# Patient Record
Sex: Male | Born: 1950 | Race: White | Hispanic: No | Marital: Married | Smoking: Never smoker
Health system: Southern US, Community
[De-identification: ages and names within clinical notes are randomized; demographics above are authoritative.]

## PROBLEM LIST (undated history)

## (undated) DIAGNOSIS — Z5189 Encounter for other specified aftercare: Secondary | ICD-10-CM

## (undated) DIAGNOSIS — R748 Abnormal levels of other serum enzymes: Secondary | ICD-10-CM

## (undated) DIAGNOSIS — I509 Heart failure, unspecified: Secondary | ICD-10-CM

## (undated) DIAGNOSIS — C61 Malignant neoplasm of prostate: Secondary | ICD-10-CM

## (undated) DIAGNOSIS — I429 Cardiomyopathy, unspecified: Secondary | ICD-10-CM

## (undated) DIAGNOSIS — R0683 Snoring: Secondary | ICD-10-CM

## (undated) DIAGNOSIS — I2510 Atherosclerotic heart disease of native coronary artery without angina pectoris: Secondary | ICD-10-CM

## (undated) DIAGNOSIS — I10 Essential (primary) hypertension: Secondary | ICD-10-CM

## (undated) DIAGNOSIS — I252 Old myocardial infarction: Secondary | ICD-10-CM

## (undated) DIAGNOSIS — Z872 Personal history of diseases of the skin and subcutaneous tissue: Secondary | ICD-10-CM

## (undated) DIAGNOSIS — K9289 Other specified diseases of the digestive system: Secondary | ICD-10-CM

## (undated) DIAGNOSIS — L910 Hypertrophic scar: Secondary | ICD-10-CM

## (undated) DIAGNOSIS — E785 Hyperlipidemia, unspecified: Secondary | ICD-10-CM

## (undated) DIAGNOSIS — I213 ST elevation (STEMI) myocardial infarction of unspecified site: Secondary | ICD-10-CM

## (undated) DIAGNOSIS — N529 Male erectile dysfunction, unspecified: Secondary | ICD-10-CM

## (undated) DIAGNOSIS — D225 Melanocytic nevi of trunk: Secondary | ICD-10-CM

## (undated) DIAGNOSIS — D649 Anemia, unspecified: Secondary | ICD-10-CM

## (undated) DIAGNOSIS — L570 Actinic keratosis: Secondary | ICD-10-CM

## (undated) DIAGNOSIS — D229 Melanocytic nevi, unspecified: Secondary | ICD-10-CM

## (undated) DIAGNOSIS — I499 Cardiac arrhythmia, unspecified: Secondary | ICD-10-CM

## (undated) DIAGNOSIS — Z950 Presence of cardiac pacemaker: Secondary | ICD-10-CM

## (undated) HISTORY — DX: Anemia, unspecified: D64.9

## (undated) HISTORY — DX: Actinic keratosis: L57.0

## (undated) HISTORY — DX: Melanocytic nevi, unspecified: D22.9

## (undated) HISTORY — DX: Melanocytic nevi of trunk: D22.5

## (undated) HISTORY — DX: Cardiac arrhythmia, unspecified: I49.9

## (undated) HISTORY — DX: Male erectile dysfunction, unspecified: N52.9

## (undated) HISTORY — PX: ICD IMPLANT: EP1208

## (undated) HISTORY — DX: Hyperlipidemia, unspecified: E78.5

## (undated) HISTORY — DX: Encounter for other specified aftercare: Z51.89

## (undated) HISTORY — DX: Cardiomyopathy, unspecified: I42.9

## (undated) HISTORY — DX: Atherosclerotic heart disease of native coronary artery without angina pectoris: I25.10

## (undated) HISTORY — DX: Hypertrophic scar: L91.0

## (undated) HISTORY — DX: Essential (primary) hypertension: I10

## (undated) HISTORY — DX: Heart failure, unspecified: I50.9

## (undated) HISTORY — DX: Abnormal levels of other serum enzymes: R74.8

---
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
## 2009-11-12 DIAGNOSIS — Z8546 Personal history of malignant neoplasm of prostate: Secondary | ICD-10-CM | POA: Insufficient documentation

## 2011-02-10 DIAGNOSIS — E559 Vitamin D deficiency, unspecified: Secondary | ICD-10-CM | POA: Insufficient documentation

## 2011-03-07 DIAGNOSIS — C61 Malignant neoplasm of prostate: Secondary | ICD-10-CM

## 2011-03-07 HISTORY — PX: PROSTATECTOMY: SHX69

## 2011-03-07 HISTORY — DX: Malignant neoplasm of prostate: C61

## 2013-04-16 DIAGNOSIS — M109 Gout, unspecified: Secondary | ICD-10-CM | POA: Insufficient documentation

## 2014-03-06 DIAGNOSIS — I213 ST elevation (STEMI) myocardial infarction of unspecified site: Secondary | ICD-10-CM

## 2014-03-06 HISTORY — DX: ST elevation (STEMI) myocardial infarction of unspecified site: I21.3

## 2014-09-10 DIAGNOSIS — Z8674 Personal history of sudden cardiac arrest: Secondary | ICD-10-CM | POA: Insufficient documentation

## 2014-09-14 DIAGNOSIS — I10 Essential (primary) hypertension: Secondary | ICD-10-CM | POA: Insufficient documentation

## 2014-09-14 DIAGNOSIS — Z955 Presence of coronary angioplasty implant and graft: Secondary | ICD-10-CM | POA: Insufficient documentation

## 2015-02-14 DIAGNOSIS — D649 Anemia, unspecified: Secondary | ICD-10-CM | POA: Insufficient documentation

## 2015-08-25 DIAGNOSIS — E785 Hyperlipidemia, unspecified: Secondary | ICD-10-CM | POA: Insufficient documentation

## 2015-08-25 DIAGNOSIS — I429 Cardiomyopathy, unspecified: Secondary | ICD-10-CM | POA: Insufficient documentation

## 2015-08-25 DIAGNOSIS — I5021 Acute systolic (congestive) heart failure: Secondary | ICD-10-CM | POA: Insufficient documentation

## 2015-08-25 DIAGNOSIS — I2510 Atherosclerotic heart disease of native coronary artery without angina pectoris: Secondary | ICD-10-CM | POA: Insufficient documentation

## 2015-10-13 DIAGNOSIS — Z95810 Presence of automatic (implantable) cardiac defibrillator: Secondary | ICD-10-CM | POA: Insufficient documentation

## 2016-11-20 DIAGNOSIS — R739 Hyperglycemia, unspecified: Secondary | ICD-10-CM | POA: Insufficient documentation

## 2017-03-09 DIAGNOSIS — M109 Gout, unspecified: Secondary | ICD-10-CM

## 2017-03-09 HISTORY — DX: Gout, unspecified: M10.9

## 2018-08-24 DIAGNOSIS — D1800 Hemangioma unspecified site: Secondary | ICD-10-CM

## 2018-08-24 HISTORY — DX: Hemangioma unspecified site: D18.00

## 2019-03-05 ENCOUNTER — Telehealth: Payer: Self-pay

## 2019-03-05 NOTE — Telephone Encounter (Signed)
NOTES ON FILE FROM DR Gerrit Halls MD [phone]

## 2019-05-08 DIAGNOSIS — L910 Hypertrophic scar: Secondary | ICD-10-CM | POA: Insufficient documentation

## 2019-05-08 NOTE — Progress Notes (Signed)
Cardiology Office Note:    Date:  05/09/2019   ID:  [name], DOB [birth year], MRN [id number]  PCP:  Patient, No Pcp Per  Cardiologist:  No primary care provider on file.   Referring MD: No ref. provider found   Chief Complaint  Patient presents with  . Coronary Artery Disease  . Congestive Heart Failure    History of Present Illness:    Kurt Reilly is a 69 y.o. male with a hx of CAD stent OM/Cfx 05/2015, Anterior MI 07/2015 with 100% LAD and cardiogenic shock, chronic systolic heart failure, ischemic cardiomyopathy, cardiac arrest VF 07/2014, 07/2015, ICD (subcutaneous 2017), hyperlipidemia, hypertension, and erectile dysfunction.   Acute anterior ST elevation MI treated with stent Jul 30, 2014 in the setting of ventricular fibrillation and cardiogenic shock.  Cardiac cath June 2016 all nonculprit complex circumflex disease and ostial LAD disease as a staged procedure.  Additional stents were placed in the LAD and throughout the circumflex territory using resolute drug-eluting stents.  Procedure was complicated by cardiogenic shock and required mechanical support.  Subsequent echocardiogram revealed LVEF less than 35% in December 2017 leading to subcu ICD.  Most recent echocardiogram from March 2019 revealed improvement in EF to 40 to 45%  Overall he is happy with his quality of life.  He denies angina.  He is following a low-salt low fluid diet.  He wonders about whether he should still remain on Brilinta.  He has no bleeding complications.  He denies orthopnea, PND, and lower extremity edema.  He does have occasional trace pedal swelling.  He sleeps on a wedge but feels he can sleep flat without any difficulty.  He has never had therapy from his subcutaneous AICD.  He complains about a purplish discoloration of his left great toe.  At times it feels cool.  No sore or other complaint.  He exercises 1 hour 5 days a week.  No difficulty with leg pain or weakness.  Past Medical History:    Diagnosis Date  . Actinic keratosis   . Alkaline phosphatase raised   . Anemia   . CAD (coronary artery disease)   . Cardiac arrhythmia   . Cardiomyopathy ([place])   . CHF (congestive heart failure) ([place])   . Coronary arteriosclerosis   . ED (erectile dysfunction)   . Gastrointestinal irritation   . Gout 03/09/2017  . Hemangioma 08/24/2018  . History of actinic keratosis   . History of myocardial infarction   . Hyperlipidemia   . Hypertension   . Hypertrophic scar   . Malignant neoplasm of prostate ([place])   . Melanocytic nevi of trunk   . Melanocytic nevus of skin   . Pacemaker   . Senile hyperkeratosis   . Snoring   . STEMI (ST elevation myocardial infarction) [facility])     Past Surgical History:  Procedure Laterality Date  . ICD IMPLANT      Current Medications: Current Meds  Medication Sig  . Alirocumab (PRALUENT) 150 MG/ML SOAJ Inject into the skin.  Marland Kitchen allopurinol (ZYLOPRIM) 300 MG tablet Take 300 mg by mouth daily.  . Aspirin Buf,CaCarb-MgCarb-MgO, 81 MG TABS Take 81 mg by mouth daily.  . B Complex-C (B-COMPLEX WITH VITAMIN C) tablet Take 1 tablet by mouth daily.  . carvedilol (COREG) 6.25 MG tablet Take 6.25 mg by mouth 2 (two) times daily with a meal.  . Cholecalciferol (VITAMIN D-3) 25 MCG (1000 UT) CAPS Take by mouth.  . Colchicine (MITIGARE) 0.6 MG CAPS Take 0.6 mg by  mouth as needed.  . ferrous sulfate 325 (65 FE) MG tablet Take 325 mg by mouth daily with breakfast.  . losartan (COZAAR) 50 MG tablet Take 50 mg by mouth daily.  Marland Kitchen MILK THISTLE PO Take 525 mg by mouth.  . nitroGLYCERIN (NITROSTAT) 0.4 MG SL tablet Place 0.4 mg under the tongue every 5 (five) minutes as needed for chest pain.  Marland Kitchen spironolactone (ALDACTONE) 25 MG tablet Take 25 mg by mouth daily.  . ticagrelor (BRILINTA) 60 MG TABS tablet Take by mouth every 12 (twelve) hours.     Allergies:   Ezetimibe, Statins, and Penicillins   Social History   Socioeconomic History  . Marital status: Married     Spouse name: Not on file  . Number of children: Not on file  . Years of education: Not on file  . Highest education level: Not on file  Occupational History  . Not on file  Tobacco Use  . Smoking status: Never Smoker  . Smokeless tobacco: Never Used  Substance and Sexual Activity  . Alcohol use: Yes  . Drug use: Not on file  . Sexual activity: Not on file  Other Topics Concern  . Not on file  Social History Narrative  . Not on file   Social Determinants of Health   Financial Resource Strain:   . Difficulty of Paying Living Expenses: Not on file  Food Insecurity:   . Worried About Charity fundraiser in the Last Year: Not on file  . Ran Out of Food in the Last Year: Not on file  Transportation Needs:   . Lack of Transportation (Medical): Not on file  . Lack of Transportation (Non-Medical): Not on file  Physical Activity:   . Days of Exercise per Week: Not on file  . Minutes of Exercise per Session: Not on file  Stress:   . Feeling of Stress : Not on file  Social Connections:   . Frequency of Communication with Friends and Family: Not on file  . Frequency of Social Gatherings with Friends and Family: Not on file  . Attends Religious Services: Not on file  . Active Member of Clubs or Organizations: Not on file  . Attends Archivist Meetings: Not on file  . Marital Status: Not on file     Family History: The patient's family history includes Cancer in his father; High Cholesterol in his father; Hypertension in his father; Migraines in his son; Yves Dill Parkinson White syndrome in his son.  ROS:   Please see the history of present illness.    He moved to [place] because he has children that live in [place].  He has enjoyed being here.  He is originally from [place].  After having his defibrillator placed he retired from work as an Clinical biochemist.  All other systems reviewed and are negative.  EKGs/Labs/Other Studies Reviewed:    The  following studies were reviewed today:  ECHOCARDIOGRAM 2017:  LVEF 40 to 45% December 2017 with anteroseptal wall motion abnormality.    Mild mitral regurgitation.  Aortic valve sclerosis  udy were communicated with the ordering physician at 10:12AM, 09/02/2014  EKG:  EKG on 05/09/2019, there is sinus bradycardia, poor R wave progression V1 through V4, decreased limb lead voltage.  No acute ST-T wave change and no prior tracing to compare.  Recent Labs: No results found for requested labs within last 8760 hours.  Recent Lipid Panel No results found for: CHOL, TRIG, HDL, CHOLHDL, VLDL, LDLCALC, LDLDIRECT  Physical Exam:    VS:  BP 128/68   Pulse (!) 56   Ht 5\' 6"  (1.676 m)   Wt 160 lb 12.8 oz (72.9 kg)   SpO2 97%   BMI 25.95 kg/m     Wt Readings from Last 3 Encounters:  05/09/19 160 lb 12.8 oz (72.9 kg)     GEN: Wearing a mask.  Not overweight.. No acute distress HEENT: Normal NECK: No JVD. LYMPHATICS: No lymphadenopathy CARDIAC: 1/6 to 2/6 right upper sternal border systolic murmur RRR without diastolic murmur, gallop, or edema. VASCULAR: Posterior tibial pulses are intact.   RESPIRATORY:  Clear to auscultation without rales, wheezing or rhonchi  ABDOMEN: Soft, non-tender, non-distended, No pulsatile mass, MUSCULOSKELETAL: No deformity  SKIN: Warm and dry NEUROLOGIC:  Alert and oriented x 3 PSYCHIATRIC:  Normal affect   ASSESSMENT:    1. Chronic systolic heart failure ([place])   2. Coronary artery disease involving native coronary artery of native heart without angina pectoris   3. ICD (implantable cardioverter-defibrillator), single, in situ   4. Essential hypertension   5. Hyperlipidemia LDL goal <70   6. Cardiomyopathy, ischemic   7. Educated about COVID-19 virus infection   8. Toe cyanosis    PLAN:    In order of problems listed above:  1. Most recently reported EF is 40 to 45%.  Last study to assess LV function was 2017.  He is on  carvedilol/losartan/spironolactone.  We need to repeat an echo to see what current LV function exists. 2. Secondary prevention discussed. 3. We will set up with an EP within our practice. 4. 130/80 mmHg or less is the target.  This is being achieved on current therapy as noted above.  This should be continued. Low-salt diet and exercise advocated. 5. DL less than 70.  He is on Praluent which will be continued and he will be referred to the lipid clinic 6. Assess LVEF 7. COVID-19 vaccine is endorsed.  Social distancing, mask wearing, and handwashing should continue. 8. Bilateral lower extremity Doppler study to exclude PAD given discoloration in the left great toe.  No overt evidence of claudication on exam. 9. Continue Praluent.  Set up in the lipid clinic with our Pharm.D.'s.  Overall education and awareness concerning primary/secondary risk prevention was discussed in detail: LDL less than 70, hemoglobin A1c less than 7, blood pressure target less than 130/80 mmHg, >150 minutes of moderate aerobic activity per week, avoidance of smoking, weight control (via diet and exercise), and continued surveillance/management of/for obstructive sleep apnea.     Medication Adjustments/Labs and Tests Ordered: Current medicines are reviewed at length with the patient today.  Concerns regarding medicines are outlined above.  Orders Placed This Encounter  Procedures  . Ambulatory referral to Cardiac Electrophysiology  . AMB Referral to [facility]  . MYOCARDIAL PERFUSION IMAGING  . EKG 12-Lead  . ECHOCARDIOGRAM COMPLETE  . VAS [place] LOWER EXTREMITY ARTERIAL DUPLEX   No orders of the defined types were placed in this encounter.   Patient Instructions  Medication Instructions:  Your physician recommends that you continue on your current medications as directed. Please refer to the Current Medication list given to you today.  *If you need a refill on your cardiac medications before  your next appointment, please call your pharmacy*   Lab Work: None If you have labs (blood work) drawn today and your tests are completely normal, you will receive your results only by: Marland Kitchen MyChart Message (if you have MyChart)  OR . A paper copy in the mail If you have any lab test that is abnormal or we need to change your treatment, we will call you to review the results.   Testing/Procedures: Your physician has requested that you have an echocardiogram. Echocardiography is a painless test that uses sound waves to create images of your heart. It provides your doctor with information about the size and shape of your heart and how well your heart's chambers and valves are working. This procedure takes approximately one hour. There are no restrictions for this procedure.  Your physician has requested that you have a lexiscan myoview. For further information please visit [email or web address]. Please follow instruction sheet, as given.  Your physician has requested that you have a lower extremity arterial duplex. This test is an ultrasound of the arteries in the legs or arms. It looks at arterial blood flow in the legs and arms. Allow one hour for Lower Arterial scans. There are no restrictions or special instructions    Follow-Up: At [facility], you and your health needs are our priority.  As part of our continuing mission to provide you with exceptional heart care, we have created designated Provider Care Teams.  These Care Teams include your primary Cardiologist (physician) and Advanced Practice Providers (APPs -  Physician Assistants and Nurse Practitioners) who all work together to provide you with the care you need, when you need it.  We recommend signing up for the patient portal called "MyChart".  Sign up information is provided on this After Visit Summary.  MyChart is used to connect with patients for Virtual Visits (Telemedicine).  Patients are able to view lab/test results, encounter  notes, upcoming appointments, etc.  Non-urgent messages can be sent to your provider as well.   To learn more about what you can do with MyChart, go to [email or web address].    Your next appointment:   6 month(s)  The format for your next appointment:   In Person  Provider:   You may see Dr. Daneen Schick or one of the following Advanced Practice Providers on your designated Care Team:    Truitt Merle, NP  Cecilie Kicks, NP  Kathyrn Drown, NP    Other Instructions      Signed, Sinclair Grooms, MD  05/09/2019 11:15 AM    [place]

## 2019-05-09 ENCOUNTER — Other Ambulatory Visit: Payer: Self-pay | Admitting: Interventional Cardiology

## 2019-05-09 ENCOUNTER — Encounter: Payer: Self-pay | Admitting: *Deleted

## 2019-05-09 ENCOUNTER — Encounter: Payer: Self-pay | Admitting: Interventional Cardiology

## 2019-05-09 ENCOUNTER — Ambulatory Visit: Payer: 59 | Admitting: Interventional Cardiology

## 2019-05-09 ENCOUNTER — Other Ambulatory Visit: Payer: Self-pay

## 2019-05-09 VITALS — BP 128/68 | HR 56 | Ht 66.0 in | Wt 160.8 lb

## 2019-05-09 DIAGNOSIS — I5022 Chronic systolic (congestive) heart failure: Secondary | ICD-10-CM

## 2019-05-09 DIAGNOSIS — I2510 Atherosclerotic heart disease of native coronary artery without angina pectoris: Secondary | ICD-10-CM

## 2019-05-09 DIAGNOSIS — Z7189 Other specified counseling: Secondary | ICD-10-CM

## 2019-05-09 DIAGNOSIS — Z95810 Presence of automatic (implantable) cardiac defibrillator: Secondary | ICD-10-CM

## 2019-05-09 DIAGNOSIS — R230 Cyanosis: Secondary | ICD-10-CM

## 2019-05-09 DIAGNOSIS — E785 Hyperlipidemia, unspecified: Secondary | ICD-10-CM

## 2019-05-09 DIAGNOSIS — I10 Essential (primary) hypertension: Secondary | ICD-10-CM | POA: Diagnosis not present

## 2019-05-09 DIAGNOSIS — I255 Ischemic cardiomyopathy: Secondary | ICD-10-CM

## 2019-05-09 NOTE — Patient Instructions (Signed)
Medication Instructions:  Your physician recommends that you continue on your current medications as directed. Please refer to the Current Medication list given to you today.  *If you need a refill on your cardiac medications before your next appointment, please call your pharmacy*   Lab Work: None If you have labs (blood work) drawn today and your tests are completely normal, you will receive your results only by: Marland Kitchen MyChart Message (if you have MyChart) OR . A paper copy in the mail If you have any lab test that is abnormal or we need to change your treatment, we will call you to review the results.   Testing/Procedures: Your physician has requested that you have an echocardiogram. Echocardiography is a painless test that uses sound waves to create images of your heart. It provides your doctor with information about the size and shape of your heart and how well your heart's chambers and valves are working. This procedure takes approximately one hour. There are no restrictions for this procedure.  Your physician has requested that you have a lexiscan myoview. For further information please visit [email or web address]. Please follow instruction sheet, as given.  Your physician has requested that you have a lower extremity arterial duplex. This test is an ultrasound of the arteries in the legs or arms. It looks at arterial blood flow in the legs and arms. Allow one hour for Lower Arterial scans. There are no restrictions or special instructions    Follow-Up: At [facility], you and your health needs are our priority.  As part of our continuing mission to provide you with exceptional heart care, we have created designated Provider Care Teams.  These Care Teams include your primary Cardiologist (physician) and Advanced Practice Providers (APPs -  Physician Assistants and Nurse Practitioners) who all work together to provide you with the care you need, when you need it.  We recommend signing up  for the patient portal called "MyChart".  Sign up information is provided on this After Visit Summary.  MyChart is used to connect with patients for Virtual Visits (Telemedicine).  Patients are able to view lab/test results, encounter notes, upcoming appointments, etc.  Non-urgent messages can be sent to your provider as well.   To learn more about what you can do with MyChart, go to [email or web address].    Your next appointment:   6 month(s)  The format for your next appointment:   In Person  Provider:   You may see Dr. Daneen Schick or one of the following Advanced Practice Providers on your designated Care Team:    Truitt Merle, NP  Cecilie Kicks, NP  Kathyrn Drown, NP    Other Instructions

## 2019-05-13 ENCOUNTER — Ambulatory Visit: Payer: 59 | Admitting: Internal Medicine

## 2019-05-13 ENCOUNTER — Encounter: Payer: Self-pay | Admitting: Internal Medicine

## 2019-05-13 ENCOUNTER — Other Ambulatory Visit: Payer: Self-pay

## 2019-05-13 VITALS — BP 128/68 | HR 65 | Ht 66.0 in | Wt 152.0 lb

## 2019-05-13 DIAGNOSIS — I469 Cardiac arrest, cause unspecified: Secondary | ICD-10-CM | POA: Insufficient documentation

## 2019-05-13 DIAGNOSIS — I5022 Chronic systolic (congestive) heart failure: Secondary | ICD-10-CM | POA: Diagnosis not present

## 2019-05-13 DIAGNOSIS — Z95810 Presence of automatic (implantable) cardiac defibrillator: Secondary | ICD-10-CM | POA: Diagnosis not present

## 2019-05-13 DIAGNOSIS — I428 Other cardiomyopathies: Secondary | ICD-10-CM | POA: Insufficient documentation

## 2019-05-13 DIAGNOSIS — I4901 Ventricular fibrillation: Secondary | ICD-10-CM | POA: Insufficient documentation

## 2019-05-13 DIAGNOSIS — I255 Ischemic cardiomyopathy: Secondary | ICD-10-CM | POA: Diagnosis not present

## 2019-05-13 LAB — CUP PACEART INCLINIC DEVICE CHECK
Date Time Interrogation Session: 20210309094243
Implantable Lead Implant Date: 20170727
Implantable Lead Location: 753862
Implantable Lead Model: 3401
Implantable Pulse Generator Implant Date: 20170727
Pulse Gen Serial Number: 203970

## 2019-05-13 NOTE — Progress Notes (Signed)
HPI Kurt Reilly is referred today for ongoing evaluation and management of his S-ICD. He is a pleasant 69 yo man with a VF arrest in the setting of an AMI in 2016. He has had an amazing recovery. His EF was initially in the 30% range but has increased into the 40% range. He feels well. He walks 1 hour daily. He has not had an ICD shock. He describes want sounds like a single episode of CHF.  Allergies  Allergen Reactions  . Ezetimibe Other (See Comments)  . Statins Other (See Comments)    Myalgia  . Penicillins Rash    Occurred as a child - tolerated Zosyn June 2016      Current Outpatient Medications  Medication Sig Dispense Refill  . Alirocumab (PRALUENT) 150 MG/ML SOAJ Inject into the skin.    Marland Kitchen allopurinol (ZYLOPRIM) 300 MG tablet Take 300 mg by mouth daily.    . Aspirin Buf,CaCarb-MgCarb-MgO, 81 MG TABS Take 81 mg by mouth daily.    . B Complex-C (B-COMPLEX WITH VITAMIN C) tablet Take 1 tablet by mouth daily.    . carvedilol (COREG) 6.25 MG tablet Take 6.25 mg by mouth 2 (two) times daily with a meal.    . Cholecalciferol (VITAMIN D-3) 25 MCG (1000 UT) CAPS Take by mouth.    . Colchicine (MITIGARE) 0.6 MG CAPS Take 0.6 mg by mouth as needed.    . ferrous sulfate 325 (65 FE) MG tablet Take 325 mg by mouth daily with breakfast.    . losartan (COZAAR) 50 MG tablet Take 50 mg by mouth daily.    Marland Kitchen MILK THISTLE PO Take 525 mg by mouth.    . nitroGLYCERIN (NITROSTAT) 0.4 MG SL tablet Place 0.4 mg under the tongue every 5 (five) minutes as needed for chest pain.    Marland Kitchen spironolactone (ALDACTONE) 25 MG tablet Take 25 mg by mouth daily.    . ticagrelor (BRILINTA) 60 MG TABS tablet Take by mouth every 12 (twelve) hours.     No current facility-administered medications for this visit.     Past Medical History:  Diagnosis Date  . Actinic keratosis   . Alkaline phosphatase raised   . Anemia   . CAD (coronary artery disease)   . Cardiac arrhythmia   . Cardiomyopathy ([place])   . CHF  (congestive heart failure) ([place])   . Coronary arteriosclerosis   . ED (erectile dysfunction)   . Gastrointestinal irritation   . Gout 03/09/2017  . Hemangioma 08/24/2018  . History of actinic keratosis   . History of myocardial infarction   . Hyperlipidemia   . Hypertension   . Hypertrophic scar   . Malignant neoplasm of prostate ([place])   . Melanocytic nevi of trunk   . Melanocytic nevus of skin   . Pacemaker   . Senile hyperkeratosis   . Snoring   . STEMI (ST elevation myocardial infarction) ([place])     ROS:   All systems reviewed and negative except as noted in the HPI.   Past Surgical History:  Procedure Laterality Date  . ICD IMPLANT       Family History  Problem Relation Age of Onset  . Cancer Father        gastric  . Hypertension Father   . High Cholesterol Father   . Yves Dill Parkinson White syndrome Son   . Migraines Son      Social History   Socioeconomic History  . Marital status: Married  Spouse name: Not on file  . Number of children: Not on file  . Years of education: Not on file  . Highest education level: Not on file  Occupational History  . Not on file  Tobacco Use  . Smoking status: Never Smoker  . Smokeless tobacco: Never Used  Substance and Sexual Activity  . Alcohol use: Yes  . Drug use: Not on file  . Sexual activity: Not on file  Other Topics Concern  . Not on file  Social History Narrative  . Not on file   Social Determinants of Health   Financial Resource Strain:   . Difficulty of Paying Living Expenses: Not on file  Food Insecurity:   . Worried About Charity fundraiser in the Last Year: Not on file  . Ran Out of Food in the Last Year: Not on file  Transportation Needs:   . Lack of Transportation (Medical): Not on file  . Lack of Transportation (Non-Medical): Not on file  Physical Activity:   . Days of Exercise per Week: Not on file  . Minutes of Exercise per Session: Not on file  Stress:   . Feeling of Stress : Not on  file  Social Connections:   . Frequency of Communication with Friends and Family: Not on file  . Frequency of Social Gatherings with Friends and Family: Not on file  . Attends Religious Services: Not on file  . Active Member of Clubs or Organizations: Not on file  . Attends Archivist Meetings: Not on file  . Marital Status: Not on file  Intimate Partner Violence:   . Fear of Current or Ex-Partner: Not on file  . Emotionally Abused: Not on file  . Physically Abused: Not on file  . Sexually Abused: Not on file     BP 128/68   Pulse 65   Ht 5\' 6"  (1.676 m)   Wt 152 lb (68.9 kg)   SpO2 99%   BMI 24.53 kg/m   Physical Exam:  Well appearing NAD HEENT: Unremarkable Neck:  No JVD, no thyromegally Lymphatics:  No adenopathy Back:  No CVA tenderness Lungs:  Clear with no wheezes HEART:  Regular rate rhythm, no murmurs, no rubs, no clicks Abd:  soft, positive bowel sounds, no organomegally, no rebound, no guarding Ext:  2 plus pulses, no edema, no cyanosis, no clubbing Skin:  No rashes no nodules Neuro:  CN II through XII intact, motor grossly intact  DEVICE  Normal device function.  See PaceArt for details.   Assess/Plan: 1. Chronic systolic heart failure - his symptoms are class 2. He will continue his current meds.  2. CAD - he denies anginal symptoms and he is walking without chest pain. 3. S-ICD - his [employer] S-ICD is working normally. He has over a year of battery longevity and he has had no recurrent ventricular arrhythmias.  Mikle [email or web address].D.

## 2019-05-13 NOTE — Patient Instructions (Signed)
Medication Instructions:  Your physician recommends that you continue on your current medications as directed. Please refer to the Current Medication list given to you today.  Labwork: None ordered.  Testing/Procedures: None ordered.  Follow-Up: Your physician wants you to follow-up in: one year with Dr. Lovena Le.   You will receive a reminder letter in the mail two months in advance. If you don't receive a letter, please call our office to schedule the follow-up appointment.  Remote monitoring is used to monitor your ICD from home. This monitoring reduces the number of office visits required to check your device to one time per year. It allows [place] to keep an eye on the functioning of your device to ensure it is working properly. You are scheduled for a device check from home on 08/08/2019. You may send your transmission at any time that day. If you have a wireless device, the transmission will be sent automatically. After your physician reviews your transmission, you will receive a postcard with your next transmission date.  Any Other Special Instructions Will Be Listed Below (If Applicable).  If you need a refill on your cardiac medications before your next appointment, please call your pharmacy.

## 2019-05-14 ENCOUNTER — Ambulatory Visit (HOSPITAL_COMMUNITY): Payer: 59

## 2019-05-27 ENCOUNTER — Encounter (HOSPITAL_COMMUNITY): Payer: 59

## 2019-05-27 ENCOUNTER — Other Ambulatory Visit (HOSPITAL_COMMUNITY): Payer: 59

## 2019-05-27 ENCOUNTER — Ambulatory Visit: Payer: 59

## 2019-06-11 ENCOUNTER — Telehealth (HOSPITAL_COMMUNITY): Payer: Self-pay | Admitting: *Deleted

## 2019-06-11 ENCOUNTER — Encounter (HOSPITAL_COMMUNITY): Payer: Self-pay | Admitting: *Deleted

## 2019-06-11 NOTE — Telephone Encounter (Signed)
Patient given detailed instructions per Myocardial Perfusion Study Information Sheet for the test on 06/18/2019 at 0730. Patient notified to arrive 15 minutes early and that it is imperative to arrive on time for appointment to keep from having the test rescheduled.  If you need to cancel or reschedule your appointment, please call the office within 24 hours of your appointment. . Patient verbalized understanding.Kurt Reilly, [name] Mychart sent with instructions.

## 2019-06-17 ENCOUNTER — Other Ambulatory Visit: Payer: Self-pay | Admitting: Interventional Cardiology

## 2019-06-18 ENCOUNTER — Ambulatory Visit (INDEPENDENT_AMBULATORY_CARE_PROVIDER_SITE_OTHER): Payer: 59 | Admitting: Pharmacist

## 2019-06-18 ENCOUNTER — Encounter (HOSPITAL_COMMUNITY): Payer: 59

## 2019-06-18 ENCOUNTER — Encounter: Payer: Self-pay | Admitting: Pharmacist

## 2019-06-18 ENCOUNTER — Telehealth: Payer: Self-pay

## 2019-06-18 ENCOUNTER — Other Ambulatory Visit (HOSPITAL_COMMUNITY): Payer: 59

## 2019-06-18 ENCOUNTER — Other Ambulatory Visit: Payer: Self-pay

## 2019-06-18 DIAGNOSIS — E785 Hyperlipidemia, unspecified: Secondary | ICD-10-CM | POA: Diagnosis not present

## 2019-06-18 NOTE — Progress Notes (Signed)
Patient ID: Kurt Reilly                 DOB: Oct 24, 1950                    MRN: [id number]     HPI: Kurt Reilly is a 69 y.o. male patient referred to lipid clinic by Dr Tamala Julian. PMH is significant for hyperlipidemia,  CAD s/p stent placement, HFrEF of 35% on Dec/2017 imporved to 40% by March 2019 and hypertension. Patient report starting Praluent 150mg  over 2 years ago with last renewal Spring 2020 and LDL < 70mg /dL. No laboratory results available at this time. Noted failed trial to multiple statins including atorvastatin, rosuvastatin and simvastatin.  Current Medications:  Praluent 150mg  every 14 days  Intolerances: (Severe muscle pain ,sore legs and difficulty ambulating) Rosuvastatin 10mg  daily Atorvastatin simvastatin  LDL goal: < 70mg /dL  Diet: watching fat intake, salt intake and increased taker intake, amlost all out dairy  Exercise:  trail mill or stationary bike for 1 hr 6x/day  Family History: The patient's family history includes Cancer in his father; High Cholesterol in his father; Hypertension in his father; Migraines in his son; Yves Dill Parkinson White syndrome in his son.  Social History: denies tobacco use, occasional 1 or 2 glasses per week  Labs: non available today  Past Medical History:  Diagnosis Date  . Actinic keratosis   . Alkaline phosphatase raised   . Anemia   . CAD (coronary artery disease)   . Cardiac arrhythmia   . Cardiomyopathy ([place])   . CHF (congestive heart failure) ([place])   . Coronary arteriosclerosis   . ED (erectile dysfunction)   . Gastrointestinal irritation   . Gout 03/09/2017  . Hemangioma 08/24/2018  . History of actinic keratosis   . History of myocardial infarction   . Hyperlipidemia   . Hypertension   . Hypertrophic scar   . Malignant neoplasm of prostate ([place])   . Melanocytic nevi of trunk   . Melanocytic nevus of skin   . Pacemaker   . Senile hyperkeratosis   . Snoring   . STEMI (ST elevation myocardial infarction) [facility])      Current Outpatient Medications on File Prior to Visit  Medication Sig Dispense Refill  . Alirocumab (PRALUENT) 150 MG/ML SOAJ Inject into the skin.    Marland Kitchen allopurinol (ZYLOPRIM) 300 MG tablet Take 300 mg by mouth daily.    . Aspirin Buf,CaCarb-MgCarb-MgO, 81 MG TABS Take 81 mg by mouth daily.    . B Complex-C (B-COMPLEX WITH VITAMIN C) tablet Take 1 tablet by mouth daily.    . carvedilol (COREG) 6.25 MG tablet Take 6.25 mg by mouth 2 (two) times daily with a meal.    . Cholecalciferol (VITAMIN D-3) 25 MCG (1000 UT) CAPS Take by mouth.    . Colchicine (MITIGARE) 0.6 MG CAPS Take 0.6 mg by mouth as needed.    . ferrous sulfate 325 (65 FE) MG tablet Take 325 mg by mouth daily with breakfast.    . losartan (COZAAR) 50 MG tablet Take 50 mg by mouth daily.    Marland Kitchen MILK THISTLE PO Take 525 mg by mouth.    . nitroGLYCERIN (NITROSTAT) 0.4 MG SL tablet Place 0.4 mg under the tongue every 5 (five) minutes as needed for chest pain.    Marland Kitchen spironolactone (ALDACTONE) 25 MG tablet Take 25 mg by mouth daily.    . ticagrelor (BRILINTA) 60 MG TABS tablet Take by mouth every 12 (twelve) hours.  No current facility-administered medications on file prior to visit.    Allergies  Allergen Reactions  . Ezetimibe Other (See Comments)  . Statins Other (See Comments)    Myalgia  . Penicillins Rash    Occurred as a child - tolerated Zosyn June 2016     Hyperlipidemia LDL goal <70 Patient currently on Praluent therapy. Recently moved to [facility] from [place] and will need renewal for insurance in 1-2 months. Denies problems with current therapy and records LDL 70mg /dL last year, but no recent lab results provided.   Will continue Praleunt 150mg  every 14 days, and positive lifestyle modifications. Plan to repeat fasting lipid panel tomorrow while at our office @ [facility]. We will proceed with PA renewal once lipid panel resulted and follow up with patient as needed.   Eustace Hur Rodriguez-Guzman PharmD, BCPS, [place] [id number] 06/18/2019 2:22 PM

## 2019-06-18 NOTE — Patient Instructions (Addendum)
Lipid Clinic (pharmacist):  Kurt Reilly/Kristin/[place] [phone]  Your Results:             Goal  Total Cholesterol < 200  Triglycerides < 150  HDL (happy/good cholesterol) > 40  LDL (lousy/bad cholesterol < 70     Medication changes: Continue taking Praluent 150mg  every 14 days   Lab orders: Fasting blood work TOMORROW   Patient Assistance:  The [employer] foundation offers assistance to help pay for medication copays.  They will cover copays for all cholesterol lowering meds, including statins, fibrates, omega-3 oils, ezetimibe, Repatha, Praluent, Nexletol, Nexlizet.  The cards are usually good for $2,500 or 12 months, whichever comes first. 1. Go to healthwellfoundation.org 2. Click on "Apply Now" 3. Answer questions as to whom is applying (patient or representative) 4. Your disease fund will be "hypercholesterolemia - Medicare access" 5. They will ask questions about finances and which medications you are taking for cholesterol 6. When you submit, the approval is usually within minutes.  You will need to print the card information from the site 7. You will need to show this information to your pharmacy, they will bill your Medicare Part D plan first -then bill [employer] Well --for the copay.   You can also call them at [phone], although the hold times can be quite long.   Thank you for choosing [facility]

## 2019-06-18 NOTE — Assessment & Plan Note (Signed)
Patient currently on Praluent therapy. Recently moved to [facility] from [place] and will need renewal for insurance in 1-2 months. Denies problems with current therapy and records LDL 70mg /dL last year, but no recent lab results provided.   Will continue Praleunt 150mg  every 14 days, and positive lifestyle modifications. Plan to repeat fasting lipid panel tomorrow while at our office @ [facility]. We will proceed with PA renewal once lipid panel resulted and follow up with patient as needed.

## 2019-06-18 NOTE — Telephone Encounter (Signed)
Pt called and stated they had labs drawn at [place] lipid doctor [place]. They gave me their contact information and I called and lmomed the dr at [phone] to please fax [place] their lipid labs

## 2019-06-19 ENCOUNTER — Ambulatory Visit (HOSPITAL_COMMUNITY)
Admission: RE | Admit: 2019-06-19 | Discharge: 2019-06-19 | Disposition: A | Discharge status: Home / Self Care | Payer: 59 | Source: Ambulatory Visit | Attending: Cardiology | Admitting: Cardiology

## 2019-06-19 DIAGNOSIS — R230 Cyanosis: Secondary | ICD-10-CM

## 2019-06-23 ENCOUNTER — Telehealth: Payer: Self-pay

## 2019-06-23 DIAGNOSIS — E785 Hyperlipidemia, unspecified: Secondary | ICD-10-CM

## 2019-06-23 NOTE — Telephone Encounter (Signed)
Called to inform the pt that they were denied because the labs needed to be more recent will appeal once they get new lab work completed for cholesterol management.

## 2019-07-09 ENCOUNTER — Telehealth (HOSPITAL_COMMUNITY): Payer: Self-pay | Admitting: *Deleted

## 2019-07-09 NOTE — Telephone Encounter (Signed)
Left message on voicemail per DPR in reference to upcoming appointment scheduled on 07/14/19 with detailed instructions given per Myocardial Perfusion Study Information Sheet for the test. LM to arrive 15 minutes early, and that it is imperative to arrive on time for appointment to keep from having the test rescheduled. If you need to cancel or reschedule your appointment, please call the office within 24 hours of your appointment. Failure to do so may result in a cancellation of your appointment, and a $50 no show fee. Phone number given for call back for any questions. Kirstie Peri

## 2019-07-14 ENCOUNTER — Other Ambulatory Visit: Payer: Self-pay

## 2019-07-14 ENCOUNTER — Ambulatory Visit (HOSPITAL_COMMUNITY): Payer: 59 | Attending: Cardiovascular Disease

## 2019-07-14 ENCOUNTER — Ambulatory Visit (HOSPITAL_BASED_OUTPATIENT_CLINIC_OR_DEPARTMENT_OTHER): Payer: 59

## 2019-07-14 DIAGNOSIS — I2510 Atherosclerotic heart disease of native coronary artery without angina pectoris: Secondary | ICD-10-CM | POA: Diagnosis not present

## 2019-07-14 DIAGNOSIS — I5022 Chronic systolic (congestive) heart failure: Secondary | ICD-10-CM

## 2019-07-14 LAB — MYOCARDIAL PERFUSION IMAGING
LV dias vol: 165 mL (ref 62–150)
LV sys vol: 123 mL
Peak HR: 96 {beats}/min
Rest HR: 59 {beats}/min
SDS: 5
SRS: 23
SSS: 32
TID: 0.99

## 2019-07-14 LAB — ECHOCARDIOGRAM COMPLETE
Height: 66 in
Weight: 2512 oz

## 2019-07-14 MED ORDER — PERFLUTREN LIPID MICROSPHERE
1.0000 mL | INTRAVENOUS | Status: AC | PRN
  Administered 2019-07-14: 2 mL via INTRAVENOUS

## 2019-07-14 MED ORDER — TECHNETIUM TC 99M TETROFOSMIN IV KIT
9.9000 | PACK | Freq: Once | INTRAVENOUS | Status: AC | PRN
  Administered 2019-07-14: 9.9 via INTRAVENOUS
  Filled 2019-07-14: qty 10

## 2019-07-14 MED ORDER — TECHNETIUM TC 99M TETROFOSMIN IV KIT
30.6000 | PACK | Freq: Once | INTRAVENOUS | Status: AC | PRN
  Administered 2019-07-14: 30.6 via INTRAVENOUS
  Filled 2019-07-14: qty 31

## 2019-07-14 MED ORDER — REGADENOSON 0.4 MG/5ML IV SOLN
0.4000 mg | Freq: Once | INTRAVENOUS | Status: AC
  Administered 2019-07-14: 0.4 mg via INTRAVENOUS

## 2019-07-15 ENCOUNTER — Telehealth: Payer: Self-pay | Admitting: *Deleted

## 2019-07-15 ENCOUNTER — Telehealth: Payer: Self-pay

## 2019-07-15 DIAGNOSIS — I5022 Chronic systolic (congestive) heart failure: Secondary | ICD-10-CM

## 2019-07-15 MED ORDER — ENTRESTO 24-26 MG PO TABS: 1.0000 | ORAL_TABLET | Freq: Two times a day (BID) | ORAL | 11 refills | Status: DC

## 2019-07-15 NOTE — Telephone Encounter (Signed)
**Note De-Identified Kamaree Wheatley Obfuscation** I started a Entresto PA through covermymeds. Key: [id number]

## 2019-07-15 NOTE — Telephone Encounter (Signed)
Spoke with pt and wife and reviewed stress test and echo results and recommendations.  Pt agreeable to [facility].  He will come for labs on 07/24/19 and have an office visit on 5/26 with Truitt Merle, NP.  Pt aware to monitor BP and when appropriate to call.

## 2019-07-15 NOTE — Telephone Encounter (Signed)
-----  Message from Belva Crome, MD sent at 07/15/2019  8:49 AM EDT ----- Let the patient know his heart function is markedly decreased compared to the last recorded values from [place].  Ejection fraction is now 25%.  Medication adjustments are needed to preserve LV function.  Stop losartan and start Entresto 24/26 mg p.o. twice daily.  Be met should be performed 7 to 10 days later with an office visit.  This should be with me or an APP A copy will be sent to Patient, No Pcp Per

## 2019-07-21 ENCOUNTER — Other Ambulatory Visit: Payer: Self-pay

## 2019-07-21 DIAGNOSIS — I5022 Chronic systolic (congestive) heart failure: Secondary | ICD-10-CM

## 2019-07-21 DIAGNOSIS — E785 Hyperlipidemia, unspecified: Secondary | ICD-10-CM

## 2019-07-23 NOTE — Progress Notes (Signed)
CARDIOLOGY OFFICE NOTE  Date:  07/30/2019    Kurt Reilly Date of Birth: [birth year] Medical Record #[id number]  PCP:  Patient, No Pcp Per  Cardiologist:  Tamala Julian & Lovena Le   Chief Complaint  Patient presents with  . Follow-up    History of Present Illness: Kurt Reilly is a 69 y.o. male who presents today for a 3 1/2 month check. Seen for Dr. Tamala Julian and Dr. Lovena Le.   He has a known history of CAD with anterior MI/cardiac arrest in May 2016 with 100% LAD with PCI - had associated cardiogenic shock/failure to wean - then had staged PCI to LCX and ostial LAD in June 2016 (total of 2 stents to the LAD) [place], chronic systolic HF, ICM, underlying ICD (subcutaneous 2017), HLD, HTN and ED. EF was less than 35% - this has improved to 40 to 45%.   Last seen back in March by Dr. Tamala Julian - felt to be doing ok - did have a purplish discoloration of the left great toe. ABIs were ok.   The patient does not have symptoms concerning for COVID-19 infection (fever, chills, cough, or new shortness of breath).   Comes in today. Here alone. He says he is feeling great. No chest pain. Not short of breath. Little fatigue. Actually feels like he can think "clearer" with the change over to [facility]. No swelling. Watching his salt. No shocks. Needs PCP and needs urology. He is having issues with ED - has tried Viagra in the past - no real response - wonders if he should see urology - we can refer. Overall, he is happy with how he is doing.   Past Medical History:  Diagnosis Date  . Actinic keratosis   . Alkaline phosphatase raised   . Anemia   . CAD (coronary artery disease)   . Cardiac arrhythmia   . Cardiomyopathy ([place])   . CHF (congestive heart failure) ([place])   . Coronary arteriosclerosis   . ED (erectile dysfunction)   . Gastrointestinal irritation   . Gout 03/09/2017  . Hemangioma 08/24/2018  . History of actinic keratosis   . History of myocardial infarction   . Hyperlipidemia   .  Hypertension   . Hypertrophic scar   . Malignant neoplasm of prostate ([place])   . Melanocytic nevi of trunk   . Melanocytic nevus of skin   . Pacemaker   . Senile hyperkeratosis   . Snoring   . STEMI (ST elevation myocardial infarction) [facility])     Past Surgical History:  Procedure Laterality Date  . ICD IMPLANT       Medications: Current Meds  Medication Sig  . Alirocumab (PRALUENT) 150 MG/ML SOAJ Inject into the skin every 14 (fourteen) days.   Marland Kitchen allopurinol (ZYLOPRIM) 300 MG tablet Take 300 mg by mouth daily.  . Aspirin Buf,CaCarb-MgCarb-MgO, 81 MG TABS Take 81 mg by mouth daily.  . B Complex-C (B-COMPLEX WITH VITAMIN C) tablet Take 1 tablet by mouth daily.  Marland Kitchen BRILINTA 60 MG TABS tablet TAKE 1 TABLET BY MOUTH EVERY 12 HOURS  . carvedilol (COREG) 6.25 MG tablet Take 6.25 mg by mouth 2 (two) times daily with a meal.  . Cholecalciferol (VITAMIN D-3) 25 MCG (1000 UT) CAPS Take by mouth daily.   . Colchicine (MITIGARE) 0.6 MG CAPS Take 0.6 mg by mouth as needed.  . ferrous sulfate 325 (65 FE) MG tablet Take 325 mg by mouth every other day.   Marland Kitchen MILK THISTLE PO Take  525 mg by mouth.  . nitroGLYCERIN (NITROSTAT) 0.4 MG SL tablet Place 0.4 mg under the tongue every 5 (five) minutes as needed for chest pain.  . sacubitril-valsartan (ENTRESTO) 24-26 MG Take 1 tablet by mouth 2 (two) times daily.  Marland Kitchen spironolactone (ALDACTONE) 25 MG tablet Take 25 mg by mouth daily.     Allergies: Allergies  Allergen Reactions  . Ezetimibe Other (See Comments)  . Statins Other (See Comments)    Myalgia  . Penicillins Rash    Occurred as a child - tolerated Zosyn June 2016     Social History: The patient  reports that he has never smoked. He has never used smokeless tobacco. He reports current alcohol use.   Family History: The patient's family history includes Cancer in his father; High Cholesterol in his father; Hypertension in his father; Migraines in his son; Yves Dill Parkinson White syndrome in  his son.   Review of Systems: Please see the history of present illness.   All other systems are reviewed and negative.   Physical Exam: VS:  BP 124/70   Pulse 65   Ht _0  (1.676 m)   Wt 161 lb (73 kg)   SpO2 99%   BMI 25.99 kg/m  .  BMI Body mass index is 25.99 kg/m.  Wt Readings from Last 3 Encounters:  07/30/19 161 lb (73 kg)  07/14/19 157 lb (71.2 kg)  06/18/19 157 lb 3.2 oz (71.3 kg)    General: Pleasant. Alert and in no acute distress.   Neck: Supple, no JVD, carotid bruits, or masses noted.  Cardiac: Regular rate and rhythm. No murmurs, rubs, or gallops. No edema.  Respiratory:  Lungs are clear to auscultation bilaterally with normal work of breathing.  GI: Soft and nontender.  MS: No deformity or atrophy. Gait and ROM intact.  Skin: Warm and dry. Color is normal.  Neuro:  Strength and sensation are intact and no gross focal deficits noted.  Psych: Alert, appropriate and with normal affect.   LABORATORY DATA:  EKG:  EKG is not ordered today.    Lab Results  Component Value Date   CHOL 133 07/24/2019   TRIG 195 (H) 07/24/2019   HDL 39 (L) 07/24/2019   LDLCALC 62 07/24/2019   ALT 22 07/24/2019   AST 21 07/24/2019     BNP (last 3 results) No results for input(s): BNP in the last 8760 hours.  ProBNP (last 3 results) No results for input(s): PROBNP in the last 8760 hours.   Other Studies Reviewed Today:  ECHO IMPRESSIONS 07/2019  1. Left ventricular ejection fraction, by estimation, is 25 to 30%. The  left ventricle has severely decreased function. The left ventricle  demonstrates global hypokinesis. There is mild left ventricular  hypertrophy of the basal-septal segment. Left  ventricular diastolic parameters are consistent with Grade II diastolic  dysfunction (pseudonormalization). Elevated left ventricular end-diastolic  pressure. There is severe hypokinesis of the left ventricular, basal-mid  anteroseptal wall and inferoseptal  wall. There is  severe hypokinesis of the left ventricular, entire  inferior wall. There is akinesis of the left ventricular, apical septal  wall, anterior wall and apical segment.  2. Right ventricular systolic function is normal. The right ventricular  size is normal. There is normal pulmonary artery systolic pressure. The  estimated right ventricular systolic pressure is 26.3 mmHg.  3. Left atrial size was moderately dilated.  4. The mitral valve is normal in structure. Mild mitral valve  regurgitation. No evidence of mitral  stenosis.  5. The aortic valve is tricuspid. Aortic valve regurgitation is not  visualized. Mild aortic valve sclerosis is present, with no evidence of  aortic valve stenosis.  6. The inferior vena cava is normal in size with greater than 50%  respiratory variability, suggesting right atrial pressure of 3 mmHg.   Let the patient know his heart function is markedly decreased compared to the last recorded values from [place]. Ejection fraction is now 25%. Medication adjustments are needed to preserve LV function. Stop losartan and start Entresto 24/26 mg p.o. twice daily. Be met should be performed 7 to 10 days later with an office visit. This should be with me or an APP A copy will be sent to Patient, No Pcp Per   Myoview Study Highlights 07/2019   Nuclear stress EF: 25%. The left ventricular ejection fraction is severely decreased (<30%).  There was no ST segment deviation noted during stress.  Defect 1: There is a large defect of severe severity present in the basal inferoseptal, basal inferior, mid inferoseptal, mid inferior, apical anterior, apical septal, apical inferior, apical lateral and apex location.  Findings consistent with prior anterioapical and inferior wall myocardial infarction.  There is no evidence of ischemia   Belva Crome, MD  07/15/2019 8:39 AM EDT    Let the patient know there are large multiple areas of scar in anterior and inferolateral  walls. Consistent with prior infarctions. Ejection fraction 25%. The low ejection fraction is new (please see the dialogue under the echocardiogram report). A copy will be sent to Patient, No Pcp Per    ASSESSMENT & PLAN:    1. ICM - doing well clinically. Has been transitioned over to [place]. Will try to increase that dose today. On good guideline therapy otherwise. BMET today and repeat in about 10 days. If cost becomes an issue - he is to let us know.   2. Chronic systolic HF - EF is 32% per recent echo - he has been changed from ARB to [facility] - trying to increase that dose today - BMET today. He is NYHA I at this time in regards to symptoms.   3. CAD with prior cardiac arrest/VF/cardiogenic shock - s/p revascularization - today is his five year anniversary - he is doing well clinically. He remains on DAPT - CBC today.   4. Underlying ICD - followed by Dr. Lovena Le -  No shocks.   5. HTN - BP looks good - he will continue to monitor.   6. HLD - on PCKS9 therapy.   7. Prior toe cyanosis - had ABI's - these were ok back in April of 2021.   8. ED - referral to GU  9. Need for PCP - suggestions given.   10. COVID-19 Education: The signs and symptoms of COVID-19 were discussed with the patient and how to seek care for testing (follow up with PCP or arrange E-visit).  The importance of social distancing, staying at home, hand hygiene and wearing a mask when out in public were discussed today.  Current medicines are reviewed with the patient today.  The patient does not have concerns regarding medicines other than what has been noted above.  The following changes have been made:  See above.  Labs/ tests ordered today include:    Orders Placed This Encounter  Procedures  . Basic metabolic panel  . CBC  . Basic metabolic panel  . Ambulatory referral to Urology     Disposition:   FU with  [place] in about 2 months. Lab today. Entresto increased.  Overall he is doing well.    Patient is agreeable to this plan and will call if any problems develop in the interim.   SignedTruitt Merle, NP  07/30/2019 11:27 AM  [facility] [address] [place], [place]  [address] Phone: [phone] Fax: [phone]

## 2019-07-24 ENCOUNTER — Other Ambulatory Visit: Payer: 59 | Admitting: *Deleted

## 2019-07-24 ENCOUNTER — Other Ambulatory Visit: Payer: Self-pay

## 2019-07-24 DIAGNOSIS — E785 Hyperlipidemia, unspecified: Secondary | ICD-10-CM

## 2019-07-24 LAB — HEPATIC FUNCTION PANEL
ALT: 22 IU/L (ref 0–44)
AST: 21 IU/L (ref 0–40)
Albumin: 4.7 g/dL (ref 3.8–4.8)
Alkaline Phosphatase: 118 IU/L (ref 48–121)
Bilirubin Total: 0.5 mg/dL (ref 0.0–1.2)
Bilirubin, Direct: 0.17 mg/dL (ref 0.00–0.40)
Total Protein: 7.3 g/dL (ref 6.0–8.5)

## 2019-07-24 LAB — LIPID PANEL
Chol/HDL Ratio: 3.4 ratio (ref 0.0–5.0)
Cholesterol, Total: 133 mg/dL (ref 100–199)
HDL: 39 mg/dL — ABNORMAL LOW (ref >39)
LDL Chol Calc (NIH): 62 mg/dL (ref 0–99)
Triglycerides: 195 mg/dL — ABNORMAL HIGH (ref 0–149)
VLDL Cholesterol Cal: 32 mg/dL (ref 5–40)

## 2019-07-30 ENCOUNTER — Encounter: Payer: Self-pay | Admitting: Nurse Practitioner

## 2019-07-30 ENCOUNTER — Other Ambulatory Visit: Payer: Self-pay

## 2019-07-30 ENCOUNTER — Ambulatory Visit: Payer: 59 | Admitting: Nurse Practitioner

## 2019-07-30 VITALS — BP 124/70 | HR 65 | Ht 66.0 in | Wt 161.0 lb

## 2019-07-30 DIAGNOSIS — I2510 Atherosclerotic heart disease of native coronary artery without angina pectoris: Secondary | ICD-10-CM

## 2019-07-30 DIAGNOSIS — I5022 Chronic systolic (congestive) heart failure: Secondary | ICD-10-CM | POA: Diagnosis not present

## 2019-07-30 DIAGNOSIS — Z95810 Presence of automatic (implantable) cardiac defibrillator: Secondary | ICD-10-CM

## 2019-07-30 DIAGNOSIS — I469 Cardiac arrest, cause unspecified: Secondary | ICD-10-CM

## 2019-07-30 DIAGNOSIS — I255 Ischemic cardiomyopathy: Secondary | ICD-10-CM | POA: Diagnosis not present

## 2019-07-30 DIAGNOSIS — N528 Other male erectile dysfunction: Secondary | ICD-10-CM | POA: Diagnosis not present

## 2019-07-30 DIAGNOSIS — E785 Hyperlipidemia, unspecified: Secondary | ICD-10-CM

## 2019-07-30 DIAGNOSIS — Z7189 Other specified counseling: Secondary | ICD-10-CM

## 2019-07-30 DIAGNOSIS — I4901 Ventricular fibrillation: Secondary | ICD-10-CM

## 2019-07-30 MED ORDER — ENTRESTO 49-51 MG PO TABS: 1.0000 | ORAL_TABLET | Freq: Two times a day (BID) | ORAL | 6 refills | Status: DC

## 2019-07-30 NOTE — Patient Instructions (Addendum)
After Visit Summary:  We will be checking the following labs today - BMET & CBC  BMET in about 10 days   Medication Instructions:    Continue with your current medicines. BUT  We are going to try and increase the Entresto to 49-51mg  to take twice a day - this is sent to your pharmacy   If you need a refill on your cardiac medications before your next appointment, please call your pharmacy.     Testing/Procedures To Be Arranged:  N/A  Follow-Up:   See Dr. Tamala Julian in 2 months.     At [facility], you and your health needs are our priority.  As part of our continuing mission to provide you with exceptional heart care, we have created designated Provider Care Teams.  These Care Teams include your primary Cardiologist (physician) and Advanced Practice Providers (APPs -  Physician Assistants and Nurse Practitioners) who all work together to provide you with the care you need, when you need it.  Special Instructions:  . Stay safe, stay home, wash your hands for at least 20 seconds and wear a mask when out in public.  . It was good to talk with you today.   To try for primary care - call [facility] on [facility].  Try [facility] [place]) with Dr. Fayrene Fearing group - Mattie Marlin NP We will also give you a list of providers for primary care Referral to Urology has been placed.    Call the [place] office at [phone] if you have any questions, problems or concerns.

## 2019-07-31 ENCOUNTER — Telehealth: Payer: Self-pay

## 2019-07-31 LAB — BASIC METABOLIC PANEL
BUN/Creatinine Ratio: 27 — ABNORMAL HIGH (ref 10–24)
BUN: 29 mg/dL — ABNORMAL HIGH (ref 8–27)
CO2: 20 mmol/L (ref 20–29)
Calcium: 9.8 mg/dL (ref 8.6–10.2)
Chloride: 106 mmol/L (ref 96–106)
Creatinine, Ser: 1.09 mg/dL (ref 0.76–1.27)
GFR calc Af Amer: 81 mL/min/{1.73_m2} (ref >59)
GFR calc non Af Amer: 70 mL/min/{1.73_m2} (ref >59)
Glucose: 99 mg/dL (ref 65–99)
Potassium: 4.8 mmol/L (ref 3.5–5.2)
Sodium: 140 mmol/L (ref 134–144)

## 2019-07-31 LAB — CBC
Hematocrit: 41.7 % (ref 37.5–51.0)
Hemoglobin: 13.9 g/dL (ref 13.0–17.7)
MCH: 33.2 pg — ABNORMAL HIGH (ref 26.6–33.0)
MCHC: 33.3 g/dL (ref 31.5–35.7)
MCV: 100 fL — ABNORMAL HIGH (ref 79–97)
Platelets: 261 10*3/uL (ref 150–450)
RBC: 4.19 x10E6/uL (ref 4.14–5.80)
RDW: 12.6 % (ref 11.6–15.4)
WBC: 7.3 10*3/uL (ref 3.4–10.8)

## 2019-07-31 NOTE — Telephone Encounter (Signed)
The patient has been notified of the lab result and verbalized understanding.  All questions (if any) were answered. Frederik Schmidt, RN 07/31/2019 2:23 PM

## 2019-07-31 NOTE — Telephone Encounter (Signed)
lpmtcb 5/27

## 2019-07-31 NOTE — Telephone Encounter (Signed)
-----   Message from Burtis Junes, NP sent at 07/31/2019  8:32 AM EDT ----- Please report - Andee Poles is out of the office.  His labs are ok.  BUN just up slightly Keep follow up lab as scheduled  For now, no change in current regimen.

## 2019-07-31 NOTE — Telephone Encounter (Signed)
-----   Message from Burtis Junes, NP sent at 07/31/2019  8:32 AM EDT ----- Please report - Kurt Reilly is out of the office.  His labs are ok.  BUN just up slightly Keep follow up lab as scheduled  For now, no change in current regimen.

## 2019-08-06 ENCOUNTER — Telehealth: Payer: Self-pay

## 2019-08-06 NOTE — Telephone Encounter (Signed)
Offered 2 samples of praluent 150mg  pt voiced understanding since they were denied from pa

## 2019-08-08 ENCOUNTER — Telehealth: Payer: Self-pay

## 2019-08-08 ENCOUNTER — Telehealth: Payer: Self-pay | Admitting: Interventional Cardiology

## 2019-08-08 NOTE — Telephone Encounter (Signed)
Pt said he started a new medication on Tuesday, which was 4 days later than he originally planned on. He wanted to know if the appointment for his labs on Monday was enough time to see how the medicine was working or if he needs to reschedule. Please let the patient know what the office decides. If he does not answer please leave a message on his answering machine

## 2019-08-08 NOTE — Telephone Encounter (Signed)
Noted.   Kurt Reilly 

## 2019-08-08 NOTE — Telephone Encounter (Signed)
Labs ordered by Cecille Rubin. I have moved appointment to Thursday 6/10. Left detailed message on patient voicemail per pt request. I will forward to lori as an [place]

## 2019-08-08 NOTE — Telephone Encounter (Signed)
Spoke with patient to remind of missed remote transmission 

## 2019-08-11 ENCOUNTER — Other Ambulatory Visit: Payer: Self-pay | Admitting: Pharmacist

## 2019-08-11 ENCOUNTER — Other Ambulatory Visit: Payer: 59

## 2019-08-11 MED ORDER — PRALUENT 150 MG/ML ~~LOC~~ SOAJ: 150.0000 mg | SUBCUTANEOUS | 11 refills | Status: DC

## 2019-08-12 ENCOUNTER — Telehealth: Payer: Self-pay | Admitting: Internal Medicine

## 2019-08-12 NOTE — Telephone Encounter (Signed)
Prescription approved at the pharmacy and communicated to patient .

## 2019-08-12 NOTE — Telephone Encounter (Signed)
Ayana from [facility] is calling in regards to the patient's Praluent appeal. She states it has been approved and is valid through 08/11/20. The case number for calling back if necessary is [id number]. Please advise.

## 2019-08-14 ENCOUNTER — Other Ambulatory Visit: Payer: Self-pay

## 2019-08-14 ENCOUNTER — Other Ambulatory Visit: Payer: 59 | Admitting: *Deleted

## 2019-08-14 DIAGNOSIS — I5022 Chronic systolic (congestive) heart failure: Secondary | ICD-10-CM

## 2019-08-14 LAB — BASIC METABOLIC PANEL
BUN/Creatinine Ratio: 23 (ref 10–24)
BUN: 27 mg/dL (ref 8–27)
CO2: 22 mmol/L (ref 20–29)
Calcium: 9.6 mg/dL (ref 8.6–10.2)
Chloride: 102 mmol/L (ref 96–106)
Creatinine, Ser: 1.16 mg/dL (ref 0.76–1.27)
GFR calc Af Amer: 75 mL/min/{1.73_m2} (ref >59)
GFR calc non Af Amer: 65 mL/min/{1.73_m2} (ref >59)
Glucose: 108 mg/dL — ABNORMAL HIGH (ref 65–99)
Potassium: 4.6 mmol/L (ref 3.5–5.2)
Sodium: 137 mmol/L (ref 134–144)

## 2019-08-18 ENCOUNTER — Other Ambulatory Visit: Payer: Self-pay | Admitting: *Deleted

## 2019-08-18 MED ORDER — ENTRESTO 49-51 MG PO TABS: 1.0000 | ORAL_TABLET | Freq: Two times a day (BID) | ORAL | 3 refills | Status: DC

## 2019-08-19 ENCOUNTER — Telehealth: Payer: Self-pay

## 2019-08-19 NOTE — Telephone Encounter (Signed)
**Note De-Identified Kurt Reilly Obfuscation** Kurt Reilly Key: [id number] - PA Case ID: [id number] - Rx #: [id number] Need help? Call us at [phone]  Outcome  Approvedon May 11  Request Reference Number: [id number].  ENTRESTO TAB 24-26MG  is approved through 07/14/2020.

## 2019-08-19 NOTE — Telephone Encounter (Addendum)
**Note De-identified Aiden Rao Obfuscation** -----  **Note De-Identified Callaghan Laverdure Obfuscation** Message from Tamsen Snider sent at 08/18/2019 11:54 AM EDT ----- If you can please help pt with assistance for Entresto.

## 2019-08-19 NOTE — Telephone Encounter (Signed)
**Note De-Identified Alexandar Weisenberger Obfuscation** No answer so I left a message on the pts VM asking him to [place] from [facility] back at [phone].

## 2019-08-20 NOTE — Telephone Encounter (Signed)
Follow up  ° ° °Pt is returning call  ° ° °Please call back  °

## 2019-08-20 NOTE — Telephone Encounter (Signed)
**Note De-Identified Neriah Brott Obfuscation** No answer so I left a detailed message on the pts VM (ok per DPR) advising him to call [employer] at [phone] to ask questions concerning his eligibility to be accepted for assistance with his Delene Loll and to request that they mail him an application. I advised that once he receives the application to complete his part, obtain required documentation if any and to bring all to the office to drop off and that we will take care of the provider part of the application and will fax all to [employer] pt [employer] foundation.

## 2019-08-21 NOTE — Telephone Encounter (Addendum)
**Note De-Identified Timtohy Broski Obfuscation** No answer so I left a message on the pts VM asking him to call Jeani Hawking back at [facility] at [phone] to discuss the cost of his Delene Loll.

## 2019-08-21 NOTE — Telephone Encounter (Signed)
**Note De-Identified Kurt Reilly Obfuscation** The pt called me back to let me know that someone at our office gave him a $10 co-pay [place] card and that is working well for him. He thanked me for calling him back.

## 2019-08-21 NOTE — Telephone Encounter (Signed)
Follow up  Patient returning call. Transferred to [facility]

## 2019-08-22 ENCOUNTER — Other Ambulatory Visit: Payer: Self-pay

## 2019-08-25 ENCOUNTER — Ambulatory Visit: Payer: 59 | Admitting: Physician Assistant

## 2019-08-25 ENCOUNTER — Other Ambulatory Visit: Payer: Self-pay

## 2019-08-25 ENCOUNTER — Encounter: Payer: Self-pay | Admitting: Physician Assistant

## 2019-08-25 ENCOUNTER — Telehealth: Payer: Self-pay | Admitting: Physician Assistant

## 2019-08-25 VITALS — BP 116/70 | HR 68 | Temp 98.1°F | Ht 66.0 in | Wt 159.8 lb

## 2019-08-25 DIAGNOSIS — M545 Low back pain, unspecified: Secondary | ICD-10-CM

## 2019-08-25 DIAGNOSIS — G8929 Other chronic pain: Secondary | ICD-10-CM | POA: Diagnosis not present

## 2019-08-25 DIAGNOSIS — M79644 Pain in right finger(s): Secondary | ICD-10-CM

## 2019-08-25 MED ORDER — ALLOPURINOL 300 MG PO TABS: 300.0000 mg | ORAL_TABLET | Freq: Every day | ORAL | 0 refills | Status: DC

## 2019-08-25 NOTE — Telephone Encounter (Signed)
. .    LAST APPOINTMENT DATE: 08/25/2019   NEXT APPOINTMENT DATE:@Visit  date not found  MEDICATION: allopurinon 30 mg   PHARMACY: [facility]  **Let patient know to contact pharmacy at the end of the day to make sure medication is ready. **  ** Please notify patient to allow 48-72 hours to process**  **Encourage patient to contact the pharmacy for refills or they can request refills through [facility]**  CLINICAL FILLS OUT ALL BELOW:   LAST REFILL:  QTY:  REFILL DATE:    OTHER COMMENTS:    Okay for refill?  Please advise

## 2019-08-25 NOTE — Patient Instructions (Signed)
It was great to see you!  We have ordered an x-ray for you. You can walk in at [facility] without a scheduled appointment. The address is [address].  Xray is located in the basement.  Hours of operation are M-F 8:30am to 5:00pm. **closed for lunch between 12:30 and 1:00pm.  I will be in touch with your xray results and the plan.  Let's follow-up in 6 months for a physical, sooner if you have concerns.  Take care,  Inda Coke PA-C

## 2019-08-25 NOTE — Progress Notes (Signed)
Kurt Reilly is a 69 y.o. male is here to establish care.  I acted as a Education administrator for [employer], PA-C [employer], [place]  History of Present Illness:   Chief Complaint  Patient presents with  . New Patient (Initial Visit)    HPI  New Patient Establishing care. Patient moved from [place] in October 2020.  He is s/p MI about 4 years ago. Dx with chronic systolic HF (most recent echo in 2021 showing EF of 25%), sub-q ICD, ICM, HLD, HTN.  He is already established with a local cardiologist, Dr. Daneen Schick.  Low back pain and R thumb pain Pt c/o lower back and right hand muscle ache. He noticed muscle aches after he moved last October. Taking tylenol for pain, which does help some. Pain is more noticeable at night. Has done therapy in the past for other ortho issues and would like to pursue that if possible. He has chronic R thumb pain x a few months. He does wood-working and get pain at times. Denies bowel/bladder incontinence, numbness/tingling down legs. Does have hx of prostate cancer, s/p prostatectomy about 8 years ago.   Health Maintenance Due  Topic Date Due  . Hepatitis C Screening  Never done  . COLONOSCOPY  03/04/2014  . PNA vac Low Risk Adult (1 of 2 - PCV13) Never done    Past Medical History:  Diagnosis Date  . Actinic keratosis   . Alkaline phosphatase raised   . Anemia   . Cardiac arrhythmia   . Cardiomyopathy ([place])   . CHF (congestive heart failure) ([place])   . Coronary arteriosclerosis   . ED (erectile dysfunction)   . Gout 03/09/2017  . Hemangioma 08/24/2018  . Hyperlipidemia   . Hypertension   . Hypertrophic scar   . Malignant neoplasm of prostate ([place]) 2013   Was told that less than 1% of cancer  . Melanocytic nevi of trunk   . Melanocytic nevus of skin   . Senile hyperkeratosis   . STEMI (ST elevation myocardial infarction) ([place]) 2016     Social History   Tobacco Use  . Smoking status: Never Smoker  . Smokeless tobacco: Never Used    Substance Use Topics  . Alcohol use: Yes    Comment: 2 glasses of wine per week  . Drug use: Not on file    Past Surgical History:  Procedure Laterality Date  . ICD IMPLANT    . PROSTATECTOMY  2013    Family History  Problem Relation Age of Onset  . Cancer Father        gastric  . Hypertension Father   . High Cholesterol Father   . Yves Dill Parkinson White syndrome Son   . Migraines Son     PMHx, SurgHx, SocialHx, FamHx, Medications, and Allergies were reviewed in the Visit Navigator and updated as appropriate.   Patient Active Problem List   Diagnosis Date Noted  . Hyperlipidemia LDL goal <70 06/18/2019  . 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 Cardiac arrest with ventricular fibrillation ([place]) 05/13/2019  . 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 Hypertrophic scar     Social History   Tobacco Use  . Smoking status: Never Smoker  . Smokeless tobacco: Never Used  Substance Use Topics  . Alcohol use: Yes    Comment: 2 glasses of wine per week  . Drug use: Not on file    Current Medications and Allergies:    Current Outpatient Medications:  .  Alirocumab ([place])  150 MG/ML SOAJ, Inject 150 mg into the skin every 14 (fourteen) days., Disp: 2 pen, Rfl: 11 .  allopurinol (ZYLOPRIM) 300 MG tablet, Take 300 mg by mouth daily., Disp: , Rfl:  .  Aspirin Buf,CaCarb-MgCarb-MgO, 81 MG TABS, Take 81 mg by mouth daily., Disp: , Rfl:  .  B Complex-C (B-COMPLEX WITH VITAMIN C) tablet, Take 1 tablet by mouth daily., Disp: , Rfl:  .  BRILINTA 60 MG TABS tablet, TAKE 1 TABLET BY MOUTH EVERY 12 HOURS, Disp: 180 tablet, Rfl: 3 .  carvedilol (COREG) 6.25 MG tablet, Take 6.25 mg by mouth 2 (two) times daily with a meal., Disp: , Rfl:  .  Cholecalciferol (VITAMIN D-3) 25 MCG (1000 UT) CAPS, Take by mouth daily. , Disp: , Rfl:  .  Colchicine (MITIGARE) 0.6 MG CAPS, Take 0.6 mg by mouth as needed., Disp: , Rfl:  .  ferrous sulfate 325 (65 FE) MG  tablet, Take 325 mg by mouth every other day. , Disp: , Rfl:  .  losartan (COZAAR) 50 MG tablet, Take 50 mg by mouth daily., Disp: , Rfl:  .  MILK THISTLE PO, Take 525 mg by mouth every other day. , Disp: , Rfl:  .  nitroGLYCERIN (NITROSTAT) 0.4 MG SL tablet, Place 0.4 mg under the tongue every 5 (five) minutes as needed for chest pain., Disp: , Rfl:  .  sacubitril-valsartan (ENTRESTO) 49-51 MG, Take 1 tablet by mouth 2 (two) times daily., Disp: 180 tablet, Rfl: 3 .  spironolactone (ALDACTONE) 25 MG tablet, Take 25 mg by mouth daily., Disp: , Rfl:    Allergies  Allergen Reactions  . Ezetimibe Other (See Comments)  . Statins Other (See Comments)    Myalgia  . Penicillins Rash    Occurred as a child - tolerated Zosyn June 2016     Review of Systems   ROS  Negative unless otherwise specified per HPI.  Vitals:   Vitals:   08/25/19 1325  BP: 116/70  Pulse: 68  Temp: 98.1 F (36.7 C)  TempSrc: Temporal  SpO2: 97%  Weight: 159 lb 12.8 oz (72.5 kg)  Height: 5\' 6"  (1.676 m)     Body mass index is 25.79 kg/m.   Physical Exam:    Physical Exam Vitals and nursing note reviewed.  Constitutional:      General: He is not in acute distress.    Appearance: He is well-developed. He is not ill-appearing or toxic-appearing.  Cardiovascular:     Rate and Rhythm: Normal rate and regular rhythm.     Pulses: Normal pulses.     Heart sounds: Normal heart sounds, S1 normal and S2 normal.     Comments: No LE edema Pulmonary:     Effort: Pulmonary effort is normal.     Breath sounds: Normal breath sounds.  Musculoskeletal:     Comments: Back: No decreased ROM 2/2 pain with flexion/extension, lateral side bends, or rotation. Reproducible tenderness with deep palpation to bilateral paraspinal lumbar muscles. No bony tenderness. No evidence of erythema, rash or ecchymosis.   R hand: Positive Finkelstein's test Normal ROM   Skin:    General: Skin is warm and dry.  Neurological:      Mental Status: He is alert.     GCS: GCS eye subscore is 4. GCS verbal subscore is 5. GCS motor subscore is 6.  Psychiatric:        Speech: Speech normal.        Behavior: Behavior normal. Behavior is cooperative.  Assessment and Plan:    Kurt Reilly was seen today for new patient (initial visit).  Diagnoses and all orders for this visit:  Chronic bilateral low back pain without sciatica Likely muscle spasm, however given chronicity of symptoms, hx prostate cancer, will update xray. If normal, will refer to PT. -     DG Lumbar Spine Complete; Future  Chronic pain of right thumb Discussed possible deQuervains tenosynovitis. He would like to opt for conservative treatment. He will let us know if he would like referral to sports medicine.  . Reviewed expectations re: course of current medical issues. . Discussed self-management of symptoms. . Outlined signs and symptoms indicating need for more acute intervention. . Patient verbalized understanding and all questions were answered. . See orders for this visit as documented in the electronic medical record. . Patient received an After Visit Summary.  CMA or LPN served as scribe during this visit. History, Physical, and Plan performed by medical provider. The above documentation has been reviewed and is accurate and complete.  Inda Coke, PA-C [place] [place] 08/25/2019  Follow-up: No follow-ups on file.

## 2019-08-25 NOTE — Telephone Encounter (Signed)
Allopurinol sent to patient's pharmacy.

## 2019-08-28 ENCOUNTER — Telehealth: Payer: Self-pay | Admitting: Interventional Cardiology

## 2019-08-28 NOTE — Telephone Encounter (Signed)
New message   Patient wants to bring his b/p machine in to compare to the one in the office. Please advise.

## 2019-08-28 NOTE — Telephone Encounter (Signed)
Spoke with pt and advised Dr Tamala Julian and Miki Kins are not in the office today but will forward message for review.  Pt verbalizes understanding and agrees with current plan.

## 2019-08-29 NOTE — Telephone Encounter (Signed)
Spoke with pt and advised ok to bring cuff to August appt.  Advised if he wanted it checked sooner, he could stop by a pharmacy or the Fire Dept and they can check it as well. Pt appreciative for assistance.

## 2019-09-10 MED ORDER — REPATHA SURECLICK 140 MG/ML ~~LOC~~ SOAJ: 140.0000 mg | SUBCUTANEOUS | 3 refills | Status: DC

## 2019-09-10 NOTE — Telephone Encounter (Signed)
Called and instructed pt to start the repatha 140mg . Pt voiced understanding.

## 2019-09-10 NOTE — Addendum Note (Signed)
Addended by: Allean Found on: 09/10/2019 05:34 PM   Modules accepted: Orders

## 2019-09-11 ENCOUNTER — Telehealth: Payer: Self-pay

## 2019-09-11 DIAGNOSIS — E785 Hyperlipidemia, unspecified: Secondary | ICD-10-CM

## 2019-09-11 NOTE — Telephone Encounter (Signed)
Called and spoke w/pt regarding the approval of the repatha, dc'd praluent, rx sent for repatha to the pharmacy of choice, pt has activated copay card and verbalized understanding, pt instructed to call back and schedule labs after their 4th injection.

## 2019-10-09 ENCOUNTER — Encounter (HOSPITAL_COMMUNITY): Payer: Self-pay

## 2019-10-09 ENCOUNTER — Other Ambulatory Visit: Payer: Self-pay

## 2019-10-09 ENCOUNTER — Ambulatory Visit (INDEPENDENT_AMBULATORY_CARE_PROVIDER_SITE_OTHER): Payer: 59

## 2019-10-09 ENCOUNTER — Ambulatory Visit (HOSPITAL_COMMUNITY)
Admission: EM | Admit: 2019-10-09 | Discharge: 2019-10-09 | Disposition: A | Discharge status: Home / Self Care | Payer: 59 | Attending: Family Medicine | Admitting: Family Medicine

## 2019-10-09 DIAGNOSIS — M545 Low back pain: Secondary | ICD-10-CM

## 2019-10-09 DIAGNOSIS — G8929 Other chronic pain: Secondary | ICD-10-CM | POA: Diagnosis not present

## 2019-10-09 NOTE — ED Triage Notes (Signed)
Pt here for lumbar xray series 2/2 lumbar pain chronic in nature. Referred by his PCP for imaging.

## 2019-10-09 NOTE — ED Provider Notes (Signed)
[facility]   [id number] 10/09/19 Arrival Time: 2094  ASSESSMENT & PLAN:  1. Chronic bilateral low back pain without sciatica     Imaging of lower back completed. He will plan f/u with PCP to discuss results.   Recommend:  Follow-up Information    Schedule an appointment as soon as possible for a visit  with Inda Coke, [place].   Specialty: Physician Assistant Why: To discuss your lower back x-ray. Contact information: [place] [address] [phone]               Reviewed expectations re: course of current medical issues. Questions answered. Outlined signs and symptoms indicating need for more acute intervention. Patient verbalized understanding. After Visit Summary given.   SUBJECTIVE: History from: patient.  Kurt Reilly is a 69 y.o. male who is sent by PCP for lower back imaging. Chronic lower back pain.   OBJECTIVE:  Vitals:   10/09/19 1501  BP: (!) 141/77  Pulse: 71  Resp: 18  Temp: 98.5 F (36.9 C)  TempSrc: Oral  SpO2: 97%    General appearance: alert; no distress Psychological: alert and cooperative; normal mood and affect   Imaging: DG Lumbar Spine Complete  Result Date: 10/09/2019 CLINICAL DATA:  Low back pain.  History of prostate carcinoma EXAM: LUMBAR SPINE - COMPLETE 4+ VIEW COMPARISON:  None. FINDINGS: Frontal, lateral, spot lumbosacral lateral, and bilateral oblique views were obtained. There are 5 non-rib-bearing lumbar type vertebral bodies. There is no fracture or spondylolisthesis. Disc spaces appear unremarkable. There is facet osteoarthritic change at L4-5 and L5-S1 bilaterally. No blastic or lytic bone lesions. There is aortic atherosclerosis. IMPRESSION: Facet osteoarthritic change at L4-5 and L5-S1 bilaterally. No appreciable disc space narrowing. No fracture or spondylolisthesis. Aortic Atherosclerosis (ICD10-I70.0). Electronically Signed   By: Lowella Grip III M.D.   On: 10/09/2019 15:12      Allergies  Allergen Reactions  . Ezetimibe Other (See Comments)  . Statins Other (See Comments)    Myalgia  . Penicillins Rash    Occurred as a child - tolerated Zosyn June 2016     Past Medical History:  Diagnosis Date  . Actinic keratosis   . Alkaline phosphatase raised   . Anemia   . Cardiac arrhythmia   . Cardiomyopathy ([place])   . CHF (congestive heart failure) ([place])   . Coronary arteriosclerosis   . ED (erectile dysfunction)   . Gout 03/09/2017  . Hemangioma 08/24/2018  . Hyperlipidemia   . Hypertension   . Hypertrophic scar   . Malignant neoplasm of prostate ([place]) 2013   Was told that less than 1% of cancer  . Melanocytic nevi of trunk   . Melanocytic nevus of skin   . Senile hyperkeratosis   . STEMI (ST elevation myocardial infarction) ([place]) 2016   Social History   Socioeconomic History  . Marital status: Married    Spouse name: Not on file  . Number of children: Not on file  . Years of education: Not on file  . Highest education level: Not on file  Occupational History  . Not on file  Tobacco Use  . Smoking status: Never Smoker  . Smokeless tobacco: Never Used  Substance and Sexual Activity  . Alcohol use: Yes    Comment: 2 glasses of wine per week  . Drug use: Not on file  . Sexual activity: Not on file  Other Topics Concern  . Not on file  Social History Narrative   Moved  to [place] in Oct 2020 to be closer to grandchildren   Retired IT sales professional, Clinical biochemist, Training and development officer   Married   3 sons   Social Determinants of Radio broadcast assistant Strain:   . Difficulty of Paying Living Expenses:   Food Insecurity:   . Worried About Charity fundraiser in the Last Year:   . Arboriculturist in the Last Year:   Transportation Needs:   . Film/video editor (Medical):   Marland Kitchen Lack of Transportation (Non-Medical):   Physical Activity:   . Days of Exercise per Week:   . Minutes of Exercise per Session:   Stress:   . Feeling of Stress  :   Social Connections:   . Frequency of Communication with Friends and Family:   . Frequency of Social Gatherings with Friends and Family:   . Attends Religious Services:   . Active Member of Clubs or Organizations:   . Attends Archivist Meetings:   Marland Kitchen Marital Status:   Intimate Partner Violence:   . Fear of Current or Ex-Partner:   . Emotionally Abused:   Marland Kitchen Physically Abused:   . Sexually Abused:    Family History  Problem Relation Age of Onset  . Cancer Father        gastric  . Hypertension Father   . High Cholesterol Father   . Yves Dill Parkinson White syndrome Son   . Migraines Son    Past Surgical History:  Procedure Laterality Date  . ICD IMPLANT    . PROSTATECTOMY  2013     Vanessa Kick, MD 10/09/19 1536

## 2019-10-12 NOTE — Progress Notes (Signed)
Cardiology Office Note:    Date:  10/13/2019   ID:  Kurt Reilly, DOB Feb 26, 1951, MRN [id number]  PCP:  Inda Coke, PA  Cardiologist:  No primary care provider on file.   Referring MD: No ref. provider found   Chief Complaint  Patient presents with  . Coronary Artery Disease  . Congestive Heart Failure    History of Present Illness:    Kurt Reilly is a 69 y.o. male with a hx of CAD with anterior MI/cardiac arrest in May 2016 with 100% LAD with PCI - had associated cardiogenic shock/failure to wean - then had staged PCI to LCX and ostial LAD in June 2016 (total of 2 stents to the LAD) [facility], chronic systolic HF, ICM, underlying ICD (subcutaneous 2017), HLD, HTN and ED. EF was less than 35% -->improved to 40 to 45% --> most recent EF 25% (07/2019)  Follow up for optimization and management of CHF related to ischemic CM.  He states that after optimizing the Entresto dose, he started feeling somewhat sluggish.  His memory was not quite as good as before.  Over the last week or so things have improved.  He denies orthopnea, PND, and syncope.   Past Medical History:  Diagnosis Date  . Actinic keratosis   . Alkaline phosphatase raised   . Anemia   . Cardiac arrhythmia   . Cardiomyopathy ([place])   . CHF (congestive heart failure) ([place])   . Coronary arteriosclerosis   . ED (erectile dysfunction)   . Gout 03/09/2017  . Hemangioma 08/24/2018  . Hyperlipidemia   . Hypertension   . Hypertrophic scar   . Malignant neoplasm of prostate ([place]) 2013   Was told that less than 1% of cancer  . Melanocytic nevi of trunk   . Melanocytic nevus of skin   . Senile hyperkeratosis   . STEMI (ST elevation myocardial infarction) ([place]) 2016    Past Surgical History:  Procedure Laterality Date  . ICD IMPLANT    . PROSTATECTOMY  2013    Current Medications: Current Meds  Medication Sig  . allopurinol (ZYLOPRIM) 300 MG tablet Take 1 tablet (300 mg total) by mouth daily.  . Aspirin  Buf,CaCarb-MgCarb-MgO, 81 MG TABS Take 81 mg by mouth daily.  . B Complex-C (B-COMPLEX WITH VITAMIN C) tablet Take 1 tablet by mouth daily.  Marland Kitchen BRILINTA 60 MG TABS tablet TAKE 1 TABLET BY MOUTH EVERY 12 HOURS  . carvedilol (COREG) 6.25 MG tablet Take 6.25 mg by mouth 2 (two) times daily with a meal.  . Cholecalciferol (VITAMIN D-3) 25 MCG (1000 UT) CAPS Take by mouth daily.   . Colchicine (MITIGARE) 0.6 MG CAPS Take 0.6 mg by mouth as needed.  . Evolocumab (REPATHA SURECLICK) 300 MG/ML SOAJ Inject 140 mg into the skin every 14 (fourteen) days.  . ferrous sulfate 325 (65 FE) MG tablet Take 325 mg by mouth every other day.   Marland Kitchen MILK THISTLE PO Take 525 mg by mouth every other day.   . nitroGLYCERIN (NITROSTAT) 0.4 MG SL tablet Place 0.4 mg under the tongue every 5 (five) minutes as needed for chest pain.  . sacubitril-valsartan (ENTRESTO) 49-51 MG Take 1 tablet by mouth 2 (two) times daily.  Marland Kitchen spironolactone (ALDACTONE) 25 MG tablet Take 25 mg by mouth daily.     Allergies:   Ezetimibe, Statins, and Penicillins   Social History   Socioeconomic History  . Marital status: Married    Spouse name: Not on file  . Number  of children: Not on file  . Years of education: Not on file  . Highest education level: Not on file  Occupational History  . Not on file  Tobacco Use  . Smoking status: Never Smoker  . Smokeless tobacco: Never Used  Substance and Sexual Activity  . Alcohol use: Yes    Comment: 2 glasses of wine per week  . Drug use: Not on file  . Sexual activity: Not on file  Other Topics Concern  . Not on file  Social History Narrative   Moved to [place] in Oct 2020 to be closer to grandchildren   Retired IT sales professional, Clinical biochemist, Training and development officer   Married   3 sons   Social Determinants of Radio broadcast assistant Strain:   . Difficulty of Paying Living Expenses:   Food Insecurity:   . Worried About Charity fundraiser in the Last Year:   . Arboriculturist in the Last  Year:   Transportation Needs:   . Film/video editor (Medical):   Marland Kitchen Lack of Transportation (Non-Medical):   Physical Activity:   . Days of Exercise per Week:   . Minutes of Exercise per Session:   Stress:   . Feeling of Stress :   Social Connections:   . Frequency of Communication with Friends and Family:   . Frequency of Social Gatherings with Friends and Family:   . Attends Religious Services:   . Active Member of Clubs or Organizations:   . Attends Archivist Meetings:   Marland Kitchen Marital Status:      Family History: The patient's family history includes Cancer in his father; High Cholesterol in his father; Hypertension in his father; Migraines in his son; Yves Dill Parkinson White syndrome in his son.  ROS:   Please see the history of present illness.    He has a subcutaneous AICD.  He has never received therapy from the device.  All other systems reviewed and are negative.  EKGs/Labs/Other Studies Reviewed:    The following studies were reviewed today:  2D Doppler echocardiogram May 2021: IMPRESSIONS    1. Left ventricular ejection fraction, by estimation, is 25 to 30%. The  left ventricle has severely decreased function. The left ventricle  demonstrates global hypokinesis. There is mild left ventricular  hypertrophy of the basal-septal segment. Left  ventricular diastolic parameters are consistent with Grade II diastolic  dysfunction (pseudonormalization). Elevated left ventricular end-diastolic  pressure. There is severe hypokinesis of the left ventricular, basal-mid  anteroseptal wall and inferoseptal  wall. There is severe hypokinesis of the left ventricular, entire  inferior wall. There is akinesis of the left ventricular, apical septal  wall, anterior wall and apical segment.  2. Right ventricular systolic function is normal. The right ventricular  size is normal. There is normal pulmonary artery systolic pressure. The  estimated right ventricular  systolic pressure is 10.6 mmHg.  3. Left atrial size was moderately dilated.  4. The mitral valve is normal in structure. Mild mitral valve  regurgitation. No evidence of mitral stenosis.  5. The aortic valve is tricuspid. Aortic valve regurgitation is not  visualized. Mild aortic valve sclerosis is present, with no evidence of  aortic valve stenosis.  6. The inferior vena cava is normal in size with greater than 50%  respiratory variability, suggesting right atrial pressure of 3 mmHg.   EKG:  EKG not repeated  Recent Labs: 07/24/2019: ALT 22 07/30/2019: Hemoglobin 13.9; Platelets 261 08/14/2019: BUN 27; Creatinine, Ser 1.16;  Potassium 4.6; Sodium 137  Recent Lipid Panel    Component Value Date/Time   CHOL 133 07/24/2019 0715   TRIG 195 (H) 07/24/2019 0715   HDL 39 (L) 07/24/2019 0715   CHOLHDL 3.4 07/24/2019 0715   LDLCALC 62 07/24/2019 0715    Physical Exam:    VS:  BP (!) 102/56   Pulse 62   Ht 5\' 6"  (1.676 m)   Wt 164 lb (74.4 kg)   SpO2 99%   BMI 26.47 kg/m     Wt Readings from Last 3 Encounters:  10/13/19 164 lb (74.4 kg)  08/25/19 159 lb 12.8 oz (72.5 kg)  07/30/19 161 lb (73 kg)     GEN: Compensated and wearing a mask. No acute distress HEENT: Normal NECK: No JVD. LYMPHATICS: No lymphadenopathy CARDIAC:  RRR without murmur, gallop, or edema. VASCULAR:  Normal Pulses. No bruits. RESPIRATORY:  Clear to auscultation without rales, wheezing or rhonchi  ABDOMEN: Soft, non-tender, non-distended, No pulsatile mass, MUSCULOSKELETAL: No deformity  SKIN: Warm and dry NEUROLOGIC:  Alert and oriented x 3 PSYCHIATRIC:  Normal affect   ASSESSMENT:    1. Coronary artery disease involving native coronary artery of native heart without angina pectoris   2. Chronic systolic heart failure ([place])   3. Hyperlipidemia LDL goal <70   4. ICD (implantable cardioverter-defibrillator) in place   5. Cardiac arrest with ventricular fibrillation ([place])   6. Essential  hypertension   7. Educated about COVID-19 virus infection    PLAN:    In order of problems listed above:  1. No angina.  Secondary prevention reviewed. 2. Currently on carvedilol, Aldactone, and Entresto.  Blood pressures are relatively low but he seems to be tolerating therapy well.  Last potassium was 4.8 in June.  Next potassium will be done in September.  He is just now starting to feel like himself after the addition of Entresto and upward titration.  Would not add an SGLT2 at this time but I did warn him Farxiga 5 mg or 10 mg/day would likely be started at the next office visit. 3. Repatha should be continued with target LDL less than 70. 4. Left axillary and lateral chest subcutaneous defibrillator. 5. No issues with recurrent VF 6. Blood pressures well controlled on the medications listed above. 7. He has been vaccinated.   Guideline directed therapy for left ventricular systolic dysfunction: Angiotensin receptor-neprilysin inhibitor (ARNI)-Entresto; beta-blocker therapy - carvedilol, metoprolol succinate, or bisoprolol; mineralocorticoid receptor antagonist (MRA) therapy -spironolactone or eplerenone.  SGLT-2 agents -  Dapagliflozin [place]) or Empagliflozin ([place]).These therapies have been shown to improve clinical outcomes including reduction of rehospitalization, survival, and acute heart failure.   Medication Adjustments/Labs and Tests Ordered: Current medicines are reviewed at length with the patient today.  Concerns regarding medicines are outlined above.  No orders of the defined types were placed in this encounter.  No orders of the defined types were placed in this encounter.   There are no Patient Instructions on file for this visit.   Signed, Sinclair Grooms, MD  10/13/2019 1:41 PM    [place]

## 2019-10-13 ENCOUNTER — Other Ambulatory Visit: Payer: Self-pay

## 2019-10-13 ENCOUNTER — Encounter: Payer: Self-pay | Admitting: Interventional Cardiology

## 2019-10-13 ENCOUNTER — Ambulatory Visit: Payer: 59 | Admitting: Interventional Cardiology

## 2019-10-13 VITALS — BP 102/56 | HR 62 | Ht 66.0 in | Wt 164.0 lb

## 2019-10-13 DIAGNOSIS — I5022 Chronic systolic (congestive) heart failure: Secondary | ICD-10-CM

## 2019-10-13 DIAGNOSIS — I2510 Atherosclerotic heart disease of native coronary artery without angina pectoris: Secondary | ICD-10-CM

## 2019-10-13 DIAGNOSIS — Z95810 Presence of automatic (implantable) cardiac defibrillator: Secondary | ICD-10-CM | POA: Diagnosis not present

## 2019-10-13 DIAGNOSIS — E785 Hyperlipidemia, unspecified: Secondary | ICD-10-CM

## 2019-10-13 DIAGNOSIS — I469 Cardiac arrest, cause unspecified: Secondary | ICD-10-CM

## 2019-10-13 DIAGNOSIS — I4901 Ventricular fibrillation: Secondary | ICD-10-CM

## 2019-10-13 DIAGNOSIS — I10 Essential (primary) hypertension: Secondary | ICD-10-CM

## 2019-10-13 DIAGNOSIS — Z7189 Other specified counseling: Secondary | ICD-10-CM

## 2019-10-13 NOTE — Patient Instructions (Signed)
Medication Instructions:  Your physician recommends that you continue on your current medications as directed. Please refer to the Current Medication list given to you today.  *If you need a refill on your cardiac medications before your next appointment, please call your pharmacy*   Lab Work: BMET in mid-late September  If you have labs (blood work) drawn today and your tests are completely normal, you will receive your results only by: Marland Kitchen MyChart Message (if you have MyChart) OR . A paper copy in the mail If you have any lab test that is abnormal or we need to change your treatment, we will call you to review the results.   Testing/Procedures: None   Follow-Up: At [facility], you and your health needs are our priority.  As part of our continuing mission to provide you with exceptional heart care, we have created designated Provider Care Teams.  These Care Teams include your primary Cardiologist (physician) and Advanced Practice Providers (APPs -  Physician Assistants and Nurse Practitioners) who all work together to provide you with the care you need, when you need it.  We recommend signing up for the patient portal called "MyChart".  Sign up information is provided on this After Visit Summary.  MyChart is used to connect with patients for Virtual Visits (Telemedicine).  Patients are able to view lab/test results, encounter notes, upcoming appointments, etc.  Non-urgent messages can be sent to your provider as well.   To learn more about what you can do with MyChart, go to [email or web address].    Your next appointment:   3 month(s)  The format for your next appointment:   In Person  Provider:   You may see Dr. Daneen Schick or one of the following Advanced Practice Providers on your designated Care Team:    Truitt Merle, NP  Cecilie Kicks, NP  Kathyrn Drown, NP    Other Instructions

## 2019-10-16 ENCOUNTER — Telehealth: Payer: Self-pay

## 2019-10-16 ENCOUNTER — Other Ambulatory Visit: Payer: Self-pay | Admitting: Physician Assistant

## 2019-10-16 DIAGNOSIS — G8929 Other chronic pain: Secondary | ICD-10-CM

## 2019-10-16 NOTE — Telephone Encounter (Signed)
Pt would like to also get a referral for physical therapy.

## 2019-10-16 NOTE — Telephone Encounter (Signed)
Back xray shows arthritis.  I have put in a referral for PT.

## 2019-10-16 NOTE — Telephone Encounter (Signed)
Pt is wanting to know his xray results on his back.

## 2019-10-17 ENCOUNTER — Telehealth: Payer: Self-pay

## 2019-10-17 ENCOUNTER — Ambulatory Visit: Payer: 59

## 2019-10-17 NOTE — Telephone Encounter (Signed)
Please call and schedule appointment for patient to discuss his concerns.

## 2019-10-17 NOTE — Telephone Encounter (Signed)
Scheduled pt with Kurt Reilly at 2 on 8/18

## 2019-10-22 ENCOUNTER — Ambulatory Visit: Payer: 59 | Admitting: Physical Therapy

## 2019-10-22 ENCOUNTER — Ambulatory Visit: Payer: 59 | Admitting: Physician Assistant

## 2019-10-22 DIAGNOSIS — Z0289 Encounter for other administrative examinations: Secondary | ICD-10-CM

## 2019-10-22 NOTE — Progress Notes (Deleted)
Kurt Reilly is a 69 y.o. male here for a new problem.  I acted as a Education administrator for [employer], PA-C [employer], LPN   History of Present Illness:   No chief complaint on file.   HPI  Past Medical History:  Diagnosis Date  . Actinic keratosis   . Alkaline phosphatase raised   . Anemia   . Cardiac arrhythmia   . Cardiomyopathy ([place])   . CHF (congestive heart failure) ([place])   . Coronary arteriosclerosis   . ED (erectile dysfunction)   . Gout 03/09/2017  . Hemangioma 08/24/2018  . Hyperlipidemia   . Hypertension   . Hypertrophic scar   . Malignant neoplasm of prostate ([place]) 2013   Was told that less than 1% of cancer  . Melanocytic nevi of trunk   . Melanocytic nevus of skin   . Senile hyperkeratosis   . STEMI (ST elevation myocardial infarction) ([place]) 2016     Social History   Tobacco Use  . Smoking status: Never Smoker  . Smokeless tobacco: Never Used  Substance Use Topics  . Alcohol use: Yes    Comment: 2 glasses of wine per week  . Drug use: Not on file    Past Surgical History:  Procedure Laterality Date  . ICD IMPLANT    . PROSTATECTOMY  2013    Family History  Problem Relation Age of Onset  . Cancer Father        gastric  . Hypertension Father   . High Cholesterol Father   . Yves Dill Parkinson White syndrome Son   . Migraines Son     Allergies  Allergen Reactions  . Ezetimibe Other (See Comments)  . Statins Other (See Comments)    Myalgia  . Penicillins Rash    Occurred as a child - tolerated Zosyn June 2016     Current Medications:   Current Outpatient Medications:  .  allopurinol (ZYLOPRIM) 300 MG tablet, Take 1 tablet (300 mg total) by mouth daily., Disp: 90 tablet, Rfl: 0 .  Aspirin Buf,CaCarb-MgCarb-MgO, 81 MG TABS, Take 81 mg by mouth daily., Disp: , Rfl:  .  B Complex-C (B-COMPLEX WITH VITAMIN C) tablet, Take 1 tablet by mouth daily., Disp: , Rfl:  .  BRILINTA 60 MG TABS tablet, TAKE 1 TABLET BY MOUTH EVERY 12 HOURS, Disp: 180  tablet, Rfl: 3 .  carvedilol (COREG) 6.25 MG tablet, Take 6.25 mg by mouth 2 (two) times daily with a meal., Disp: , Rfl:  .  Cholecalciferol (VITAMIN D-3) 25 MCG (1000 UT) CAPS, Take by mouth daily. , Disp: , Rfl:  .  Colchicine (MITIGARE) 0.6 MG CAPS, Take 0.6 mg by mouth as needed., Disp: , Rfl:  .  Evolocumab (REPATHA SURECLICK) 284 MG/ML SOAJ, Inject 140 mg into the skin every 14 (fourteen) days., Disp: 6 pen, Rfl: 3 .  ferrous sulfate 325 (65 FE) MG tablet, Take 325 mg by mouth every other day. , Disp: , Rfl:  .  MILK THISTLE PO, Take 525 mg by mouth every other day. , Disp: , Rfl:  .  nitroGLYCERIN (NITROSTAT) 0.4 MG SL tablet, Place 0.4 mg under the tongue every 5 (five) minutes as needed for chest pain., Disp: , Rfl:  .  sacubitril-valsartan (ENTRESTO) 49-51 MG, Take 1 tablet by mouth 2 (two) times daily., Disp: 180 tablet, Rfl: 3 .  spironolactone (ALDACTONE) 25 MG tablet, Take 25 mg by mouth daily., Disp: , Rfl:    Review of Systems:   ROS  Vitals:   There were no vitals filed for this visit.   There is no height or weight on file to calculate BMI.  Physical Exam:   Physical Exam  Results for orders placed or performed in visit on 75/17/00  Basic metabolic panel  Result Value Ref Range   Glucose 108 (H) 65 - 99 mg/dL   BUN 27 8 - 27 mg/dL   Creatinine, Ser 1.16 0.76 - 1.27 mg/dL   GFR calc non Af Amer 65 >59 mL/min/1.73   GFR calc Af Amer 75 >59 mL/min/1.73   BUN/Creatinine Ratio 23 10 - 24   Sodium 137 134 - 144 mmol/L   Potassium 4.6 3.5 - 5.2 mmol/L   Chloride 102 96 - 106 mmol/L   CO2 22 20 - 29 mmol/L   Calcium 9.6 8.6 - 10.2 mg/dL    Assessment and Plan:   There are no diagnoses linked to this encounter.  . Reviewed expectations re: course of current medical issues. . Discussed self-management of symptoms. . Outlined signs and symptoms indicating need for more acute intervention. . Patient verbalized understanding and all questions were answered. . See  orders for this visit as documented in the electronic medical record. . Patient received an After-Visit Summary.  ***  Inda Coke, PA-C

## 2019-10-23 ENCOUNTER — Encounter: Payer: Self-pay | Admitting: Physician Assistant

## 2019-10-29 ENCOUNTER — Ambulatory Visit (INDEPENDENT_AMBULATORY_CARE_PROVIDER_SITE_OTHER): Payer: 59 | Admitting: Physical Therapy

## 2019-10-29 ENCOUNTER — Encounter: Payer: Self-pay | Admitting: Physical Therapy

## 2019-10-29 ENCOUNTER — Other Ambulatory Visit: Payer: Self-pay

## 2019-10-29 DIAGNOSIS — M545 Low back pain, unspecified: Secondary | ICD-10-CM

## 2019-10-29 DIAGNOSIS — G8929 Other chronic pain: Secondary | ICD-10-CM

## 2019-10-29 NOTE — Patient Instructions (Signed)
Access Code: [email or web address] URL: [email or web address] Date: 10/29/2019 Prepared by: Lyndee Hensen  Exercises Supine Posterior Pelvic Tilt - 2 x daily - 2 sets - 10 reps Supine Single Knee to Chest Stretch - 2 x daily - 3 reps - 30 hold Supine Figure 4 Piriformis Stretch - 2 x daily - 3 reps - 30 hold Supine March with Posterior Pelvic Tilt - 2 x daily - 2 sets - 10 reps

## 2019-10-29 NOTE — Therapy (Signed)
[facility] [address] [place], [place], [address] Phone: [phone]   Fax:  [phone]  Physical Therapy Evaluation  Patient Details  Name: Kurt Reilly MRN: [id number] Date of Birth: Oct 22, 1950 Referring Provider (PT): Inda Coke   Encounter Date: 10/29/2019   PT End of Session - 10/29/19 1332    Visit Number 1    Number of Visits 12    Date for PT Re-Evaluation 12/10/19    Authorization Type UHC    PT Start Time 1300    PT Stop Time 1340    PT Time Calculation (min) 40 min    Activity Tolerance Patient tolerated treatment well    Behavior During Therapy [facility] for tasks assessed/performed           Past Medical History:  Diagnosis Date  . Actinic keratosis   . Alkaline phosphatase raised   . Anemia   . Cardiac arrhythmia   . Cardiomyopathy ([place])   . CHF (congestive heart failure) ([place])   . Coronary arteriosclerosis   . ED (erectile dysfunction)   . Gout 03/09/2017  . Hemangioma 08/24/2018  . Hyperlipidemia   . Hypertension   . Hypertrophic scar   . Malignant neoplasm of prostate ([place]) 2013   Was told that less than 1% of cancer  . Melanocytic nevi of trunk   . Melanocytic nevus of skin   . Senile hyperkeratosis   . STEMI (ST elevation myocardial infarction) ([place]) 2016    Past Surgical History:  Procedure Laterality Date  . ICD IMPLANT    . PROSTATECTOMY  2013    There were no vitals filed for this visit.    Subjective Assessment - 10/29/19 1303    Subjective Pt states increased pain about 8 mo ago, has not been able to find much relief. Likes to be active, likes walking and woodworking.    Pertinent History MI,  Difibulator pace maker    Patient Stated Goals decreased pain    Currently in Pain? Yes    Pain Score 4     Pain Location Back    Pain Orientation Right;Left    Pain Descriptors / Indicators Aching    Pain Type Acute pain    Pain Onset More than a month ago    Pain Frequency Intermittent     Aggravating Factors  sitting, first thing in AM, later at night.    Pain Relieving Factors standing, walking, movement              [facility] PT Assessment - 10/29/19 0001      Assessment   Medical Diagnosis Low back pain    Referring Provider (PT) Inda Coke    Prior Therapy no      Balance Screen   Has the patient fallen in the past 6 months No      Prior Function   Level of Independence Independent      Cognition   Overall Cognitive Status Within Functional Limits for tasks assessed      ROM / Strength   AROM / PROM / Strength AROM;Strength      AROM   AROM Assessment Site Lumbar    Lumbar Flexion wfl    Lumbar Extension mod limitation/pain    Lumbar - Right Side Bend mild limitation/pain    Lumbar - Left Side Bend wfl      Strength   Overall Strength Comments core: 4-/5, Hips: 4/5,       Palpation   Palpation comment  Pain and tenderness in central lumbar region with PAs, minimal soreness in surrounding musculture or glute. No pain to palpate R hip/gr troch today       Special Tests   Other special tests Neg SLR,                       Objective measurements completed on examination: See above findings.       [facility] Adult PT Treatment/Exercise - 10/29/19 0001      Exercises   Exercises Lumbar      Lumbar Exercises: Stretches   Single Knee to Chest Stretch 2 reps;30 seconds    Pelvic Tilt 20 reps    Piriformis Stretch 3 reps;30 seconds    Piriformis Stretch Limitations supine fig 4       Lumbar Exercises: Supine   Ab Set 10 reps    Bent Knee Raise 20 reps    Bent Knee Raise Limitations with TA                  PT Education - 10/29/19 1541    Education Details PT POC, exam findings, HEP    Person(s) Educated Patient    Methods Explanation;Demonstration;Tactile cues;Verbal cues    Comprehension Verbalized understanding;Returned demonstration;Verbal cues required;Tactile cues required;Need further instruction             PT Short Term Goals - 10/29/19 1542      PT SHORT TERM GOAL #1   Title Pt to be independent with initial HEP             PT Long Term Goals - 10/29/19 1544      PT LONG TERM GOAL #1   Title Pt to be independent with final HEP    Time 6    Period Weeks    Status New    Target Date 12/10/19      PT LONG TERM GOAL #2   Title Pt to report decreased pain in lumbar region to 0-2/10    Time 6    Period Weeks    Status New    Target Date 12/10/19      PT LONG TERM GOAL #3   Title Pt to report ability for sitting at least 30 min without pain.    Time 6    Period Weeks    Status New    Target Date 12/10/19      PT LONG TERM GOAL #4   Title Pt to demo proper lift/squat technique to improve ability and safety with IADLs.    Time 6    Period Weeks    Status New    Target Date 12/10/19                  Plan - 10/29/19 1547    Clinical Impression Statement Pt presents with primary complaint of increased pain in low back, for about 8 months. He has pain and limited motion for extension. Pt with much pain and difficulty with sitting. He has tenderness in central lumbar region, and weakness in hips, core, and lack of effective HEP for dx. Pt with decreased ability for full functional activities and will benefit from skilled PT to improve.    Personal Factors and Comorbidities Time since onset of injury/illness/exacerbation    Examination-Activity Limitations Locomotion Level;Sit;Carry;Sleep;Lift;Stand;Bend    Examination-Participation Restrictions Meal Prep;Cleaning;Community Activity;Driving;Yard Work    Stability/Clinical Decision Making Stable/Uncomplicated    Designer, jewellery Low    Rehab Potential  Good    PT Frequency 2x / week    PT Duration 6 weeks    PT Treatment/Interventions ADLs/Self Care Home Management;Cryotherapy;Moist Heat;Traction;Gait training;Neuromuscular re-education;Balance training;Therapeutic exercise;Therapeutic activities;Functional  mobility training;Stair training;Patient/family education;Orthotic Fit/Training;Manual techniques;Taping;Dry needling;Passive range of motion;Spinal Manipulations;Joint Manipulations    Consulted and Agree with Plan of Care Patient           Patient will benefit from skilled therapeutic intervention in order to improve the following deficits and impairments:  Decreased range of motion, Increased muscle spasms, Decreased activity tolerance, Pain, Impaired flexibility, Improper body mechanics, Decreased mobility, Decreased strength  Visit Diagnosis: Chronic bilateral low back pain without sciatica     Problem List Patient Active Problem List   Diagnosis Date Noted  . Hyperlipidemia LDL goal <70 06/18/2019  . Nonischemic cardiomyopathy ([place]) 05/13/2019  . Cardiac arrest with ventricular fibrillation ([place]) 05/13/2019  . 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 Hypertrophic scar     Lyndee Hensen, PT, DPT 4:01 PM  10/29/19    [facility] [place], [place], [address] Phone: [phone]   Fax:  [phone]  Name: Kurt Reilly MRN: [id number] Date of Birth: 1950-09-26

## 2019-11-05 ENCOUNTER — Encounter: Payer: Self-pay | Admitting: Physical Therapy

## 2019-11-05 ENCOUNTER — Other Ambulatory Visit: Payer: Self-pay

## 2019-11-05 ENCOUNTER — Ambulatory Visit (INDEPENDENT_AMBULATORY_CARE_PROVIDER_SITE_OTHER): Payer: 59 | Admitting: Physical Therapy

## 2019-11-05 DIAGNOSIS — G8929 Other chronic pain: Secondary | ICD-10-CM | POA: Diagnosis not present

## 2019-11-05 DIAGNOSIS — M545 Low back pain: Secondary | ICD-10-CM

## 2019-11-05 NOTE — Therapy (Signed)
[facility] [address] [place], [place], [address] Phone: [phone]   Fax:  [phone]  Physical Therapy Treatment  Patient Details  Name: Kurt Reilly MRN: [id number] Date of Birth: 08-03-1950 Referring Provider (PT): Inda Coke   Encounter Date: 11/05/2019   PT End of Session - 11/05/19 1356    Visit Number 2    Number of Visits 12    Date for PT Re-Evaluation 12/10/19    Authorization Type UHC    PT Start Time 8938    PT Stop Time 1430    PT Time Calculation (min) 42 min    Activity Tolerance Patient tolerated treatment well    Behavior During Therapy [facility] for tasks assessed/performed           Past Medical History:  Diagnosis Date  . Actinic keratosis   . Alkaline phosphatase raised   . Anemia   . Cardiac arrhythmia   . Cardiomyopathy ([place])   . CHF (congestive heart failure) ([place])   . Coronary arteriosclerosis   . ED (erectile dysfunction)   . Gout 03/09/2017  . Hemangioma 08/24/2018  . Hyperlipidemia   . Hypertension   . Hypertrophic scar   . Malignant neoplasm of prostate ([place]) 2013   Was told that less than 1% of cancer  . Melanocytic nevi of trunk   . Melanocytic nevus of skin   . Senile hyperkeratosis   . STEMI (ST elevation myocardial infarction) ([place]) 2016    Past Surgical History:  Procedure Laterality Date  . ICD IMPLANT    . PROSTATECTOMY  2013    There were no vitals filed for this visit.   Subjective Assessment - 11/05/19 1355    Subjective Pt states continued pain. Most pain in evening and night, and in am, up to 9/10    Patient Stated Goals decreased pain    Currently in Pain? Yes    Pain Score 4     Pain Location Back    Pain Orientation Right;Left    Pain Descriptors / Indicators Aching    Pain Onset More than a month ago    Pain Frequency Intermittent                             [facility] Adult PT Treatment/Exercise - 11/05/19 1357      Exercises   Exercises  Lumbar      Lumbar Exercises: Stretches   Active Hamstring Stretch 2 reps;30 seconds    Active Hamstring Stretch Limitations seated    Single Knee to Chest Stretch 2 reps;30 seconds    Pelvic Tilt 20 reps    Piriformis Stretch 3 reps;30 seconds    Piriformis Stretch Limitations seated    Other Lumbar Stretch Exercise Childs pose 30 sec x 3 l, r, center       Lumbar Exercises: Aerobic   Recumbent Bike L2 x 6 min;       Lumbar Exercises: Standing   Other Standing Lumbar Exercises March x 20, hip abd x 20 bil;       Lumbar Exercises: Supine   Ab Set --    Pelvic Tilt 20 reps    Bent Knee Raise 20 reps    Bent Knee Raise Limitations with TA    Straight Leg Raise 10 reps    Straight Leg Raises Limitations bil with TA      Lumbar Exercises: Quadruped   [place] 20 reps  Manual Therapy   Manual Therapy Joint mobilization;Soft tissue mobilization;Manual Traction    Soft tissue mobilization DTM and IASTM  to R lumbar parspinals and glute                     PT Short Term Goals - 10/29/19 1542      PT SHORT TERM GOAL #1   Title Pt to be independent with initial HEP             PT Long Term Goals - 10/29/19 1544      PT LONG TERM GOAL #1   Title Pt to be independent with final HEP    Time 6    Period Weeks    Status New    Target Date 12/10/19      PT LONG TERM GOAL #2   Title Pt to report decreased pain in lumbar region to 0-2/10    Time 6    Period Weeks    Status New    Target Date 12/10/19      PT LONG TERM GOAL #3   Title Pt to report ability for sitting at least 30 min without pain.    Time 6    Period Weeks    Status New    Target Date 12/10/19      PT LONG TERM GOAL #4   Title Pt to demo proper lift/squat technique to improve ability and safety with IADLs.    Time 6    Period Weeks    Status New    Target Date 12/10/19                 Plan - 11/05/19 1531    Clinical Impression Statement Pt continues to have quite  a bit of pain. Focus on flexion bias stretches today, ther ex progressed and HEP updated. Plan to progress as tolerated.    Personal Factors and Comorbidities Time since onset of injury/illness/exacerbation    Examination-Activity Limitations Locomotion Level;Sit;Carry;Sleep;Lift;Stand;Bend    Examination-Participation Restrictions Meal Prep;Cleaning;Community Activity;Driving;Yard Work    Stability/Clinical Decision Making Stable/Uncomplicated    Rehab Potential Good    PT Frequency 2x / week    PT Duration 6 weeks    PT Treatment/Interventions ADLs/Self Care Home Management;Cryotherapy;Moist Heat;Traction;Gait training;Neuromuscular re-education;Balance training;Therapeutic exercise;Therapeutic activities;Functional mobility training;Stair training;Patient/family education;Orthotic Fit/Training;Manual techniques;Taping;Dry needling;Passive range of motion;Spinal Manipulations;Joint Manipulations    Consulted and Agree with Plan of Care Patient           Patient will benefit from skilled therapeutic intervention in order to improve the following deficits and impairments:  Decreased range of motion, Increased muscle spasms, Decreased activity tolerance, Pain, Impaired flexibility, Improper body mechanics, Decreased mobility, Decreased strength  Visit Diagnosis: Chronic bilateral low back pain without sciatica     Problem List Patient Active Problem List   Diagnosis Date Noted  . Hyperlipidemia LDL goal <70 06/18/2019  . Nonischemic cardiomyopathy ([place]) 05/13/2019  . Cardiac arrest with ventricular fibrillation ([place]) 05/13/2019  . 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 Hypertrophic scar     Lyndee Hensen, PT, DPT 3:42 PM  11/05/19    [facility] [place], [place], [address] Phone: [phone]   Fax:  [phone]  Name: Eura Radabaugh MRN: [id number] Date of Birth: June 12, 1950

## 2019-11-06 ENCOUNTER — Encounter: Payer: Self-pay | Admitting: Physician Assistant

## 2019-11-07 ENCOUNTER — Ambulatory Visit (INDEPENDENT_AMBULATORY_CARE_PROVIDER_SITE_OTHER): Payer: 59 | Admitting: *Deleted

## 2019-11-07 DIAGNOSIS — I428 Other cardiomyopathies: Secondary | ICD-10-CM

## 2019-11-09 LAB — CUP PACEART REMOTE DEVICE CHECK
Battery Remaining Percentage: 53 %
Date Time Interrogation Session: 20210903204300
Implantable Lead Implant Date: 20170727
Implantable Lead Location: 753862
Implantable Lead Model: 3401
Implantable Pulse Generator Implant Date: 20170727
Pulse Gen Serial Number: 203970

## 2019-11-12 ENCOUNTER — Encounter: Payer: Self-pay | Admitting: Physical Therapy

## 2019-11-12 ENCOUNTER — Ambulatory Visit (INDEPENDENT_AMBULATORY_CARE_PROVIDER_SITE_OTHER): Payer: 59 | Admitting: Physical Therapy

## 2019-11-12 ENCOUNTER — Other Ambulatory Visit: Payer: Self-pay

## 2019-11-12 DIAGNOSIS — M545 Low back pain: Secondary | ICD-10-CM

## 2019-11-12 DIAGNOSIS — G8929 Other chronic pain: Secondary | ICD-10-CM | POA: Diagnosis not present

## 2019-11-12 NOTE — Therapy (Signed)
[facility] [address] [place], [place], [address] Phone: [phone]   Fax:  [phone]  Physical Therapy Treatment  Patient Details  Name: Estaban Mainville MRN: [id number] Date of Birth: 06-07-1950 Referring Provider (PT): Inda Coke   Encounter Date: 11/12/2019   PT End of Session - 11/12/19 1116    Visit Number 3    Number of Visits 12    Date for PT Re-Evaluation 12/10/19    Authorization Type UHC    PT Start Time 1105    PT Stop Time 1146    PT Time Calculation (min) 41 min    Activity Tolerance Patient tolerated treatment well    Behavior During Therapy [facility] for tasks assessed/performed           Past Medical History:  Diagnosis Date  . Actinic keratosis   . Alkaline phosphatase raised   . Anemia   . Cardiac arrhythmia   . Cardiomyopathy ([place])   . CHF (congestive heart failure) ([place])   . Coronary arteriosclerosis   . ED (erectile dysfunction)   . Gout 03/09/2017  . Hemangioma 08/24/2018  . Hyperlipidemia   . Hypertension   . Hypertrophic scar   . Malignant neoplasm of prostate ([place]) 2013   Was told that less than 1% of cancer  . Melanocytic nevi of trunk   . Melanocytic nevus of skin   . Senile hyperkeratosis   . STEMI (ST elevation myocardial infarction) ([place]) 2016    Past Surgical History:  Procedure Laterality Date  . ICD IMPLANT    . PROSTATECTOMY  2013    There were no vitals filed for this visit.   Subjective Assessment - 11/12/19 1115    Subjective Pt continues to have quite a bit of pain. Did feel better after last session. Likes flexion stretches, relieves pain,  but reports cant sit on couch or in chair, due to severe pain.    Currently in Pain? Yes    Pain Score 8     Pain Location Back    Pain Orientation Right;Left    Pain Descriptors / Indicators Aching    Pain Type Acute pain    Pain Onset More than a month ago    Pain Frequency Intermittent                              [facility] Adult PT Treatment/Exercise - 11/12/19 1118      Exercises   Exercises Lumbar      Lumbar Exercises: Stretches   Active Hamstring Stretch --    Active Hamstring Stretch Limitations --    Single Knee to Chest Stretch 2 reps;30 seconds    Lower Trunk Rotation 10 seconds;5 reps    Pelvic Tilt 20 reps    Piriformis Stretch 3 reps;30 seconds    Piriformis Stretch Limitations seated    Other Lumbar Stretch Exercise Childs pose 30 sec x 3 l, r, center       Lumbar Exercises: Aerobic   Recumbent Bike --      Lumbar Exercises: Standing   Other Standing Lumbar Exercises L side glides x 20       Lumbar Exercises: Supine   Pelvic Tilt 20 reps    Clam 20 reps    Clam Limitations RTB    Bent Knee Raise 20 reps    Bent Knee Raise Limitations with TA    Straight Leg Raise --    Straight  Leg Raises Limitations --      Lumbar Exercises: Quadruped   Madcat/Old Horse 20 reps      Manual Therapy   Manual Therapy Joint mobilization;Soft tissue mobilization;Manual Traction;Passive ROM    Soft tissue mobilization DTM and IASTM  to R lumbar parspinals and glute     Passive ROM Manual shift correction     Manual Traction Long leg distraction on R for lumbar pump x 2 min                    PT Short Term Goals - 10/29/19 1542      PT SHORT TERM GOAL #1   Title Pt to be independent with initial HEP             PT Long Term Goals - 10/29/19 1544      PT LONG TERM GOAL #1   Title Pt to be independent with final HEP    Time 6    Period Weeks    Status New    Target Date 12/10/19      PT LONG TERM GOAL #2   Title Pt to report decreased pain in lumbar region to 0-2/10    Time 6    Period Weeks    Status New    Target Date 12/10/19      PT LONG TERM GOAL #3   Title Pt to report ability for sitting at least 30 min without pain.    Time 6    Period Weeks    Status New    Target Date 12/10/19      PT LONG TERM GOAL #4    Title Pt to demo proper lift/squat technique to improve ability and safety with IADLs.    Time 6    Period Weeks    Status New    Target Date 12/10/19                 Plan - 11/12/19 1159    Clinical Impression Statement Pt with slower pain improvements than expected. Discussed referral to sports med, pt will call today. Pt responding well to flexion bias, and manual, for decreased pain, but continues to report much difficulty sitting, and high pain levels. Neg nerve tension/SLR test. Plan to progress as tolerated.    Personal Factors and Comorbidities Time since onset of injury/illness/exacerbation    Examination-Activity Limitations Locomotion Level;Sit;Carry;Sleep;Lift;Stand;Bend    Examination-Participation Restrictions Meal Prep;Cleaning;Community Activity;Driving;Yard Work    Stability/Clinical Decision Making Stable/Uncomplicated    Rehab Potential Good    PT Frequency 2x / week    PT Duration 6 weeks    PT Treatment/Interventions ADLs/Self Care Home Management;Cryotherapy;Moist Heat;Traction;Gait training;Neuromuscular re-education;Balance training;Therapeutic exercise;Therapeutic activities;Functional mobility training;Stair training;Patient/family education;Orthotic Fit/Training;Manual techniques;Taping;Dry needling;Passive range of motion;Spinal Manipulations;Joint Manipulations    Consulted and Agree with Plan of Care Patient           Patient will benefit from skilled therapeutic intervention in order to improve the following deficits and impairments:  Decreased range of motion, Increased muscle spasms, Decreased activity tolerance, Pain, Impaired flexibility, Improper body mechanics, Decreased mobility, Decreased strength  Visit Diagnosis: Chronic bilateral low back pain without sciatica     Problem List Patient Active Problem List   Diagnosis Date Noted  . Hyperlipidemia LDL goal <70 06/18/2019  . 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 Cardiac arrest  with ventricular fibrillation ([place]) 05/13/2019  . 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 Hypertrophic scar     Dolly Harbach  Kayleen Memos, PT, DPT 12:01 PM  11/12/19    [facility] [place], [place], [address] Phone: [phone]   Fax:  [phone]  Name: Savoy [place] MRN: [id number] Date of Birth: February 27, 1951

## 2019-11-12 NOTE — Progress Notes (Signed)
Remote ICD transmission.   

## 2019-11-17 ENCOUNTER — Other Ambulatory Visit: Payer: Self-pay

## 2019-11-17 MED ORDER — NITROGLYCERIN 0.4 MG SL SUBL: 0.4000 mg | SUBLINGUAL_TABLET | SUBLINGUAL | 6 refills | Status: DC | PRN

## 2019-11-18 ENCOUNTER — Encounter: Payer: Self-pay | Admitting: Physical Therapy

## 2019-11-18 ENCOUNTER — Other Ambulatory Visit: Payer: Self-pay

## 2019-11-18 ENCOUNTER — Ambulatory Visit (INDEPENDENT_AMBULATORY_CARE_PROVIDER_SITE_OTHER): Payer: 59 | Admitting: Physical Therapy

## 2019-11-18 DIAGNOSIS — G8929 Other chronic pain: Secondary | ICD-10-CM

## 2019-11-18 DIAGNOSIS — M545 Low back pain: Secondary | ICD-10-CM

## 2019-11-18 NOTE — Therapy (Addendum)
[facility] [address] [place], [place], [address] Phone: [phone]   Fax:  [phone]  Physical Therapy Treatment  Patient Details  Name: Kurt Reilly MRN: [id number] Date of Birth: Jul 31, 1950 Referring Provider (PT): Inda Coke   Encounter Date: 11/18/2019   PT End of Session - 11/18/19 1222    Visit Number 4    Number of Visits 12    Date for PT Re-Evaluation 12/10/19    Authorization Type UHC    PT Start Time 1216    PT Stop Time 1255    PT Time Calculation (min) 39 min    Activity Tolerance Patient tolerated treatment well    Behavior During Therapy [facility] for tasks assessed/performed           Past Medical History:  Diagnosis Date  . Actinic keratosis   . Alkaline phosphatase raised   . Anemia   . Cardiac arrhythmia   . Cardiomyopathy ([place])   . CHF (congestive heart failure) ([place])   . Coronary arteriosclerosis   . ED (erectile dysfunction)   . Gout 03/09/2017  . Hemangioma 08/24/2018  . Hyperlipidemia   . Hypertension   . Hypertrophic scar   . Malignant neoplasm of prostate ([place]) 2013   Was told that less than 1% of cancer  . Melanocytic nevi of trunk   . Melanocytic nevus of skin   . Senile hyperkeratosis   . STEMI (ST elevation myocardial infarction) ([place]) 2016    Past Surgical History:  Procedure Laterality Date  . ICD IMPLANT    . PROSTATECTOMY  2013    There were no vitals filed for this visit.   Subjective Assessment - 11/18/19 1221    Subjective Pt states slight improvments in pain after last session.    Currently in Pain? Yes    Pain Score 3     Pain Location Back    Pain Orientation Right;Left    Pain Descriptors / Indicators Aching    Pain Type Acute pain    Pain Onset More than a month ago    Pain Frequency Intermittent    Aggravating Factors  up to 8/10 in evening and in AM.                             [facility] Adult PT Treatment/Exercise - 11/18/19 0001       Lumbar Exercises: Stretches   Active Hamstring Stretch 2 reps;30 seconds    Active Hamstring Stretch Limitations seated    Single Knee to Chest Stretch 2 reps;30 seconds    Piriformis Stretch 3 reps;30 seconds    Piriformis Stretch Limitations supine      Lumbar Exercises: Standing   Row 20 reps    Theraband Level (Row) Level 4 (Blue)    Other Standing Lumbar Exercises L side glides x 20     Other Standing Lumbar Exercises March, hip abd, ext x 20 ea;       Lumbar Exercises: Supine   Clam 20 reps    Clam Limitations GTB    Bent Knee Raise 20 reps    Bent Knee Raise Limitations GTB     Bridge 20 reps    Other Supine Lumbar Exercises self MET to inc R ant rotation      Lumbar Exercises: Quadruped   Madcat/Old Horse 20 reps    Opposite Arm/Leg Raise 20 reps    Opposite Arm/Leg Raise Limitations LEs only  Manual Therapy   Manual Therapy Joint mobilization    Joint Mobilization R ant hip mobs to inc extension     Passive ROM MET to inc R ant rotation; PROM for R hip ext    Manual Traction Long leg distraction on R for lumbar pump x 2 min                    PT Short Term Goals - 10/29/19 1542      PT SHORT TERM GOAL #1   Title Pt to be independent with initial HEP             PT Long Term Goals - 10/29/19 1544      PT LONG TERM GOAL #1   Title Pt to be independent with final HEP    Time 6    Period Weeks    Status New    Target Date 12/10/19      PT LONG TERM GOAL #2   Title Pt to report decreased pain in lumbar region to 0-2/10    Time 6    Period Weeks    Status New    Target Date 12/10/19      PT LONG TERM GOAL #3   Title Pt to report ability for sitting at least 30 min without pain.    Time 6    Period Weeks    Status New    Target Date 12/10/19      PT LONG TERM GOAL #4   Title Pt to demo proper lift/squat technique to improve ability and safety with IADLs.    Time 6    Period Weeks    Status New    Target Date 12/10/19                  Plan - 11/18/19 2055    Clinical Impression Statement Pt with mild pain relief since last visit. Added MET for pelvic rotation today, most pain at R SI region. Pain also noted with hip ext on R.  Plan to progress as tolerated.    Personal Factors and Comorbidities Time since onset of injury/illness/exacerbation    Examination-Activity Limitations Locomotion Level;Sit;Carry;Sleep;Lift;Stand;Bend    Examination-Participation Restrictions Meal Prep;Cleaning;Community Activity;Driving;Yard Work    Stability/Clinical Decision Making Stable/Uncomplicated    Rehab Potential Good    PT Frequency 2x / week    PT Duration 6 weeks    PT Treatment/Interventions ADLs/Self Care Home Management;Cryotherapy;Moist Heat;Traction;Gait training;Neuromuscular re-education;Balance training;Therapeutic exercise;Therapeutic activities;Functional mobility training;Stair training;Patient/family education;Orthotic Fit/Training;Manual techniques;Taping;Dry needling;Passive range of motion;Spinal Manipulations;Joint Manipulations    Consulted and Agree with Plan of Care Patient           Patient will benefit from skilled therapeutic intervention in order to improve the following deficits and impairments:  Decreased range of motion, Increased muscle spasms, Decreased activity tolerance, Pain, Impaired flexibility, Improper body mechanics, Decreased mobility, Decreased strength  Visit Diagnosis: Chronic bilateral low back pain without sciatica     Problem List Patient Active Problem List   Diagnosis Date Noted  . Hyperlipidemia LDL goal <70 06/18/2019  . Nonischemic cardiomyopathy ([place]) 05/13/2019  . Cardiac arrest with ventricular fibrillation ([place]) 05/13/2019  . 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 Hypertrophic scar    Lyndee Hensen, PT, DPT 9:10 PM  11/18/19    [facility] [place], [place],  [address] Phone: [phone]   Fax:  [phone]  Name: Kurt Reilly MRN: [id number] Date of  Birth: 1950/05/30   PHYSICAL THERAPY DISCHARGE SUMMARY  Visits from Start of Care: 4 Plan: Patient agrees to discharge.  Patient goals were partially met. Patient is being discharged due to not returning since the last visit.  ?????     Lyndee Hensen, PT, DPT 3:32 PM  07/22/20

## 2019-11-20 ENCOUNTER — Other Ambulatory Visit: Payer: Self-pay

## 2019-11-20 ENCOUNTER — Encounter: Payer: 59 | Admitting: Physical Therapy

## 2019-11-20 MED ORDER — SPIRONOLACTONE 25 MG PO TABS
25.0000 mg | ORAL_TABLET | Freq: Every day | ORAL | 3 refills | Status: DC
Start: 2019-11-20 — End: 2020-01-07

## 2019-11-20 NOTE — Telephone Encounter (Signed)
Pt calling requesting a refill on Spironolactone. Dr. Tamala Julian did not prescribe this medication. Would Dr. Tamala Julian like to refill this medication? Please address

## 2019-11-20 NOTE — Telephone Encounter (Signed)
Ok to fill 

## 2019-11-25 ENCOUNTER — Other Ambulatory Visit: Payer: Self-pay

## 2019-11-25 ENCOUNTER — Ambulatory Visit: Payer: 59 | Admitting: Nurse Practitioner

## 2019-11-25 ENCOUNTER — Other Ambulatory Visit: Payer: 59

## 2019-11-25 DIAGNOSIS — I2510 Atherosclerotic heart disease of native coronary artery without angina pectoris: Secondary | ICD-10-CM

## 2019-11-25 DIAGNOSIS — I5022 Chronic systolic (congestive) heart failure: Secondary | ICD-10-CM

## 2019-11-25 LAB — BASIC METABOLIC PANEL
BUN/Creatinine Ratio: 28 — ABNORMAL HIGH (ref 10–24)
BUN: 34 mg/dL — ABNORMAL HIGH (ref 8–27)
CO2: 21 mmol/L (ref 20–29)
Calcium: 9.5 mg/dL (ref 8.6–10.2)
Chloride: 106 mmol/L (ref 96–106)
Creatinine, Ser: 1.22 mg/dL (ref 0.76–1.27)
GFR calc Af Amer: 70 mL/min/{1.73_m2} (ref >59)
GFR calc non Af Amer: 61 mL/min/{1.73_m2} (ref >59)
Glucose: 116 mg/dL — ABNORMAL HIGH (ref 65–99)
Potassium: 4.2 mmol/L (ref 3.5–5.2)
Sodium: 139 mmol/L (ref 134–144)

## 2019-11-26 ENCOUNTER — Telehealth: Payer: Self-pay | Admitting: Interventional Cardiology

## 2019-11-26 NOTE — Telephone Encounter (Signed)
    Patient calling for lab results 

## 2019-11-26 NOTE — Telephone Encounter (Signed)
Spoke with pt and made him aware of lab results.  Pt appreciative for call.

## 2019-12-01 ENCOUNTER — Other Ambulatory Visit: Payer: Self-pay | Admitting: Physician Assistant

## 2019-12-01 DIAGNOSIS — G8929 Other chronic pain: Secondary | ICD-10-CM

## 2020-01-06 NOTE — Progress Notes (Signed)
Cardiology Office Note:    Date:  01/07/2020   ID:  Kurt Reilly, DOB September 21, 1950, MRN [id number]  PCP:  Inda Coke, PA  Cardiologist:  Sinclair Grooms, MD   Referring MD: Inda Coke, [place]   Chief Complaint  Patient presents with  . Coronary Artery Disease  . Congestive Heart Failure    History of Present Illness:    Kurt Reilly is a 69 y.o. male with a hx of CAD with anterior MI/cardiac arrest [id number] with 100% LADwith PCI - hadassociated cardiogenic shock/failure to wean - then hadstaged PCI to LCX and ostial LAD in June 2016(total of 2 stents to the LAD) [facility], chronic systolic HF, ICM,underlyingICD (subcutaneous 2017),HLD, HTN and ED. EF was less than 35% -->improved to 40 to 45% --> most recent EF 25% (07/2019)  Reduced energy since starting Entresto.  No change in breathing.  No lightheadedness, dizziness, or syncope.  Has not noticed lower extremity swelling, orthopnea, or PND.  Denies chest pain.  Past Medical History:  Diagnosis Date  . Actinic keratosis   . Alkaline phosphatase raised   . Anemia   . Cardiac arrhythmia   . Cardiomyopathy ([place])   . CHF (congestive heart failure) ([place])   . Coronary arteriosclerosis   . ED (erectile dysfunction)   . Gout 03/09/2017  . Hemangioma 08/24/2018  . Hyperlipidemia   . Hypertension   . Hypertrophic scar   . Malignant neoplasm of prostate ([place]) 2013   Was told that less than 1% of cancer  . Melanocytic nevi of trunk   . Melanocytic nevus of skin   . Senile hyperkeratosis   . STEMI (ST elevation myocardial infarction) ([place]) 2016    Past Surgical History:  Procedure Laterality Date  . ICD IMPLANT    . PROSTATECTOMY  2013    Current Medications: Current Meds  Medication Sig  . allopurinol (ZYLOPRIM) 300 MG tablet TAKE 1 TABLET BY MOUTH EVERY DAY  . Aspirin Buf,CaCarb-MgCarb-MgO, 81 MG TABS Take 81 mg by mouth daily.  . B Complex-C (B-COMPLEX WITH VITAMIN C) tablet Take 1 tablet by  mouth daily.  Marland Kitchen BRILINTA 60 MG TABS tablet TAKE 1 TABLET BY MOUTH EVERY 12 HOURS  . carvedilol (COREG) 6.25 MG tablet Take 6.25 mg by mouth 2 (two) times daily with a meal.  . Cholecalciferol (VITAMIN D-3) 25 MCG (1000 UT) CAPS Take by mouth daily.   . Colchicine (MITIGARE) 0.6 MG CAPS Take 0.6 mg by mouth as needed.  . Evolocumab (REPATHA SURECLICK) 650 MG/ML SOAJ Inject 140 mg into the skin every 14 (fourteen) days.  . ferrous sulfate 325 (65 FE) MG tablet Take 325 mg by mouth every other day.   Marland Kitchen MILK THISTLE PO Take 525 mg by mouth every other day.   . nitroGLYCERIN (NITROSTAT) 0.4 MG SL tablet Place 1 tablet (0.4 mg total) under the tongue every 5 (five) minutes as needed for chest pain.  . sacubitril-valsartan (ENTRESTO) 49-51 MG Take 1 tablet by mouth 2 (two) times daily.  Marland Kitchen spironolactone (ALDACTONE) 25 MG tablet Take 0.5 tablets (12.5 mg total) by mouth daily.  . [DISCONTINUED] spironolactone (ALDACTONE) 25 MG tablet Take 1 tablet (25 mg total) by mouth daily.     Allergies:   Ezetimibe, Statins, and Penicillins   Social History   Socioeconomic History  . Marital status: Married    Spouse name: Not on file  . Number of children: Not on file  . Years of education: Not on file  .  Highest education level: Not on file  Occupational History  . Not on file  Tobacco Use  . Smoking status: Never Smoker  . Smokeless tobacco: Never Used  Substance and Sexual Activity  . Alcohol use: Yes    Comment: 2 glasses of wine per week  . Drug use: Not on file  . Sexual activity: Not on file  Other Topics Concern  . Not on file  Social History Narrative   Moved to [place] in Oct 2020 to be closer to grandchildren   Retired IT sales professional, Clinical biochemist, Training and development officer   Married   3 sons   Social Determinants of Radio broadcast assistant Strain:   . Difficulty of Paying Living Expenses: Not on file  Food Insecurity:   . Worried About Charity fundraiser in the Last Year: Not on file   . Ran Out of Food in the Last Year: Not on file  Transportation Needs:   . Lack of Transportation (Medical): Not on file  . Lack of Transportation (Non-Medical): Not on file  Physical Activity:   . Days of Exercise per Week: Not on file  . Minutes of Exercise per Session: Not on file  Stress:   . Feeling of Stress : Not on file  Social Connections:   . Frequency of Communication with Friends and Family: Not on file  . Frequency of Social Gatherings with Friends and Family: Not on file  . Attends Religious Services: Not on file  . Active Member of Clubs or Organizations: Not on file  . Attends Archivist Meetings: Not on file  . Marital Status: Not on file     Family History: The patient's family history includes Cancer in his father; High Cholesterol in his father; Hypertension in his father; Migraines in his son; Yves Dill Parkinson White syndrome in his son.  ROS:   Please see the history of present illness.    Worries that his COVID-19 vaccine causes heart to get weak.  We spent time dispelling that.  All other systems reviewed and are negative.  EKGs/Labs/Other Studies Reviewed:    The following studies were reviewed today: No new data  EKG:  EKG no new data  Recent Labs: 07/24/2019: ALT 22 07/30/2019: Hemoglobin 13.9; Platelets 261 11/25/2019: BUN 34; Creatinine, Ser 1.22; Potassium 4.2; Sodium 139  Recent Lipid Panel    Component Value Date/Time   CHOL 133 07/24/2019 0715   TRIG 195 (H) 07/24/2019 0715   HDL 39 (L) 07/24/2019 0715   CHOLHDL 3.4 07/24/2019 0715   LDLCALC 62 07/24/2019 0715    Physical Exam:    VS:  BP 118/68   Pulse 70   Ht 5\' 6"  (1.676 m)   Wt 166 lb (75.3 kg)   SpO2 99%   BMI 26.79 kg/m     Wt Readings from Last 3 Encounters:  01/07/20 166 lb (75.3 kg)  10/13/19 164 lb (74.4 kg)  08/25/19 159 lb 12.8 oz (72.5 kg)     GEN: Healthy-appearing. No acute distress HEENT: Normal NECK: No JVD. LYMPHATICS: No  lymphadenopathy CARDIAC:  RRR without murmur, gallop, or edema. VASCULAR:  Normal Pulses. No bruits. RESPIRATORY:  Clear to auscultation without rales, wheezing or rhonchi  ABDOMEN: Soft, non-tender, non-distended, No pulsatile mass, MUSCULOSKELETAL: No deformity  SKIN: Warm and dry NEUROLOGIC:  Alert and oriented x 3 PSYCHIATRIC:  Normal affect   ASSESSMENT:    1. Coronary artery disease involving native coronary artery of native heart without angina pectoris  2. Chronic systolic heart failure ([place])   3. ICD (implantable cardioverter-defibrillator) in place   4. Hyperlipidemia LDL goal <70   5. Nonischemic cardiomyopathy ([place])   6. Essential hypertension   7. Cardiac arrest with ventricular fibrillation ([place])   8. Educated about COVID-19 virus infection    PLAN:    In order of problems listed above:  1. Secondary prevention discussed. 2. Decrease spironolactone to 12.5 mg daily.  Add dapagliflozin 10 mg/day.  Basic metabolic panel in 2 weeks.  86-month follow-up with echo preceding office visit. 3. No comments. 4. Continue same therapy. 5. 4 prong therapy for decreased LV function 6. Blood pressure control is excellent.  Continue Aldactone but at 12.5 mg daily, Entresto 49/51 mg twice daily, carvedilol 6.25 mg twice daily, and low-salt diet. 7. Not discussed 8. Vaccinated and Audiological scientist.    Medication Adjustments/Labs and Tests Ordered: Current medicines are reviewed at length with the patient today.  Concerns regarding medicines are outlined above.  Orders Placed This Encounter  Procedures  . Basic metabolic panel  . ECHOCARDIOGRAM COMPLETE   Meds ordered this encounter  Medications  . dapagliflozin propanediol (FARXIGA) 10 MG TABS tablet    Sig: Take 1 tablet (10 mg total) by mouth daily.    Dispense:  30 tablet    Refill:  11  . spironolactone (ALDACTONE) 25 MG tablet    Sig: Take 0.5 tablets (12.5 mg total) by mouth daily.    Dispense:  45 tablet     Refill:  3    Dose change    Patient Instructions  Medication Instructions:  1) DECREASE Spironolactone to 12.5mg  once daily 2) START Farxiga 10mg  once daily  *If you need a refill on your cardiac medications before your next appointment, please call your pharmacy*   Lab Work: BMET in 2 weeks  If you have labs (blood work) drawn today and your tests are completely normal, you will receive your results only by: Marland Kitchen MyChart Message (if you have MyChart) OR . A paper copy in the mail If you have any lab test that is abnormal or we need to change your treatment, we will call you to review the results.   Testing/Procedures: Your physician has requested that you have an echocardiogram. Echocardiography is a painless test that uses sound waves to create images of your heart. It provides your doctor with information about the size and shape of your heart and how well your heart's chambers and valves are working. This procedure takes approximately one hour. There are no restrictions for this procedure.     Follow-Up: At [facility], you and your health needs are our priority.  As part of our continuing mission to provide you with exceptional heart care, we have created designated Provider Care Teams.  These Care Teams include your primary Cardiologist (physician) and Advanced Practice Providers (APPs -  Physician Assistants and Nurse Practitioners) who all work together to provide you with the care you need, when you need it.  We recommend signing up for the patient portal called "MyChart".  Sign up information is provided on this After Visit Summary.  MyChart is used to connect with patients for Virtual Visits (Telemedicine).  Patients are able to view lab/test results, encounter notes, upcoming appointments, etc.  Non-urgent messages can be sent to your provider as well.   To learn more about what you can do with MyChart, go to [email or web address].    Your next appointment:   3  month(s)  The  format for your next appointment:   In Person  Provider:   You may see Sinclair Grooms, MD or one of the following Advanced Practice Providers on your designated Care Team:    Truitt Merle, NP  Cecilie Kicks, NP  Kathyrn Drown, NP    Other Instructions      Signed, Sinclair Grooms, MD  01/07/2020 11:40 AM    [place]

## 2020-01-07 ENCOUNTER — Encounter: Payer: Self-pay | Admitting: Interventional Cardiology

## 2020-01-07 ENCOUNTER — Other Ambulatory Visit: Payer: Self-pay

## 2020-01-07 ENCOUNTER — Other Ambulatory Visit: Payer: Self-pay | Admitting: Interventional Cardiology

## 2020-01-07 ENCOUNTER — Ambulatory Visit (INDEPENDENT_AMBULATORY_CARE_PROVIDER_SITE_OTHER): Payer: 59 | Admitting: Interventional Cardiology

## 2020-01-07 VITALS — BP 118/68 | HR 70 | Ht 66.0 in | Wt 166.0 lb

## 2020-01-07 DIAGNOSIS — I255 Ischemic cardiomyopathy: Secondary | ICD-10-CM

## 2020-01-07 DIAGNOSIS — I2510 Atherosclerotic heart disease of native coronary artery without angina pectoris: Secondary | ICD-10-CM | POA: Diagnosis not present

## 2020-01-07 DIAGNOSIS — I469 Cardiac arrest, cause unspecified: Secondary | ICD-10-CM

## 2020-01-07 DIAGNOSIS — I10 Essential (primary) hypertension: Secondary | ICD-10-CM

## 2020-01-07 DIAGNOSIS — I428 Other cardiomyopathies: Secondary | ICD-10-CM

## 2020-01-07 DIAGNOSIS — I4901 Ventricular fibrillation: Secondary | ICD-10-CM

## 2020-01-07 DIAGNOSIS — Z95810 Presence of automatic (implantable) cardiac defibrillator: Secondary | ICD-10-CM | POA: Diagnosis not present

## 2020-01-07 DIAGNOSIS — E785 Hyperlipidemia, unspecified: Secondary | ICD-10-CM

## 2020-01-07 DIAGNOSIS — Z7189 Other specified counseling: Secondary | ICD-10-CM

## 2020-01-07 DIAGNOSIS — I5022 Chronic systolic (congestive) heart failure: Secondary | ICD-10-CM

## 2020-01-07 MED ORDER — DAPAGLIFLOZIN PROPANEDIOL 10 MG PO TABS: 10.0000 mg | ORAL_TABLET | Freq: Every day | ORAL | 11 refills | Status: DC

## 2020-01-07 MED ORDER — SPIRONOLACTONE 25 MG PO TABS
12.5000 mg | ORAL_TABLET | Freq: Every day | ORAL | 3 refills | Status: DC
Start: 2020-01-07 — End: 2020-04-06

## 2020-01-07 NOTE — Patient Instructions (Signed)
Medication Instructions:  1) DECREASE Spironolactone to 12.5mg  once daily 2) START Farxiga 10mg  once daily  *If you need a refill on your cardiac medications before your next appointment, please call your pharmacy*   Lab Work: BMET in 2 weeks  If you have labs (blood work) drawn today and your tests are completely normal, you will receive your results only by: Marland Kitchen MyChart Message (if you have MyChart) OR . A paper copy in the mail If you have any lab test that is abnormal or we need to change your treatment, we will call you to review the results.   Testing/Procedures: Your physician has requested that you have an echocardiogram. Echocardiography is a painless test that uses sound waves to create images of your heart. It provides your doctor with information about the size and shape of your heart and how well your heart's chambers and valves are working. This procedure takes approximately one hour. There are no restrictions for this procedure.     Follow-Up: At [facility], you and your health needs are our priority.  As part of our continuing mission to provide you with exceptional heart care, we have created designated Provider Care Teams.  These Care Teams include your primary Cardiologist (physician) and Advanced Practice Providers (APPs -  Physician Assistants and Nurse Practitioners) who all work together to provide you with the care you need, when you need it.  We recommend signing up for the patient portal called "MyChart".  Sign up information is provided on this After Visit Summary.  MyChart is used to connect with patients for Virtual Visits (Telemedicine).  Patients are able to view lab/test results, encounter notes, upcoming appointments, etc.  Non-urgent messages can be sent to your provider as well.   To learn more about what you can do with MyChart, go to [email or web address].    Your next appointment:   3 month(s)  The format for your next appointment:   In  Person  Provider:   You may see Sinclair Grooms, MD or one of the following Advanced Practice Providers on your designated Care Team:    Truitt Merle, NP  Cecilie Kicks, NP  Kathyrn Drown, NP    Other Instructions

## 2020-01-13 ENCOUNTER — Telehealth: Payer: Self-pay | Admitting: Interventional Cardiology

## 2020-01-13 NOTE — Telephone Encounter (Signed)
Spoke with pt and encouraged him to start the [place].  Explained the importance and the reason for Dr. Tamala Julian wanting to start this medication.  Pt states with his "situation" right now, he just can't take on another medication.  Reiterated the reason we were wanting to start this medication.  Asked pt about what may be preventing him from starting this medication because I thought it might be cost related and was going to discuss pt assistance.  Pt stated that he did not want to go into detail about it right now.  States he and his wife discussed and now is just not a good time.  Advised I will make Dr. Tamala Julian aware.  Wilder Glade removed from med list.

## 2020-01-13 NOTE — Telephone Encounter (Signed)
Kurt Reilly is calling stating he is not going to take the new prescription he received until after he receives his Echo results to see how his body reacted to [facility] first. He was unsure of the name of the medication, but stated Anderson Malta would know. Please advise.

## 2020-01-16 NOTE — Telephone Encounter (Signed)
Thanks

## 2020-01-21 ENCOUNTER — Other Ambulatory Visit: Payer: 59

## 2020-02-06 ENCOUNTER — Other Ambulatory Visit: Payer: Self-pay

## 2020-02-06 ENCOUNTER — Encounter: Payer: Self-pay | Admitting: Physician Assistant

## 2020-02-06 ENCOUNTER — Ambulatory Visit (INDEPENDENT_AMBULATORY_CARE_PROVIDER_SITE_OTHER): Payer: 59 | Admitting: Physician Assistant

## 2020-02-06 VITALS — BP 120/80 | HR 76 | Temp 97.2°F | Wt 164.6 lb

## 2020-02-06 DIAGNOSIS — Z8546 Personal history of malignant neoplasm of prostate: Secondary | ICD-10-CM

## 2020-02-06 DIAGNOSIS — Z1159 Encounter for screening for other viral diseases: Secondary | ICD-10-CM

## 2020-02-06 DIAGNOSIS — Z23 Encounter for immunization: Secondary | ICD-10-CM | POA: Diagnosis not present

## 2020-02-06 DIAGNOSIS — G8929 Other chronic pain: Secondary | ICD-10-CM | POA: Diagnosis not present

## 2020-02-06 DIAGNOSIS — M545 Low back pain, unspecified: Secondary | ICD-10-CM

## 2020-02-06 NOTE — Patient Instructions (Signed)
It was great to see you!  Flu shot done today.  Please call or send [place] a mychart message with the place where you had your colonoscopy done so we can request the records.  We will update your PSA test today and do a Hep C screening.  Take care,  Inda Coke PA-C

## 2020-02-06 NOTE — Progress Notes (Signed)
Kurt Reilly is a 69 y.o. male here for a follow up of a pre-existing problem.  History of Present Illness:   Chief Complaint  Patient presents with  . PSA check    hx prostate cancer  . Medication Management    wanting to know if OK to take Glucosamine with his current medication regimen   . Health Maintenance    flu shot given in office today    HPI   Chronic low back pain Would like to take glucosamine as this was recommended by his chiropractor. He did PT with our office for a few months without significant benefit. Last xray was in Aug 2021 -- Facet osteoarthritic change at L4-5 and L5-S1 bilaterally. No appreciable disc space narrowing. No fracture or spondylolisthesis. Denies any new or worsening symptoms, including urinary/bowel incontinence, bony pain.  Hx of prostate cancer S/p prostatectomy in 2013. He would like to update PSA level today as it has been years since this was done. Does not have local urologist. Denies any symptoms including hematuria, dysuria, pelvic or rectal pressure.    Past Medical History:  Diagnosis Date  . Actinic keratosis   . Alkaline phosphatase raised   . Anemia   . Cardiac arrhythmia   . Cardiomyopathy ([place])   . CHF (congestive heart failure) ([place])   . Coronary arteriosclerosis   . ED (erectile dysfunction)   . Gout 03/09/2017  . Hemangioma 08/24/2018  . Hyperlipidemia   . Hypertension   . Hypertrophic scar   . Malignant neoplasm of prostate ([place]) 2013   Was told that less than 1% of cancer  . Melanocytic nevi of trunk   . Melanocytic nevus of skin   . Senile hyperkeratosis   . STEMI (ST elevation myocardial infarction) ([place]) 2016     Social History   Tobacco Use  . Smoking status: Never Smoker  . Smokeless tobacco: Never Used  Substance Use Topics  . Alcohol use: Yes    Comment: 2 glasses of wine per week  . Drug use: Not on file    Past Surgical History:  Procedure Laterality Date  . ICD IMPLANT    . PROSTATECTOMY   2013    Family History  Problem Relation Age of Onset  . Cancer Father        gastric  . Hypertension Father   . High Cholesterol Father   . Yves Dill Parkinson White syndrome Son   . Migraines Son     Allergies  Allergen Reactions  . Ezetimibe Other (See Comments)  . Statins Other (See Comments)    Myalgia  . Penicillins Rash    Occurred as a child - tolerated Zosyn June 2016     Current Medications:   Current Outpatient Medications:  .  allopurinol (ZYLOPRIM) 300 MG tablet, TAKE 1 TABLET BY MOUTH EVERY DAY, Disp: 90 tablet, Rfl: 0 .  Aspirin Buf,CaCarb-MgCarb-MgO, 81 MG TABS, Take 81 mg by mouth daily., Disp: , Rfl:  .  B Complex-C (B-COMPLEX WITH VITAMIN C) tablet, Take 1 tablet by mouth daily., Disp: , Rfl:  .  BRILINTA 60 MG TABS tablet, TAKE 1 TABLET BY MOUTH EVERY 12 HOURS, Disp: 180 tablet, Rfl: 3 .  carvedilol (COREG) 6.25 MG tablet, Take 6.25 mg by mouth 2 (two) times daily with a meal., Disp: , Rfl:  .  Cholecalciferol (VITAMIN D-3) 25 MCG (1000 UT) CAPS, Take by mouth daily. , Disp: , Rfl:  .  Colchicine (MITIGARE) 0.6 MG CAPS, Take 0.6 mg by  mouth as needed., Disp: , Rfl:  .  Evolocumab (REPATHA SURECLICK) 478 MG/ML SOAJ, Inject 140 mg into the skin every 14 (fourteen) days., Disp: 6 pen, Rfl: 3 .  ferrous sulfate 325 (65 FE) MG tablet, Take 325 mg by mouth every other day. , Disp: , Rfl:  .  MILK THISTLE PO, Take 525 mg by mouth every other day. , Disp: , Rfl:  .  nitroGLYCERIN (NITROSTAT) 0.4 MG SL tablet, Place 1 tablet (0.4 mg total) under the tongue every 5 (five) minutes as needed for chest pain., Disp: 25 tablet, Rfl: 6 .  sacubitril-valsartan (ENTRESTO) 49-51 MG, Take 1 tablet by mouth 2 (two) times daily., Disp: 180 tablet, Rfl: 3 .  spironolactone (ALDACTONE) 25 MG tablet, Take 0.5 tablets (12.5 mg total) by mouth daily., Disp: 45 tablet, Rfl: 3   Review of Systems:   ROS Negative unless otherwise specified per HPI.  Vitals:   Vitals:   02/06/20 1034   BP: 120/80  Pulse: 76  Temp: (!) 97.2 F (36.2 C)  TempSrc: Temporal  SpO2: 98%  Weight: 164 lb 9.6 oz (74.7 kg)     Body mass index is 26.57 kg/m.  Physical Exam:   Physical Exam Vitals and nursing note reviewed.  Constitutional:      General: He is not in acute distress.    Appearance: He is well-developed. He is not ill-appearing or toxic-appearing.  Cardiovascular:     Rate and Rhythm: Normal rate and regular rhythm.     Pulses: Normal pulses.     Heart sounds: Normal heart sounds, S1 normal and S2 normal.     Comments: No LE edema Pulmonary:     Effort: Pulmonary effort is normal.     Breath sounds: Normal breath sounds.  Skin:    General: Skin is warm and dry.  Neurological:     Mental Status: He is alert.     GCS: GCS eye subscore is 4. GCS verbal subscore is 5. GCS motor subscore is 6.  Psychiatric:        Speech: Speech normal.        Behavior: Behavior normal. Behavior is cooperative.     Assessment and Plan:   Kurt Reilly was seen today for psa check, medication management and health maintenance.  Diagnoses and all orders for this visit:  Personal history of prostate cancer Update PSA today. If any concerns, he is agreeable to urology referral. -     PSA; Future  Chronic bilateral low back pain without sciatica Denies any acute concerns, symptoms. NO red flags. Discussed use of OTC supplements is difficult to assess given lack of FDA approval/review. Likely okay to take glucosamine. If any concerning symptoms develop with this, recommend cessation of supplement.  Need for immunization against influenza -     Flu Vaccine QUAD High Dose(Fluad)  Encounter for screening for other viral diseases -     Hepatitis C Antibody; Future  Inda Coke, PA-C

## 2020-02-08 ENCOUNTER — Other Ambulatory Visit: Payer: Self-pay | Admitting: Physician Assistant

## 2020-02-08 DIAGNOSIS — Z8546 Personal history of malignant neoplasm of prostate: Secondary | ICD-10-CM

## 2020-02-08 DIAGNOSIS — R972 Elevated prostate specific antigen [PSA]: Secondary | ICD-10-CM

## 2020-02-09 LAB — HEPATITIS C ANTIBODY
Hepatitis C Ab: NONREACTIVE
SIGNAL TO CUT-OFF: 0.01 (ref <1.00)

## 2020-02-09 LAB — PSA: PSA: 0.46 ng/mL (ref <=4.0)

## 2020-02-13 ENCOUNTER — Telehealth: Payer: Self-pay | Admitting: Interventional Cardiology

## 2020-02-13 NOTE — Telephone Encounter (Signed)
Left message to call back  

## 2020-02-13 NOTE — Telephone Encounter (Signed)
STAT if HR is under 50 or over 120 (normal HR is 60-100 beats per minute)  1) What is your heart rate? 70  2) Do you have a log of your heart rate readings (document readings)? 110-115  3) Do you have any other symptoms? No   Kurt Reilly is calling stating for the past month he has been having a high HR when he exerts himself on an incline. Please advise.

## 2020-02-16 NOTE — Telephone Encounter (Signed)
Pt states the other day he was going up 15 stairs and carrying a card table and became winded.  Resolved quickly with rest.  Denies CP, lightheadedness, dizziness, or diaphoresis.  Discussed pt's EF with him.  Advised pt to continue to monitor and to definitely call if he develops CP or lightheadedness when exerting.  Advised if either develop, stop what he is doing and rest and then have someone assist him.  Pt verbalized understanding and was in agreement with this plan.

## 2020-02-26 ENCOUNTER — Other Ambulatory Visit: Payer: Self-pay

## 2020-02-26 MED ORDER — CARVEDILOL 6.25 MG PO TABS
6.2500 mg | ORAL_TABLET | Freq: Two times a day (BID) | ORAL | 3 refills | Status: DC
Start: 2020-02-26 — End: 2020-04-06

## 2020-02-26 NOTE — Telephone Encounter (Signed)
Pt's medication was sent to pt's pharmacy as requested. Confirmation received.  °

## 2020-03-23 ENCOUNTER — Telehealth: Payer: Self-pay | Admitting: Interventional Cardiology

## 2020-03-23 ENCOUNTER — Other Ambulatory Visit: Payer: Self-pay | Admitting: Physician Assistant

## 2020-03-23 DIAGNOSIS — G8929 Other chronic pain: Secondary | ICD-10-CM

## 2020-03-23 DIAGNOSIS — M545 Low back pain, unspecified: Secondary | ICD-10-CM

## 2020-03-23 NOTE — Telephone Encounter (Signed)
Per [employer], it is safe under normal use. Patient called and advised. Advised to call with further questions or concerns.

## 2020-03-23 NOTE — Telephone Encounter (Signed)
New Message:    Pt wants to know if he can have a PET scan, since he have a Defibrillator?

## 2020-04-06 ENCOUNTER — Telehealth: Payer: Self-pay | Admitting: Interventional Cardiology

## 2020-04-06 ENCOUNTER — Encounter: Payer: Self-pay | Admitting: Physician Assistant

## 2020-04-06 ENCOUNTER — Telehealth (INDEPENDENT_AMBULATORY_CARE_PROVIDER_SITE_OTHER): Payer: 59 | Admitting: Physician Assistant

## 2020-04-06 VITALS — BP 90/70 | HR 82 | Ht 66.0 in | Wt 157.0 lb

## 2020-04-06 DIAGNOSIS — U071 COVID-19: Secondary | ICD-10-CM | POA: Diagnosis not present

## 2020-04-06 MED ORDER — CARVEDILOL 6.25 MG PO TABS
3.1250 mg | ORAL_TABLET | Freq: Two times a day (BID) | ORAL | 3 refills | Status: DC
Start: 2020-04-06 — End: 2020-04-12

## 2020-04-06 NOTE — Telephone Encounter (Signed)
Spoke with Dr. Tamala Julian and reviewed information.  He said let's stop Spironolactone, decrease Coreg to 3.125mg  BID and cut Entresto in half until he starts feeling better and BPs start to return back to normal.  Spoke with wife and advised her of this information.  She will continue to monitor BP and let us know when it's starting to go up as he recovers.

## 2020-04-06 NOTE — Telephone Encounter (Signed)
Pt diagnosed with covid a few days ago.  Pt just not really starting to develop symptoms.  Didn't eat much for supper last night because he wasn't feeling well.  Woke up this morning and took his meds, made coffee and decided to go take a shower.  Pt almost passed out in the shower.  Wife was able to help him but called 911 because he was very pale.  EKG and O2 were normal.  BP was low though.  EMS checked it sitting and it was 91/58, standing 80/50.  BP a little before noon was 101/58.  BP at 2:30pm was 110/73 sitting, 109/70 standing.  HR has consistently been in the 70s.  This morning when BP was 80/50, pt did have nausea, dizziness and confusion.  These have since resolved.  PCP did put in a referral for antibodies but they have to wait to hear back on that.  Advised I will speak with Dr. Tamala Julian and contact her afterwards.  Wife appreciative for call.

## 2020-04-06 NOTE — Telephone Encounter (Signed)
Pt c/o BP issue: STAT if pt c/o blurred vision, one-sided weakness or slurred speech  1. What are your last 5 BP readings?   Standing 04/06/20: 90/55 80/50  Sitting 04/06/20:  101/60  2. Are you having any other symptoms (ex. Dizziness, headache, blurred vision, passed out)? No  3. What is your BP issue? Patient's wife states the patient is COVID positive and it is causing his BP to decrease. She is requesting to speak with Dr. Thompson Caul nurse.

## 2020-04-06 NOTE — Progress Notes (Signed)
Virtual Visit via Video   I connected with Kurt Reilly on 04/06/20 at  2:30 PM EST by a video enabled telemedicine application and verified that I am speaking with the correct person using two identifiers. Location patient: Home Location provider: [facility], Office Persons participating in the virtual visit: Kurt Reilly, [place] PA-C  I discussed the limitations of evaluation and management by telemedicine and the availability of in person appointments. The patient expressed understanding and agreed to proceed.   Subjective:   HPI:   Patient is requesting evaluation for possible COVID-19.  Symptom onset: 04/05/2020  Travel/contacts: wife has covid, positive last Friday 01/28  Vaccination status: 2 shots  Testing results: positive home test yesterday  Patient endorses the following symptoms: Sore throat, fever 99 yesterday, coughing and expectorating green sputum  Patient denies the following symptoms: Chest pain, SOB, nausea, diarrhea   Was lightheaded this morning and called EMS. BP was 80/50 when EMS came but improved to 90/70. Oxygen 96%. He is feeling significantly better. Reports that he is eating and drinking well.  Treatments tried: Mucinex DM  Patient risk factors: Current [id number] risk of complications score: 5 Smoking status: Kurt Reilly  reports that he has never smoked. He has never used smokeless tobacco. If male, currently pregnant? []   Yes []   No  ROS: See pertinent positives and negatives per HPI.  Patient Active Problem List   Diagnosis Date Noted  . 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 Hypertrophic scar   . Hyperglycemia 11/20/2016  . 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 Cardiomyopathy ([place]) 08/25/2015  . Hyperlipidemia, unspecified 08/25/2015  . Acute systolic CHF (congestive heart failure) ([place]) 08/25/2015  . CAD (coronary artery disease) 08/25/2015  . Anemia, unspecified  02/14/2015  . Essential (primary) hypertension 09/14/2014  . S/P primary angioplasty with coronary stent 09/14/2014  . Personal history of sudden cardiac arrest 09/10/2014  . Gout 04/16/2013  . Vitamin D deficiency 02/10/2011  . History of prostate cancer 11/12/2009    Social History   Tobacco Use  . Smoking status: Never Smoker  . Smokeless tobacco: Never Used  Substance Use Topics  . Alcohol use: Yes    Comment: 2 glasses of wine per week    Current Outpatient Medications:  .  allopurinol (ZYLOPRIM) 300 MG tablet, TAKE 1 TABLET BY MOUTH EVERY DAY, Disp: 90 tablet, Rfl: 0 .  Aspirin Buf,CaCarb-MgCarb-MgO, 81 MG TABS, Take 81 mg by mouth daily., Disp: , Rfl:  .  B Complex-C (B-COMPLEX WITH VITAMIN C) tablet, Take 1 tablet by mouth daily., Disp: , Rfl:  .  BRILINTA 60 MG TABS tablet, TAKE 1 TABLET BY MOUTH EVERY 12 HOURS, Disp: 180 tablet, Rfl: 3 .  carvedilol (COREG) 6.25 MG tablet, Take 1 tablet (6.25 mg total) by mouth 2 (two) times daily with a meal., Disp: 180 tablet, Rfl: 3 .  Cholecalciferol (VITAMIN D-3) 25 MCG (1000 UT) CAPS, Take by mouth daily. , Disp: , Rfl:  .  Colchicine 0.6 MG CAPS, Take 0.6 mg by mouth as needed., Disp: , Rfl:  .  Evolocumab (REPATHA SURECLICK) 588 MG/ML SOAJ, Inject 140 mg into the skin every 14 (fourteen) days., Disp: 6 pen, Rfl: 3 .  ferrous sulfate 325 (65 FE) MG tablet, Take 325 mg by mouth every other day. , Disp: , Rfl:  .  MILK THISTLE PO, Take 525 mg by mouth every other day. , Disp: , Rfl:  .  nitroGLYCERIN (NITROSTAT) 0.4 MG  SL tablet, Place 1 tablet (0.4 mg total) under the tongue every 5 (five) minutes as needed for chest pain., Disp: 25 tablet, Rfl: 6 .  sacubitril-valsartan (ENTRESTO) 49-51 MG, Take 1 tablet by mouth 2 (two) times daily., Disp: 180 tablet, Rfl: 3 .  spironolactone (ALDACTONE) 25 MG tablet, Take 0.5 tablets (12.5 mg total) by mouth daily., Disp: 45 tablet, Rfl: 3  Allergies  Allergen Reactions  . Ezetimibe Other (See  Comments)  . Statins Other (See Comments)    Myalgia  . Penicillins Rash    Occurred as a child - tolerated Zosyn June 2016     Objective:   VITALS: Per patient if applicable, see vitals. GENERAL: Alert, appears well and in no acute distress. HEENT: Atraumatic, conjunctiva clear, no obvious abnormalities on inspection of external nose and ears. NECK: Normal movements of the head and neck. CARDIOPULMONARY: No increased WOB. Speaking in clear sentences. I:E ratio WNL.  MS: Moves all visible extremities without noticeable abnormality. PSYCH: Pleasant and cooperative, well-groomed. Speech normal rate and rhythm. Affect is appropriate. Insight and judgement are appropriate. Attention is focused, linear, and appropriate.  NEURO: CN grossly intact. Oriented as arrived to appointment on time with no prompting. Moves both UE equally.  SKIN: No obvious lesions, wounds, erythema, or cyanosis noted on face or hands.  Assessment and Plan:   Kurt Reilly was seen today for covid positive.  Diagnoses and all orders for this visit:  COVID-19 -     Ambulatory referral for Covid Treatment    No red flags on discussion, patient is not in any obvious distress during our visit. Reviewed recommendation to continue to monitor pulse ox. If numbers consistently <94% then recommend evaluation for urgent care or ER visit. Due to hypotensive episode, I recommend that he call his cardiologist office ASAP to discuss earlier event to see if they have further recommendations. If he has recurrent of low BP or symptoms, recommend calling 9-1-1 for evaluation. I have also referred him for COVID antibody treatment.  Discussed over the counter supportive care options, including Tylenol 500 mg q 8 hours, with recommendations to push fluids and rest. Reviewed return precautions including new/worsening fever, SOB, new/worsening cough, sudden onset changes of symptoms. Recommended need to self-quarantine and practice social  distancing until symptoms resolve. I recommend that patient follow-up if symptoms worsen or persist despite treatment x 7-10 days, sooner if needed.  I discussed the assessment and treatment plan with the patient. The patient was provided an opportunity to ask questions and all were answered. The patient agreed with the plan and demonstrated an understanding of the instructions.   The patient was advised to call back or seek an in-person evaluation if the symptoms worsen or if the condition fails to improve as anticipated.   CMA or LPN served as scribe during this visit. History, Physical, and Plan performed by medical provider. The above documentation has been reviewed and is accurate and complete.   [place], [place] 04/06/2020

## 2020-04-07 ENCOUNTER — Ambulatory Visit (HOSPITAL_COMMUNITY)
Admission: RE | Admit: 2020-04-07 | Discharge: 2020-04-07 | Disposition: A | Discharge status: Home / Self Care | Payer: 59 | Source: Ambulatory Visit | Attending: Pulmonary Disease | Admitting: Pulmonary Disease

## 2020-04-07 ENCOUNTER — Other Ambulatory Visit: Payer: Self-pay | Admitting: Physician Assistant

## 2020-04-07 ENCOUNTER — Telehealth: Payer: Self-pay | Admitting: *Deleted

## 2020-04-07 DIAGNOSIS — U071 COVID-19: Secondary | ICD-10-CM

## 2020-04-07 DIAGNOSIS — I5022 Chronic systolic (congestive) heart failure: Secondary | ICD-10-CM

## 2020-04-07 DIAGNOSIS — I10 Essential (primary) hypertension: Secondary | ICD-10-CM

## 2020-04-07 DIAGNOSIS — I2510 Atherosclerotic heart disease of native coronary artery without angina pectoris: Secondary | ICD-10-CM

## 2020-04-07 DIAGNOSIS — E663 Overweight: Secondary | ICD-10-CM

## 2020-04-07 MED ORDER — SODIUM CHLORIDE 0.9 % IV SOLN: INTRAVENOUS | Status: DC | PRN

## 2020-04-07 MED ORDER — DIPHENHYDRAMINE HCL 50 MG/ML IJ SOLN: 50.0000 mg | Freq: Once | INTRAMUSCULAR | Status: DC | PRN

## 2020-04-07 MED ORDER — ALBUTEROL SULFATE HFA 108 (90 BASE) MCG/ACT IN AERS: 2.0000 | INHALATION_SPRAY | Freq: Once | RESPIRATORY_TRACT | Status: DC | PRN

## 2020-04-07 MED ORDER — METHYLPREDNISOLONE SODIUM SUCC 125 MG IJ SOLR: 125.0000 mg | Freq: Once | INTRAMUSCULAR | Status: DC | PRN

## 2020-04-07 MED ORDER — EPINEPHRINE 0.3 MG/0.3ML IJ SOAJ: 0.3000 mg | Freq: Once | INTRAMUSCULAR | Status: DC | PRN

## 2020-04-07 MED ORDER — FAMOTIDINE IN NACL 20-0.9 MG/50ML-% IV SOLN: 20.0000 mg | Freq: Once | INTRAVENOUS | Status: DC | PRN

## 2020-04-07 MED ORDER — SOTROVIMAB 500 MG/8ML IV SOLN
500.0000 mg | Freq: Once | INTRAVENOUS | Status: AC
  Administered 2020-04-07: 500 mg via INTRAVENOUS

## 2020-04-07 NOTE — Progress Notes (Signed)
I connected by phone with Kurt Reilly on 04/07/2020 at 12:44 PM to discuss the potential use of a new treatment for mild to moderate COVID-19 viral infection in non-hospitalized patients.  This patient is a 70 y.o. male that meets the FDA criteria for Emergency Use Authorization of COVID monoclonal antibody sotrovimab.  Has a (+) direct SARS-CoV-2 viral test result  Has mild or moderate COVID-19   Is NOT hospitalized due to COVID-19  Is within 10 days of symptom onset  Has at least one of the high risk factor(s) for progression to severe COVID-19 and/or hospitalization as defined in EUA.  Specific high risk criteria : Older age (>/= 68 yo), BMI > 25 and Cardiovascular disease or hypertension   I have spoken and communicated the following to the patient or parent/caregiver regarding COVID monoclonal antibody treatment:  1. FDA has authorized the emergency use for the treatment of mild to moderate COVID-19 in adults and pediatric patients with positive results of direct SARS-CoV-2 viral testing who are 32 years of age and older weighing at least 40 kg, and who are at high risk for progressing to severe COVID-19 and/or hospitalization.  2. The significant known and potential risks and benefits of COVID monoclonal antibody, and the extent to which such potential risks and benefits are unknown.  3. Information on available alternative treatments and the risks and benefits of those alternatives, including clinical trials.  4. Patients treated with COVID monoclonal antibody should continue to self-isolate and use infection control measures (e.g., wear mask, isolate, social distance, avoid sharing personal items, clean and disinfect "high touch" surfaces, and frequent handwashing) according to CDC guidelines.   5. The patient or parent/caregiver has the option to accept or refuse COVID monoclonal antibody treatment.  After reviewing this information with the patient, the patient has agreed to  receive one of the available covid 19 monoclonal antibodies and will be provided an appropriate fact sheet prior to infusion.   [place], [place] 04/07/2020 12:44 PM

## 2020-04-07 NOTE — Telephone Encounter (Signed)
Called to discuss with patient about COVID-19 symptoms and the use of one of the available treatments for those with mild to moderate Covid symptoms and at a high risk of hospitalization.  Pt appears to qualify for outpatient treatment due to co-morbid conditions and/or a member of an at-risk group in accordance with the FDA Emergency Use Authorization.   ST, fatigue, "tickle in my chest"   Symptom onset: No date given but stated today is day 7 he thinks. Vaccinated: yes Booster? No Immunocompromised? No Qualifiers: CAD, CHF, HTN  Patient would like to receive a call from an APP.   Tarry Kos

## 2020-04-07 NOTE — Progress Notes (Signed)
Diagnosis: COVID-19  Physician: Dr. Patrick Wright  Procedure: Covid Infusion Clinic Med: Sotrovimab infusion - Provided patient with sotrovimab fact sheet for patients, parents, and caregivers prior to infusion.   Complications: No immediate complications noted  Discharge: Discharged home    

## 2020-04-07 NOTE — Discharge Instructions (Signed)

## 2020-04-07 NOTE — Progress Notes (Signed)
Patient reviewed Fact Sheet for Patients, Parents, and Caregivers for Emergency Use Authorization (EUA) of sotrovimab for the Treatment of Coronavirus. Patient also reviewed and is agreeable to the estimated cost of treatment. Patient is agreeable to proceed.   

## 2020-04-12 ENCOUNTER — Telehealth: Payer: Self-pay | Admitting: Interventional Cardiology

## 2020-04-12 MED ORDER — CARVEDILOL 6.25 MG PO TABS: 6.2500 mg | ORAL_TABLET | Freq: Two times a day (BID) | ORAL | 3 refills | Status: DC

## 2020-04-12 NOTE — Telephone Encounter (Signed)
Pt c/o BP issue: STAT if pt c/o blurred vision, one-sided weakness or slurred speech  1. What are your last 5 BP readings?  130/75  2. Are you having any other symptoms (ex. Dizziness, headache, blurred vision, passed out)? No   3. What is your BP issue? Kurt Reilly is calling wanting to know if Dr. Tamala Julian would like to increase his medications back. He states he originally decreased them due to him having a fainting spell while he had Covid last week. Branch is now feeling better and his BP is beginning to rise again. Please advise.

## 2020-04-12 NOTE — Telephone Encounter (Signed)
Spoke with pt and he is feeling much better since receiving the covid antibodies.  States his BP has been creeping back up.  Highest has been 135/75.  Spoke with Dr. Tamala Julian and he said ok to go back to Coreg 6.25mg  BID.  Made pt aware.  Pt appreciative for call.

## 2020-04-15 ENCOUNTER — Other Ambulatory Visit: Payer: Self-pay | Admitting: Urology

## 2020-04-15 ENCOUNTER — Other Ambulatory Visit (HOSPITAL_COMMUNITY): Payer: Self-pay | Admitting: Urology

## 2020-04-15 DIAGNOSIS — C61 Malignant neoplasm of prostate: Secondary | ICD-10-CM

## 2020-04-21 ENCOUNTER — Other Ambulatory Visit: Payer: Self-pay

## 2020-04-21 ENCOUNTER — Ambulatory Visit (HOSPITAL_COMMUNITY): Payer: 59 | Attending: Cardiology

## 2020-04-21 DIAGNOSIS — I5022 Chronic systolic (congestive) heart failure: Secondary | ICD-10-CM

## 2020-04-21 LAB — ECHOCARDIOGRAM COMPLETE
Area-P 1/2: 3.99 cm2
S' Lateral: 3.8 cm

## 2020-04-21 MED ORDER — PERFLUTREN LIPID MICROSPHERE
1.0000 mL | INTRAVENOUS | Status: AC | PRN
  Administered 2020-04-21: 1 mL via INTRAVENOUS

## 2020-04-22 ENCOUNTER — Telehealth: Payer: Self-pay | Admitting: Interventional Cardiology

## 2020-04-22 NOTE — Telephone Encounter (Signed)
Spoke with pt and reviewed results

## 2020-04-22 NOTE — Telephone Encounter (Signed)
  Patient would like a call to discuss his echo resutls

## 2020-04-29 ENCOUNTER — Ambulatory Visit: Payer: 59 | Admitting: Interventional Cardiology

## 2020-05-03 ENCOUNTER — Encounter (HOSPITAL_COMMUNITY): Payer: 59

## 2020-05-03 ENCOUNTER — Other Ambulatory Visit (HOSPITAL_COMMUNITY): Payer: 59

## 2020-05-04 ENCOUNTER — Encounter (HOSPITAL_COMMUNITY)
Admission: RE | Admit: 2020-05-04 | Discharge: 2020-05-04 | Disposition: A | Discharge status: Home / Self Care | Payer: 59 | Source: Ambulatory Visit | Attending: Urology | Admitting: Urology

## 2020-05-04 ENCOUNTER — Other Ambulatory Visit (HOSPITAL_COMMUNITY): Payer: 59

## 2020-05-04 ENCOUNTER — Other Ambulatory Visit: Payer: Self-pay

## 2020-05-04 ENCOUNTER — Encounter (HOSPITAL_COMMUNITY): Payer: Self-pay

## 2020-05-04 DIAGNOSIS — C61 Malignant neoplasm of prostate: Secondary | ICD-10-CM | POA: Insufficient documentation

## 2020-05-04 MED ORDER — TECHNETIUM TC 99M MEDRONATE IV KIT
21.2000 | PACK | Freq: Once | INTRAVENOUS | Status: AC | PRN
  Administered 2020-05-04: 21.2 via INTRAVENOUS

## 2020-05-05 ENCOUNTER — Other Ambulatory Visit (HOSPITAL_COMMUNITY): Payer: 59

## 2020-05-05 ENCOUNTER — Inpatient Hospital Stay (HOSPITAL_COMMUNITY): Admission: RE | Admit: 2020-05-05 | Payer: 59 | Source: Ambulatory Visit

## 2020-05-07 ENCOUNTER — Ambulatory Visit (INDEPENDENT_AMBULATORY_CARE_PROVIDER_SITE_OTHER): Payer: 59

## 2020-05-07 DIAGNOSIS — I469 Cardiac arrest, cause unspecified: Secondary | ICD-10-CM | POA: Diagnosis not present

## 2020-05-07 DIAGNOSIS — I4901 Ventricular fibrillation: Secondary | ICD-10-CM

## 2020-05-10 ENCOUNTER — Telehealth: Payer: Self-pay

## 2020-05-10 LAB — CUP PACEART REMOTE DEVICE CHECK
Battery Remaining Percentage: 9 %
Date Time Interrogation Session: 20220304083100
Implantable Lead Implant Date: 20170727
Implantable Lead Location: 753862
Implantable Lead Model: 3401
Implantable Pulse Generator Implant Date: 20170727
Pulse Gen Serial Number: 203970

## 2020-05-10 NOTE — Telephone Encounter (Signed)
Schedule remote S-ICD report received- Battery- 9% remaining to ERI.   Spoke with pt who noted it was >50% at last check 11/07/19.  Message sent to device rep to review,   Pt device is part of accelerated depletion.  Pt will need to be scheduled for changeout.  Rep finding out from tech services how long exactly we will have to get device changed.

## 2020-05-11 NOTE — Telephone Encounter (Signed)
Message received from [employer] rep, based on current battery status device needs to be changed out within 62 days.     Spoke with pt to provide update, pt would like to meet with Dr. Lovena Le prior to procedure.    Sending message to both MD and scheduling.

## 2020-05-12 NOTE — Telephone Encounter (Signed)
F/u scheduled with GT for 05/18/20

## 2020-05-18 ENCOUNTER — Encounter: Payer: Self-pay | Admitting: Internal Medicine

## 2020-05-18 ENCOUNTER — Ambulatory Visit: Payer: 59 | Admitting: Internal Medicine

## 2020-05-18 ENCOUNTER — Other Ambulatory Visit: Payer: Self-pay

## 2020-05-18 ENCOUNTER — Telehealth: Payer: Self-pay | Admitting: Interventional Cardiology

## 2020-05-18 VITALS — BP 96/52 | HR 61 | Ht 66.0 in | Wt 159.2 lb

## 2020-05-18 DIAGNOSIS — Z95810 Presence of automatic (implantable) cardiac defibrillator: Secondary | ICD-10-CM

## 2020-05-18 DIAGNOSIS — I4901 Ventricular fibrillation: Secondary | ICD-10-CM

## 2020-05-18 DIAGNOSIS — I429 Cardiomyopathy, unspecified: Secondary | ICD-10-CM | POA: Diagnosis not present

## 2020-05-18 DIAGNOSIS — I469 Cardiac arrest, cause unspecified: Secondary | ICD-10-CM | POA: Diagnosis not present

## 2020-05-18 LAB — CBC WITH DIFFERENTIAL/PLATELET
Basophils Absolute: 0 10*3/uL (ref 0.0–0.2)
Basos: 1 %
EOS (ABSOLUTE): 0.2 10*3/uL (ref 0.0–0.4)
Eos: 3 %
Hematocrit: 40.6 % (ref 37.5–51.0)
Hemoglobin: 13.9 g/dL (ref 13.0–17.7)
Immature Grans (Abs): 0 10*3/uL (ref 0.0–0.1)
Immature Granulocytes: 0 %
Lymphocytes Absolute: 1.1 10*3/uL (ref 0.7–3.1)
Lymphs: 20 %
MCH: 33.9 pg — ABNORMAL HIGH (ref 26.6–33.0)
MCHC: 34.2 g/dL (ref 31.5–35.7)
MCV: 99 fL — ABNORMAL HIGH (ref 79–97)
Monocytes Absolute: 0.5 10*3/uL (ref 0.1–0.9)
Monocytes: 8 %
Neutrophils Absolute: 3.8 10*3/uL (ref 1.4–7.0)
Neutrophils: 68 %
Platelets: 250 10*3/uL (ref 150–450)
RBC: 4.1 x10E6/uL — ABNORMAL LOW (ref 4.14–5.80)
RDW: 12.4 % (ref 11.6–15.4)
WBC: 5.6 10*3/uL (ref 3.4–10.8)

## 2020-05-18 LAB — BASIC METABOLIC PANEL
BUN/Creatinine Ratio: 24 (ref 10–24)
BUN: 31 mg/dL — ABNORMAL HIGH (ref 8–27)
CO2: 21 mmol/L (ref 20–29)
Calcium: 9.7 mg/dL (ref 8.6–10.2)
Chloride: 100 mmol/L (ref 96–106)
Creatinine, Ser: 1.31 mg/dL — ABNORMAL HIGH (ref 0.76–1.27)
Glucose: 104 mg/dL — ABNORMAL HIGH (ref 65–99)
Potassium: 4.4 mmol/L (ref 3.5–5.2)
Sodium: 140 mmol/L (ref 134–144)
eGFR: 60 mL/min/{1.73_m2} (ref >59)

## 2020-05-18 NOTE — Telephone Encounter (Signed)
Patient calling back to speak with Anderson Malta. He states he needs to reschedule his appointment with Dr. Tamala Julian due to a procedure being scheduled the same day. I offered the first available with Dr. Tamala Julian in July and he states he needs something sooner.

## 2020-05-18 NOTE — Telephone Encounter (Signed)
Spoke with pt and made him aware ok to hold Brilinta as instructed by Sonia Baller, RN today.  Reviewed the Brilinta instructions on his paperwork today.  Advised the reason why they have to hold the [place].  Pt verbalized understanding and was appreciative for call.

## 2020-05-18 NOTE — H&P (View-Only) (Signed)
HPI [name] returns today after his S-ICD has reached ERI. He is a pleasant 70 yo man with an ICM, s/p MI with persistent LV dysfunction and EF 25%. He recently got Covid but has had a nice recovery. He has not received any ICD therapies. His device is now at ERI less than 5 years since implant.   Allergies  Allergen Reactions  . Ezetimibe Other (See Comments)  . Statins Other (See Comments)    Myalgia  . Penicillins Rash    Occurred as a child - tolerated Zosyn June 2016      Current Outpatient Medications  Medication Sig Dispense Refill  . allopurinol (ZYLOPRIM) 300 MG tablet TAKE 1 TABLET BY MOUTH EVERY DAY 90 tablet 0  . Aspirin Buf,CaCarb-MgCarb-MgO, 81 MG TABS Take 81 mg by mouth daily.    . B Complex-C (B-COMPLEX WITH VITAMIN C) tablet Take 1 tablet by mouth daily.    Marland Kitchen BRILINTA 60 MG TABS tablet TAKE 1 TABLET BY MOUTH EVERY 12 HOURS 180 tablet 3  . carvedilol (COREG) 6.25 MG tablet Take 1 tablet (6.25 mg total) by mouth 2 (two) times daily with a meal. 180 tablet 3  . Cholecalciferol (VITAMIN D-3) 25 MCG (1000 UT) CAPS Take by mouth daily.     . Colchicine 0.6 MG CAPS Take 0.6 mg by mouth as needed.    . Evolocumab (REPATHA SURECLICK) 106 MG/ML SOAJ Inject 140 mg into the skin every 14 (fourteen) days. 6 pen 3  . ferrous sulfate 325 (65 FE) MG tablet Take 325 mg by mouth every other day.     Marland Kitchen MILK THISTLE PO Take 525 mg by mouth every other day.     . nitroGLYCERIN (NITROSTAT) 0.4 MG SL tablet Place 1 tablet (0.4 mg total) under the tongue every 5 (five) minutes as needed for chest pain. 25 tablet 6  . sacubitril-valsartan (ENTRESTO) 49-51 MG Take 1 tablet by mouth 2 (two) times daily. 180 tablet 3  . spironolactone (ALDACTONE) 25 MG tablet Take 12.5 mg by mouth daily.     No current facility-administered medications for this visit.     Past Medical History:  Diagnosis Date  . Actinic keratosis   . Alkaline phosphatase raised   . Anemia   . Cardiac arrhythmia    . Cardiomyopathy ([place])   . CHF (congestive heart failure) ([place])   . Coronary arteriosclerosis   . ED (erectile dysfunction)   . Gout 03/09/2017  . Hemangioma 08/24/2018  . Hyperlipidemia   . Hypertension   . Hypertrophic scar   . Malignant neoplasm of prostate ([place]) 2013   Was told that less than 1% of cancer  . Melanocytic nevi of trunk   . Melanocytic nevus of skin   . Senile hyperkeratosis   . STEMI (ST elevation myocardial infarction) ([place]) 2016    ROS:   All systems reviewed and negative except as noted in the HPI.   Past Surgical History:  Procedure Laterality Date  . ICD IMPLANT    . PROSTATECTOMY  2013     Family History  Problem Relation Age of Onset  . Cancer Father        gastric  . Hypertension Father   . High Cholesterol Father   . Yves Dill Parkinson White syndrome Son   . Migraines Son      Social History   Socioeconomic History  . Marital status: Married    Spouse name: Not on file  . Number of  children: Not on file  . Years of education: Not on file  . Highest education level: Not on file  Occupational History  . Not on file  Tobacco Use  . Smoking status: Never Smoker  . Smokeless tobacco: Never Used  Substance and Sexual Activity  . Alcohol use: Yes    Comment: 2 glasses of wine per week  . Drug use: Not on file  . Sexual activity: Not on file  Other Topics Concern  . Not on file  Social History Narrative   Moved to [place] in Oct 2020 to be closer to grandchildren   Retired IT sales professional, Clinical biochemist, Training and development officer   Married   3 sons   Social Determinants of Radio broadcast assistant Strain: Not on [employer] Insecurity: Not on file  Transportation Needs: Not on file  Physical Activity: Not on file  Stress: Not on file  Social Connections: Not on file  Intimate Partner Violence: Not on file     BP (!) 96/52   Pulse 61   Ht 5\' 6"  (1.676 m)   Wt 159 lb 3.2 oz (72.2 kg)   SpO2 98%   BMI 25.70 kg/m   Physical  Exam:  Well appearing NAD HEENT: Unremarkable Neck:  No JVD, no thyromegally Lymphatics:  No adenopathy Back:  No CVA tenderness Lungs:  Clear with no wheezes HEART:  Regular rate rhythm, no murmurs, no rubs, no clicks Abd:  soft, positive bowel sounds, no organomegally, no rebound, no guarding Ext:  2 plus pulses, no edema, no cyanosis, no clubbing Skin:  No rashes no nodules Neuro:  CN II through XII intact, motor grossly intact  EKG - NSR with Anterior MI  DEVICE  Normal device function.  See PaceArt for details. ERI  Assess/Plan: 1. S-ICD - his device is at [facility] and he will be scheduled for ICD gen change out. 2. ICM - he denies anginal symptoms. No change in meds. 3. Chronic systolilc heart failure - he is on maximal medical therapy. He did have an isolated issue of symptomatic hypotension which has resolved. Continue current meds for now. 4. Prostate CA - he is under treatment by Dr. Jeffie Pollock.   Carleene Overlie Tysen Roesler,MD

## 2020-05-18 NOTE — Progress Notes (Signed)
Remote ICD transmission.   

## 2020-05-18 NOTE — Telephone Encounter (Signed)
Pt c/o medication issue:  1. Name of Medication: BRILINTA 60 MG TABS tablet  2. How are you currently taking this medication (dosage and times per day)? 1 tablet every 12 hrs  3. Are you having a reaction (difficulty breathing--STAT)? no  4. What is your medication issue? Patient wanted to check with Dr. Tamala Julian that it is OK he stops this medication 4 days prior to his gen change out 06/07/20.

## 2020-05-18 NOTE — Telephone Encounter (Signed)
Spoke with pt and moved appt to 4/15 with Dr. Tamala Julian.

## 2020-05-18 NOTE — Progress Notes (Signed)
HPI [name] returns today after his S-ICD has reached ERI. He is a pleasant 70 yo man with an ICM, s/p MI with persistent LV dysfunction and EF 25%. He recently got Covid but has had a nice recovery. He has not received any ICD therapies. His device is now at ERI less than 5 years since implant.   Allergies  Allergen Reactions  . Ezetimibe Other (See Comments)  . Statins Other (See Comments)    Myalgia  . Penicillins Rash    Occurred as a child - tolerated Zosyn June 2016      Current Outpatient Medications  Medication Sig Dispense Refill  . allopurinol (ZYLOPRIM) 300 MG tablet TAKE 1 TABLET BY MOUTH EVERY DAY 90 tablet 0  . Aspirin Buf,CaCarb-MgCarb-MgO, 81 MG TABS Take 81 mg by mouth daily.    . B Complex-C (B-COMPLEX WITH VITAMIN C) tablet Take 1 tablet by mouth daily.    Marland Kitchen BRILINTA 60 MG TABS tablet TAKE 1 TABLET BY MOUTH EVERY 12 HOURS 180 tablet 3  . carvedilol (COREG) 6.25 MG tablet Take 1 tablet (6.25 mg total) by mouth 2 (two) times daily with a meal. 180 tablet 3  . Cholecalciferol (VITAMIN D-3) 25 MCG (1000 UT) CAPS Take by mouth daily.     . Colchicine 0.6 MG CAPS Take 0.6 mg by mouth as needed.    . Evolocumab (REPATHA SURECLICK) 462 MG/ML SOAJ Inject 140 mg into the skin every 14 (fourteen) days. 6 pen 3  . ferrous sulfate 325 (65 FE) MG tablet Take 325 mg by mouth every other day.     Marland Kitchen MILK THISTLE PO Take 525 mg by mouth every other day.     . nitroGLYCERIN (NITROSTAT) 0.4 MG SL tablet Place 1 tablet (0.4 mg total) under the tongue every 5 (five) minutes as needed for chest pain. 25 tablet 6  . sacubitril-valsartan (ENTRESTO) 49-51 MG Take 1 tablet by mouth 2 (two) times daily. 180 tablet 3  . spironolactone (ALDACTONE) 25 MG tablet Take 12.5 mg by mouth daily.     No current facility-administered medications for this visit.     Past Medical History:  Diagnosis Date  . Actinic keratosis   . Alkaline phosphatase raised   . Anemia   . Cardiac arrhythmia    . Cardiomyopathy ([place])   . CHF (congestive heart failure) ([place])   . Coronary arteriosclerosis   . ED (erectile dysfunction)   . Gout 03/09/2017  . Hemangioma 08/24/2018  . Hyperlipidemia   . Hypertension   . Hypertrophic scar   . Malignant neoplasm of prostate ([place]) 2013   Was told that less than 1% of cancer  . Melanocytic nevi of trunk   . Melanocytic nevus of skin   . Senile hyperkeratosis   . STEMI (ST elevation myocardial infarction) ([place]) 2016    ROS:   All systems reviewed and negative except as noted in the HPI.   Past Surgical History:  Procedure Laterality Date  . ICD IMPLANT    . PROSTATECTOMY  2013     Family History  Problem Relation Age of Onset  . Cancer Father        gastric  . Hypertension Father   . High Cholesterol Father   . Kurt Reilly Parkinson White syndrome Son   . Migraines Son      Social History   Socioeconomic History  . Marital status: Married    Spouse name: Not on file  . Number of  children: Not on file  . Years of education: Not on file  . Highest education level: Not on file  Occupational History  . Not on file  Tobacco Use  . Smoking status: Never Smoker  . Smokeless tobacco: Never Used  Substance and Sexual Activity  . Alcohol use: Yes    Comment: 2 glasses of wine per week  . Drug use: Not on file  . Sexual activity: Not on file  Other Topics Concern  . Not on file  Social History Narrative   Moved to [place] in Oct 2020 to be closer to grandchildren   Retired IT sales professional, Clinical biochemist, Training and development officer   Married   3 sons   Social Determinants of Radio broadcast assistant Strain: Not on [employer] Insecurity: Not on file  Transportation Needs: Not on file  Physical Activity: Not on file  Stress: Not on file  Social Connections: Not on file  Intimate Partner Violence: Not on file     BP (!) 96/52   Pulse 61   Ht 5\' 6"  (1.676 m)   Wt 159 lb 3.2 oz (72.2 kg)   SpO2 98%   BMI 25.70 kg/m   Physical  Exam:  Well appearing NAD HEENT: Unremarkable Neck:  No JVD, no thyromegally Lymphatics:  No adenopathy Back:  No CVA tenderness Lungs:  Clear with no wheezes HEART:  Regular rate rhythm, no murmurs, no rubs, no clicks Abd:  soft, positive bowel sounds, no organomegally, no rebound, no guarding Ext:  2 plus pulses, no edema, no cyanosis, no clubbing Skin:  No rashes no nodules Neuro:  CN II through XII intact, motor grossly intact  EKG - NSR with Anterior MI  DEVICE  Normal device function.  See PaceArt for details. ERI  Assess/Plan: 1. S-ICD - his device is at [name] and he will be scheduled for ICD gen change out. 2. ICM - he denies anginal symptoms. No change in meds. 3. Chronic systolilc heart failure - he is on maximal medical therapy. He did have an isolated issue of symptomatic hypotension which has resolved. Continue current meds for now. 4. Prostate CA - he is under treatment by Dr. Jeffie Reilly.   Kurt Overlie Taylor,MD

## 2020-05-18 NOTE — Patient Instructions (Signed)
Medication Instructions:  Your physician recommends that you continue on your current medications as directed. Please refer to the Current Medication list given to you today.  Labwork: You will get lab work today:  BMP and CBC  Testing/Procedures: None ordered.  Follow-Up:  SEE INSTRUCTION LETTER  Any Other Special Instructions Will Be Listed Below (If Applicable).  If you need a refill on your cardiac medications before your next appointment, please call your pharmacy.   

## 2020-05-25 ENCOUNTER — Encounter: Payer: Self-pay | Admitting: Physician Assistant

## 2020-06-01 ENCOUNTER — Other Ambulatory Visit: Payer: Self-pay | Admitting: Interventional Cardiology

## 2020-06-01 DIAGNOSIS — I2510 Atherosclerotic heart disease of native coronary artery without angina pectoris: Secondary | ICD-10-CM

## 2020-06-06 ENCOUNTER — Other Ambulatory Visit: Payer: Self-pay | Admitting: Interventional Cardiology

## 2020-06-07 ENCOUNTER — Encounter (HOSPITAL_COMMUNITY)
Admission: RE | Disposition: A | Discharge status: Home / Self Care | Payer: Self-pay | Source: Home / Self Care | Attending: Internal Medicine

## 2020-06-07 ENCOUNTER — Other Ambulatory Visit: Payer: Self-pay

## 2020-06-07 ENCOUNTER — Ambulatory Visit: Payer: 59 | Admitting: Interventional Cardiology

## 2020-06-07 ENCOUNTER — Ambulatory Visit (HOSPITAL_COMMUNITY)
Admission: RE | Admit: 2020-06-07 | Discharge: 2020-06-07 | Disposition: A | Discharge status: Home / Self Care | Payer: 59 | Attending: Internal Medicine | Admitting: Internal Medicine

## 2020-06-07 DIAGNOSIS — C61 Malignant neoplasm of prostate: Secondary | ICD-10-CM | POA: Diagnosis not present

## 2020-06-07 DIAGNOSIS — I255 Ischemic cardiomyopathy: Secondary | ICD-10-CM | POA: Insufficient documentation

## 2020-06-07 DIAGNOSIS — I110 Hypertensive heart disease with heart failure: Secondary | ICD-10-CM | POA: Diagnosis not present

## 2020-06-07 DIAGNOSIS — Z7982 Long term (current) use of aspirin: Secondary | ICD-10-CM | POA: Insufficient documentation

## 2020-06-07 DIAGNOSIS — Z79899 Other long term (current) drug therapy: Secondary | ICD-10-CM | POA: Insufficient documentation

## 2020-06-07 DIAGNOSIS — Z888 Allergy status to other drugs, medicaments and biological substances status: Secondary | ICD-10-CM | POA: Insufficient documentation

## 2020-06-07 DIAGNOSIS — I5022 Chronic systolic (congestive) heart failure: Secondary | ICD-10-CM | POA: Insufficient documentation

## 2020-06-07 DIAGNOSIS — Z8616 Personal history of COVID-19: Secondary | ICD-10-CM | POA: Diagnosis not present

## 2020-06-07 DIAGNOSIS — Z4502 Encounter for adjustment and management of automatic implantable cardiac defibrillator: Secondary | ICD-10-CM | POA: Insufficient documentation

## 2020-06-07 DIAGNOSIS — Z880 Allergy status to penicillin: Secondary | ICD-10-CM | POA: Insufficient documentation

## 2020-06-07 HISTORY — PX: SUBQ ICD CHANGEOUT: EP1235

## 2020-06-07 SURGERY — SUBQ ICD CHANGEOUT

## 2020-06-07 MED ORDER — LIDOCAINE HCL (PF) 1 % IJ SOLN
INTRAMUSCULAR | Status: AC
  Filled 2020-06-07: qty 30

## 2020-06-07 MED ORDER — SODIUM CHLORIDE 0.9 % IV SOLN: INTRAVENOUS | Status: DC

## 2020-06-07 MED ORDER — SODIUM CHLORIDE 0.9 % IV SOLN
INTRAVENOUS | Status: AC
  Filled 2020-06-07: qty 2

## 2020-06-07 MED ORDER — POVIDONE-IODINE 10 % EX SWAB
2.0000 "application " | Freq: Once | CUTANEOUS | Status: AC
  Administered 2020-06-07: 2 via TOPICAL

## 2020-06-07 MED ORDER — MIDAZOLAM HCL 5 MG/5ML IJ SOLN
INTRAMUSCULAR | Status: AC
  Filled 2020-06-07: qty 5

## 2020-06-07 MED ORDER — FENTANYL CITRATE (PF) 100 MCG/2ML IJ SOLN
INTRAMUSCULAR | Status: DC | PRN
  Administered 2020-06-07: 12.5 ug via INTRAVENOUS
  Administered 2020-06-07 (×3): 25 ug via INTRAVENOUS

## 2020-06-07 MED ORDER — SODIUM CHLORIDE 0.9 % IV SOLN
80.0000 mg | INTRAVENOUS | Status: AC
  Administered 2020-06-07: 80 mg

## 2020-06-07 MED ORDER — MIDAZOLAM HCL 5 MG/5ML IJ SOLN
INTRAMUSCULAR | Status: DC | PRN
  Administered 2020-06-07 (×3): 1 mg via INTRAVENOUS
  Administered 2020-06-07 (×2): 2 mg via INTRAVENOUS

## 2020-06-07 MED ORDER — VANCOMYCIN HCL IN DEXTROSE 1-5 GM/200ML-% IV SOLN
INTRAVENOUS | Status: AC
  Filled 2020-06-07: qty 200

## 2020-06-07 MED ORDER — LIDOCAINE HCL (PF) 1 % IJ SOLN
INTRAMUSCULAR | Status: DC | PRN
  Administered 2020-06-07: 60 mL

## 2020-06-07 MED ORDER — ONDANSETRON HCL 4 MG/2ML IJ SOLN: 4.0000 mg | Freq: Four times a day (QID) | INTRAMUSCULAR | Status: DC | PRN

## 2020-06-07 MED ORDER — VANCOMYCIN HCL 1000 MG/200ML IV SOLN
1000.0000 mg | INTRAVENOUS | Status: AC
  Administered 2020-06-07: 1000 mg via INTRAVENOUS
  Filled 2020-06-07: qty 200

## 2020-06-07 MED ORDER — FENTANYL CITRATE (PF) 100 MCG/2ML IJ SOLN
INTRAMUSCULAR | Status: AC
  Filled 2020-06-07: qty 2

## 2020-06-07 MED ORDER — ACETAMINOPHEN 325 MG PO TABS
325.0000 mg | ORAL_TABLET | ORAL | Status: DC | PRN
  Administered 2020-06-07: 650 mg via ORAL
  Filled 2020-06-07: qty 2

## 2020-06-07 SURGICAL SUPPLY — 4 items
CABLE SURGICAL S-101-97-12 (CABLE) ×2 IMPLANT
ICD SUBCU MRI EMBLEM A219 (ICD Generator) ×2 IMPLANT
PAD PRO RADIOLUCENT 2001M-C (PAD) ×4 IMPLANT
TRAY PACEMAKER INSERTION (PACKS) ×2 IMPLANT

## 2020-06-07 NOTE — Interval H&P Note (Signed)
History and Physical Interval Note:  06/07/2020 11:09 AM  [name]  has presented today for surgery, with the diagnosis of eri.  The various methods of treatment have been discussed with the patient and family. After consideration of risks, benefits and other options for treatment, the patient has consented to  Procedure(s): SUBQ ICD CHANGEOUT (N/A) as a surgical intervention.  The patient's history has been reviewed, patient examined, no change in status, stable for surgery.  I have reviewed the patient's chart and labs.  Questions were answered to the patient's satisfaction.     Kurt Reilly

## 2020-06-07 NOTE — Discharge Instructions (Signed)

## 2020-06-08 ENCOUNTER — Encounter (HOSPITAL_COMMUNITY): Payer: Self-pay | Admitting: Internal Medicine

## 2020-06-08 NOTE — Progress Notes (Signed)
Cardiology Office Note Date:  06/09/2020  Patient ID:  Kurt, Reilly [birth year], MRN [id number] PCP:  Inda Coke, PA  Cardiologist:  Dr. Tamala Julian Electrophysiologist: Dr. Lovena Le    Chief Complaint: wound check   History of Present Illness: Kurt Reilly is a 70 y.o. male with history of CAD, (STEMI associated with cardiac arrest 2016), ICM, chronic CHF (systolic), S-ICD, HTN, HLD, prostate ca  He comes in today to be seen for Dr. Lovena Le, underwent S-ICD gen change 06/07/20, discharged with a pressure dressing and planned for 2 day site check.  TODAY He comes with his wife today S/p S-ICD gen change Monday. Has moderate discomfort, he is using Tylenol, this allows him to sleep OK No CP, palpitations, SOB No dizziness, near syncope or syncope They have not noted any bleeding  He has not taken brilinta, instructed not to until Sat     Device information BSCi S-ICD implanted 09/30/2015 >> gen change 06/07/20 2/2 early depletion Hx of cardiac arrest  Past Medical History:  Diagnosis Date  . Actinic keratosis   . Alkaline phosphatase raised   . Anemia   . Cardiac arrhythmia   . Cardiomyopathy ([place])   . CHF (congestive heart failure) ([place])   . Coronary arteriosclerosis   . ED (erectile dysfunction)   . Gout 03/09/2017  . Hemangioma 08/24/2018  . Hyperlipidemia   . Hypertension   . Hypertrophic scar   . Malignant neoplasm of prostate ([place]) 2013   Was told that less than 1% of cancer  . Melanocytic nevi of trunk   . Melanocytic nevus of skin   . Senile hyperkeratosis   . STEMI (ST elevation myocardial infarction) ([place]) 2016    Past Surgical History:  Procedure Laterality Date  . ICD IMPLANT    . PROSTATECTOMY  2013  . SUBQ ICD CHANGEOUT N/A 06/07/2020   Procedure: [place];  Surgeon: Evans Lance, MD;  Location: [facility];  Service: Cardiovascular;  Laterality: N/A;    Current Outpatient Medications  Medication Sig Dispense Refill  .  allopurinol (ZYLOPRIM) 300 MG tablet TAKE 1 TABLET BY MOUTH EVERY DAY 90 tablet 0  . aspirin EC 81 MG tablet Take 81 mg by mouth daily. Swallow whole.    . B Complex-C-Folic Acid (STRESS 038 B-COMPLEX PO) Take 1 tablet by mouth daily.    Marland Kitchen BLACK ELDERBERRY PO Take 1 each by mouth every other day.    . carvedilol (COREG) 6.25 MG tablet Take 1 tablet (6.25 mg total) by mouth 2 (two) times daily with a meal. 180 tablet 3  . Cholecalciferol (VITAMIN D-3) 25 MCG (1000 UT) CAPS Take 1,000 Units by mouth daily.    . Colchicine 0.6 MG CAPS Take 0.6 mg by mouth daily as needed (gout).    . ferrous sulfate 325 (65 FE) MG tablet Take 325 mg by mouth daily with breakfast.    . fluticasone (FLONASE) 50 MCG/ACT nasal spray Place 1 spray into both nostrils daily as needed for allergies or rhinitis.    Marland Kitchen MILK THISTLE PO Take 525 mg by mouth every other day.     . nitroGLYCERIN (NITROSTAT) 0.4 MG SL tablet Place 1 tablet (0.4 mg total) under the tongue every 5 (five) minutes as needed for chest pain. 25 tablet 6  . REPATHA SURECLICK 882 MG/ML SOAJ INJECT 140MG  SUBCUTANEOUSLY EVERY 2 WEEKS 6 mL 3  . sacubitril-valsartan (ENTRESTO) 49-51 MG Take 1 tablet by mouth 2 (two) times daily. 180 tablet 3  .  spironolactone (ALDACTONE) 25 MG tablet Take 12.5 mg by mouth daily.    . ticagrelor (BRILINTA) 60 MG TABS tablet Take 1 tablet (60 mg total) by mouth 2 (two) times daily. 60 tablet 11   No current facility-administered medications for this visit.    Allergies:   Ezetimibe, Statins, and Penicillins   Social History:  The patient  reports that he has never smoked. He has never used smokeless tobacco. He reports current alcohol use.   Family History:  The patient's family history includes Cancer in his father; High Cholesterol in his father; Hypertension in his father; Migraines in his son; Yves Dill Parkinson White syndrome in his son.  ROS:  Please see the history of present illness.    All other systems are reviewed  and otherwise negative.   PHYSICAL EXAM:  VS:  BP 118/72   Pulse 68   Ht 5\' 6"  (1.676 m)   Wt 159 lb 3.2 oz (72.2 kg)   SpO2 98%   BMI 25.70 kg/m  BMI: Body mass index is 25.7 kg/m. Well nourished, well developed, in no acute distress HEENT: normocephalic, atraumatic Neck: no JVD, carotid bruits or masses Cardiac:  RRR; no significant murmurs, no rubs, or gallops Lungs:  CTA b/l, no wheezing, rhonchi or rales Abd: soft, nontender MS: no deformity or atrophy Ext: no edema Skin: warm and dry, no rash Neuro:  No gross deficits appreciated Psych: euthymic mood, full affect  ICD site: pressure dressing was semi intact and removed. Steri strips remain in place and are dry He has skin irritation from dressing tape, no erythema otherwise. Site has some swelling, it is soft, some tenderness to palpation.  Not a firm hematoma     EKG:  Not done tody  Device interrogation done today and reviewed by myself:  Battery 100% Electrode impedance is 80 Ohms No arrhythmias or therapies System optimization completed with industry rep guidance via telephone   04/21/2020: TTE IMPRESSIONS  1. Left ventricular ejection fraction, by estimation, is 25 to 30%. The  left ventricle has severely decreased function. The left ventricle  demonstrates regional wall motion abnormalities (see scoring  diagram/findings for description). The left  ventricular internal cavity size was mildly dilated. Left ventricular  diastolic parameters are indeterminate.  2. Right ventricular systolic function is normal. The right ventricular  size is normal. There is normal pulmonary artery systolic pressure.  3. Left atrial size was mild to moderately dilated.  4. The mitral valve is normal in structure. Trivial mitral valve  regurgitation. No evidence of mitral stenosis.  5. The aortic valve is tricuspid. There is mild calcification of the  aortic valve. Aortic valve regurgitation is not visualized. Mild  aortic  valve sclerosis is present, with no evidence of aortic valve stenosis.  6. The inferior vena cava is normal in size with greater than 50%  respiratory variability, suggesting right atrial pressure of 3 mmHg.   Comparison(s): No significant change from prior study.     07/14/2019: stress myoview  Nuclear stress EF: 25%. The left ventricular ejection fraction is severely decreased (<30%).  There was no ST segment deviation noted during stress.  Defect 1: There is a large defect of severe severity present in the basal inferoseptal, basal inferior, mid inferoseptal, mid inferior, apical anterior, apical septal, apical inferior, apical lateral and apex location.  Findings consistent with prior anterioapical and inferior wall myocardial infarction.  There is no evidence of ischemia    Recent Labs: 07/24/2019: ALT 22 05/18/2020: BUN  31; Creatinine, Ser 1.31; Hemoglobin 13.9; Platelets 250; Potassium 4.4; Sodium 140  07/24/2019: Chol/HDL Ratio 3.4; Cholesterol, Total 133; HDL 39; LDL Chol Calc (NIH) 62; Triglycerides 195   CrCl cannot be calculated (Patient's most recent lab result is older than the maximum 21 days allowed.).   Wt Readings from Last 3 Encounters:  06/09/20 159 lb 3.2 oz (72.2 kg)  06/07/20 155 lb (70.3 kg)  05/18/20 159 lb 3.2 oz (72.2 kg)     Other studies reviewed: Additional studies/records reviewed today include: summarized above  ASSESSMENT AND PLAN:  1. S-ICD     intact function, no programming changes made  Has swelling at the sit, not a firm hematoma, no bleeding or signs of infection In d/w Dr. Lovena Le, no new pressure dressing, no Brilinta until seen at his wound check ion the 14th He has not had any vascular interventions in the last 12 months (longer) Advised not to shower, get site wet until cleared toa t his next wound check visit  2. CAD     No symptoms of angina     On BB, ASA, brilinta (held as above), repatha  3. ICM 4. Chronic CHF  (systolic)     No exam findings or symptoms of volume OL     On BB, entresto, aldactone  5. HTN     Looks ok  6. HLD     Not addressed today   Disposition: F/u with woyund check and Dr. Lovena Le as scheduled, sooner if needed  Current medicines are reviewed at length with the patient today.  The patient did not have any concerns regarding medicines.  Venetia Night, PA-C 06/09/2020 5:29 PM     [place] [address] [place] [address] [phone] (office)  [phone] (fax)

## 2020-06-09 ENCOUNTER — Ambulatory Visit: Payer: 59 | Admitting: Physician Assistant

## 2020-06-09 ENCOUNTER — Encounter: Payer: 59 | Admitting: Internal Medicine

## 2020-06-09 ENCOUNTER — Encounter: Payer: Self-pay | Admitting: Physician Assistant

## 2020-06-09 ENCOUNTER — Other Ambulatory Visit: Payer: Self-pay

## 2020-06-09 VITALS — BP 118/72 | HR 68 | Ht 66.0 in | Wt 159.2 lb

## 2020-06-09 DIAGNOSIS — I5021 Acute systolic (congestive) heart failure: Secondary | ICD-10-CM | POA: Diagnosis not present

## 2020-06-09 DIAGNOSIS — Z95810 Presence of automatic (implantable) cardiac defibrillator: Secondary | ICD-10-CM

## 2020-06-09 DIAGNOSIS — I5022 Chronic systolic (congestive) heart failure: Secondary | ICD-10-CM | POA: Diagnosis not present

## 2020-06-09 DIAGNOSIS — Z5189 Encounter for other specified aftercare: Secondary | ICD-10-CM

## 2020-06-09 DIAGNOSIS — I2510 Atherosclerotic heart disease of native coronary artery without angina pectoris: Secondary | ICD-10-CM

## 2020-06-09 DIAGNOSIS — I255 Ischemic cardiomyopathy: Secondary | ICD-10-CM

## 2020-06-09 DIAGNOSIS — I429 Cardiomyopathy, unspecified: Secondary | ICD-10-CM

## 2020-06-09 NOTE — Patient Instructions (Addendum)
Medication Instructions:   PLEASE MAKE SURE YOU HOLD BRILINTA UNTIL NEXT  WOUND CHECK VISIT   *If you need a refill on your cardiac medications before your next appointment, please call your pharmacy*   Lab Work: NONE ORDERED  TODAY  If you have labs (blood work) drawn today and your tests are completely normal, you will receive your results only by: Marland Kitchen MyChart Message (if you have MyChart) OR . A paper copy in the mail If you have any lab test that is abnormal or we need to change your treatment, we will call you to review the results.   Testing/Procedures: NONE ORDERED  TODAY    Follow-Up: At [facility], you and your health needs are our priority.  As part of our continuing mission to provide you with exceptional heart care, we have created designated Provider Care Teams.  These Care Teams include your primary Cardiologist (physician) and Advanced Practice Providers (APPs -  Physician Assistants and Nurse Practitioners) who all work together to provide you with the care you need, when you need it.  We recommend signing up for the patient portal called "MyChart".  Sign up information is provided on this After Visit Summary.  MyChart is used to connect with patients for Virtual Visits (Telemedicine).  Patients are able to view lab/test results, encounter notes, upcoming appointments, etc.  Non-urgent messages can be sent to your provider as well.   To learn more about what you can do with MyChart, go to [email or web address].    Your next appointment:  AS SCHEDULED     Other Instructions

## 2020-06-17 ENCOUNTER — Other Ambulatory Visit: Payer: Self-pay

## 2020-06-17 ENCOUNTER — Ambulatory Visit (INDEPENDENT_AMBULATORY_CARE_PROVIDER_SITE_OTHER): Payer: 59 | Admitting: Emergency Medicine

## 2020-06-17 DIAGNOSIS — I469 Cardiac arrest, cause unspecified: Secondary | ICD-10-CM

## 2020-06-17 DIAGNOSIS — I4901 Ventricular fibrillation: Secondary | ICD-10-CM | POA: Diagnosis not present

## 2020-06-17 LAB — CUP PACEART INCLINIC DEVICE CHECK
Date Time Interrogation Session: 20220414110459
Implantable Lead Implant Date: 20170727
Implantable Lead Location: 753862
Implantable Lead Model: 3401
Implantable Pulse Generator Implant Date: 20220404
Pulse Gen Serial Number: 156707

## 2020-06-17 NOTE — Patient Instructions (Addendum)
Restart Brilinta 60 mg twice a day on  06/24/20. If you have increased swelling at wound site after restrting Brilinta then stop taking the medication and call the office. Call the off if you have any increased swelling , drainage, redness or bleeding from wound site or if you develop a fever or chills.  Device Clinic: [phone]

## 2020-06-17 NOTE — Progress Notes (Signed)
Subcutaneous ICD wound check in clinic. Steri-strips removed, wound edges well approximated, wound area with flucuating edema. Dr Lovena Le in to assess wound site. patient to hold Brilinta until 06/24/20 and stop medication and  call if he has increased swelling.  0 untreated episodes; 0 treated episodes; 0 shocks delivered. Electrode impedance status okay. No programming changes. Remaining longevity to ERI 100%. See scanned in report.

## 2020-06-18 ENCOUNTER — Encounter: Payer: Self-pay | Admitting: Interventional Cardiology

## 2020-06-18 ENCOUNTER — Ambulatory Visit (INDEPENDENT_AMBULATORY_CARE_PROVIDER_SITE_OTHER): Payer: 59 | Admitting: Interventional Cardiology

## 2020-06-18 VITALS — BP 118/70 | HR 68 | Ht 66.0 in | Wt 161.4 lb

## 2020-06-18 DIAGNOSIS — Z95810 Presence of automatic (implantable) cardiac defibrillator: Secondary | ICD-10-CM | POA: Diagnosis not present

## 2020-06-18 DIAGNOSIS — I255 Ischemic cardiomyopathy: Secondary | ICD-10-CM | POA: Diagnosis not present

## 2020-06-18 DIAGNOSIS — I2510 Atherosclerotic heart disease of native coronary artery without angina pectoris: Secondary | ICD-10-CM | POA: Diagnosis not present

## 2020-06-18 DIAGNOSIS — I5022 Chronic systolic (congestive) heart failure: Secondary | ICD-10-CM | POA: Diagnosis not present

## 2020-06-18 DIAGNOSIS — I10 Essential (primary) hypertension: Secondary | ICD-10-CM

## 2020-06-18 DIAGNOSIS — E785 Hyperlipidemia, unspecified: Secondary | ICD-10-CM

## 2020-06-18 MED ORDER — EMPAGLIFLOZIN 10 MG PO TABS: 10.0000 mg | ORAL_TABLET | Freq: Every day | ORAL | 11 refills | Status: DC

## 2020-06-18 MED ORDER — CLOPIDOGREL BISULFATE 75 MG PO TABS: 75.0000 mg | ORAL_TABLET | Freq: Every day | ORAL | 3 refills | Status: DC

## 2020-06-18 NOTE — Progress Notes (Signed)
Cardiology Office Note:    Date:  06/18/2020   ID:  Kurt Reilly, DOB 20-Aug-1950, MRN [id number]  PCP:  Inda Coke, PA  Cardiologist:  Sinclair Grooms, MD   Referring MD: Inda Coke, [place]   Chief Complaint  Patient presents with  . Congestive Heart Failure  . Coronary Artery Disease    History of Present Illness:    Kurt Reilly is a 70 y.o. male with a hx of CAD with anterior MI/cardiac arrest [id number] with 100% LADwith PCI - hadassociated cardiogenic shock/failure to wean - then hadstaged PCI to LCX and ostial LAD in June 2016(total of 2 stents to the LAD) [facility], chronic systolic HF, ICM,underlyingICD (subcutaneous 2017),HLD, HTN and ED. EF was less than 35%-->improved to 40 to 45%--> most recent EF 25% (07/2019)  Since starting Odette Fraction feels great.  He does not have dyspnea on exertion.  He always sleeps on a wedge because he snores.  Says he has been tested for sleep apnea and does not have it.  He is not having angina.  He is having financial concerns related to heart failure therapy cost.  Past Medical History:  Diagnosis Date  . Actinic keratosis   . Alkaline phosphatase raised   . Anemia   . Cardiac arrhythmia   . Cardiomyopathy ([place])   . CHF (congestive heart failure) ([place])   . Coronary arteriosclerosis   . ED (erectile dysfunction)   . Gout 03/09/2017  . Hemangioma 08/24/2018  . Hyperlipidemia   . Hypertension   . Hypertrophic scar   . Malignant neoplasm of prostate ([place]) 2013   Was told that less than 1% of cancer  . Melanocytic nevi of trunk   . Melanocytic nevus of skin   . Senile hyperkeratosis   . STEMI (ST elevation myocardial infarction) ([place]) 2016    Past Surgical History:  Procedure Laterality Date  . ICD IMPLANT    . PROSTATECTOMY  2013  . SUBQ ICD CHANGEOUT N/A 06/07/2020   Procedure: [place];  Surgeon: Evans Lance, MD;  Location: [facility];  Service: Cardiovascular;  Laterality: N/A;     Current Medications: Current Meds  Medication Sig  . allopurinol (ZYLOPRIM) 300 MG tablet TAKE 1 TABLET BY MOUTH EVERY DAY  . aspirin EC 81 MG tablet Take 81 mg by mouth daily. Swallow whole.  . B Complex-C-Folic Acid (STRESS 366 B-COMPLEX PO) Take 1 tablet by mouth daily.  Marland Kitchen BLACK ELDERBERRY PO Take 1 each by mouth every other day.  . carvedilol (COREG) 6.25 MG tablet Take 1 tablet (6.25 mg total) by mouth 2 (two) times daily with a meal.  . Cholecalciferol (VITAMIN D-3) 25 MCG (1000 UT) CAPS Take 1,000 Units by mouth daily.  . Colchicine 0.6 MG CAPS Take 0.6 mg by mouth daily as needed (gout).  . ferrous sulfate 325 (65 FE) MG tablet Take 325 mg by mouth daily with breakfast.  . fluticasone (FLONASE) 50 MCG/ACT nasal spray Place 1 spray into both nostrils daily as needed for allergies or rhinitis.  Marland Kitchen MILK THISTLE PO Take 525 mg by mouth every other day.   . nitroGLYCERIN (NITROSTAT) 0.4 MG SL tablet Place 1 tablet (0.4 mg total) under the tongue every 5 (five) minutes as needed for chest pain.  Marland Kitchen REPATHA SURECLICK 440 MG/ML SOAJ INJECT 140MG SUBCUTANEOUSLY EVERY 2 WEEKS  . sacubitril-valsartan (ENTRESTO) 49-51 MG Take 1 tablet by mouth 2 (two) times daily.  Marland Kitchen spironolactone (ALDACTONE) 25 MG tablet  Take 12.5 mg by mouth daily.  . ticagrelor (BRILINTA) 60 MG TABS tablet Take 1 tablet (60 mg total) by mouth 2 (two) times daily.     Allergies:   Ezetimibe, Statins, and Penicillins   Social History   Socioeconomic History  . Marital status: Married    Spouse name: Not on file  . Number of children: Not on file  . Years of education: Not on file  . Highest education level: Not on file  Occupational History  . Not on file  Tobacco Use  . Smoking status: Never Smoker  . Smokeless tobacco: Never Used  Substance and Sexual Activity  . Alcohol use: Yes    Comment: 2 glasses of wine per week  . Drug use: Not on file  . Sexual activity: Not on file  Other Topics Concern  . Not on  file  Social History Narrative   Moved to [facility] in Oct 2020 to be closer to grandchildren   Retired IT sales professional, Clinical biochemist, Training and development officer   Married   3 sons   Social Determinants of Radio broadcast assistant Strain: Not on [employer] Insecurity: Not on file  Transportation Needs: Not on file  Physical Activity: Not on file  Stress: Not on file  Social Connections: Not on file     Family History: The patient's family history includes Cancer in his father; High Cholesterol in his father; Hypertension in his father; Migraines in his son; Yves Dill Parkinson White syndrome in his son.  ROS:   Please see the history of present illness.    No blood in the urine or stool.  He is off Brilinta after recent subcu ICD placement.  All other systems reviewed and are negative.  EKGs/Labs/Other Studies Reviewed:    The following studies were reviewed today: Echocardiogram February 2022: IMPRESSIONS    1. Left ventricular ejection fraction, by estimation, is 25 to 30%. The  left ventricle has severely decreased function. The left ventricle  demonstrates regional wall motion abnormalities (see scoring  diagram/findings for description). The left  ventricular internal cavity size was mildly dilated. Left ventricular  diastolic parameters are indeterminate.  2. Right ventricular systolic function is normal. The right ventricular  size is normal. There is normal pulmonary artery systolic pressure.  3. Left atrial size was mild to moderately dilated.  4. The mitral valve is normal in structure. Trivial mitral valve  regurgitation. No evidence of mitral stenosis.  5. The aortic valve is tricuspid. There is mild calcification of the  aortic valve. Aortic valve regurgitation is not visualized. Mild aortic  valve sclerosis is present, with no evidence of aortic valve stenosis.  6. The inferior vena cava is normal in size with greater than 50%  respiratory variability, suggesting  right atrial pressure of 3 mmHg.   Comparison(s): No significant change from prior study.   EKG:  EKG not repeated  Recent Labs: 07/24/2019: ALT 22 05/18/2020: BUN 31; Creatinine, Ser 1.31; Hemoglobin 13.9; Platelets 250; Potassium 4.4; Sodium 140  Recent Lipid Panel    Component Value Date/Time   CHOL 133 07/24/2019 0715   TRIG 195 (H) 07/24/2019 0715   HDL 39 (L) 07/24/2019 0715   CHOLHDL 3.4 07/24/2019 0715   LDLCALC 62 07/24/2019 0715    Physical Exam:    VS:  BP 118/70   Pulse 68   Ht 5' 6"  (1.676 m)   Wt 161 lb 6.4 oz (73.2 kg)   SpO2 98%   BMI 26.05 kg/m  Wt Readings from Last 3 Encounters:  06/18/20 161 lb 6.4 oz (73.2 kg)  06/09/20 159 lb 3.2 oz (72.2 kg)  06/07/20 155 lb (70.3 kg)     GEN: Healthy-appearing. No acute distress HEENT: Normal NECK: No JVD. LYMPHATICS: No lymphadenopathy CARDIAC: No murmur. RRR S4 gallop, or edema. VASCULAR:  Normal Pulses. No bruits. RESPIRATORY:  Clear to auscultation without rales, wheezing or rhonchi  ABDOMEN: Soft, non-tender, non-distended, No pulsatile mass, MUSCULOSKELETAL: No deformity  SKIN: Warm and dry NEUROLOGIC:  Alert and oriented x 3 PSYCHIATRIC:  Normal affect   ASSESSMENT:    1. Coronary artery disease involving native coronary artery of native heart without angina pectoris   2. Chronic systolic congestive heart failure ([place])   3. Ischemic cardiomyopathy   4. Cardiac defibrillator in situ   5. Essential hypertension    PLAN:    In order of problems listed above:  1. Secondary prevention discussed.  Notify if chest pain. 2. Needs referral to advanced heart failure team to co-manage. Add [place]. Up-titrate beta blocker as tolerated in the future if he tolerates [place].  Refer to advanced heart failure clinic for evaluation and potentially comanagement if they think it would be in the patient's best interest.  He is actually doing quite well at this time on a very good heart failure  regimen. 3. Continue the 4 pillar approach 4. Followed in device clinic 5. Blood pressure is excellent 6. We will check a lipid panel and c-Met.  Continue Repatha.  Overall education and awareness concerning primary/secondary risk prevention was discussed in detail: LDL less than 70, hemoglobin A1c less than 7, blood pressure target less than 130/80 mmHg, >150 minutes of moderate aerobic activity per week, avoidance of smoking, weight control (via diet and exercise), and continued surveillance/management of/for obstructive sleep apnea.  3-4 month f/u  Medication Adjustments/Labs and Tests Ordered: Current medicines are reviewed at length with the patient today.  Concerns regarding medicines are outlined above.  No orders of the defined types were placed in this encounter.  No orders of the defined types were placed in this encounter.   There are no Patient Instructions on file for this visit.   Signed, Sinclair Grooms, MD  06/18/2020 1:35 PM    [place]

## 2020-06-18 NOTE — Patient Instructions (Signed)
Medication Instructions:  1) START Jardiance 10mg  once daily 2) When you are instructed to restart your antiplatelet medication, we will have you start Plavix 75mg  once daily.    *If you need a refill on your cardiac medications before your next appointment, please call your pharmacy*   Lab Work: Lipid, Liver and BMET in 7-10 days.  You will need to be fasting for these labs (nothing to eat or drink after midnight except water and black coffee).  If you have labs (blood work) drawn today and your tests are completely normal, you will receive your results only by: Marland Kitchen MyChart Message (if you have MyChart) OR . A paper copy in the mail If you have any lab test that is abnormal or we need to change your treatment, we will call you to review the results.   Testing/Procedures: None   Follow-Up: At [facility], you and your health needs are our priority.  As part of our continuing mission to provide you with exceptional heart care, we have created designated Provider Care Teams.  These Care Teams include your primary Cardiologist (physician) and Advanced Practice Providers (APPs -  Physician Assistants and Nurse Practitioners) who all work together to provide you with the care you need, when you need it.  We recommend signing up for the patient portal called "MyChart".  Sign up information is provided on this After Visit Summary.  MyChart is used to connect with patients for Virtual Visits (Telemedicine).  Patients are able to view lab/test results, encounter notes, upcoming appointments, etc.  Non-urgent messages can be sent to your provider as well.   To learn more about what you can do with MyChart, go to [email or web address].    Your next appointment:   3 month(s)  The format for your next appointment:   In Person  Provider:   You may see Sinclair Grooms, MD or one of the following Advanced Practice Providers on your designated Care Team:    Kathyrn Drown, NP    Other  Instructions  You have been referred to our [facility].  You will receive a call from this office to get you scheduled.

## 2020-06-20 ENCOUNTER — Other Ambulatory Visit: Payer: Self-pay | Admitting: Physician Assistant

## 2020-06-20 DIAGNOSIS — M545 Low back pain, unspecified: Secondary | ICD-10-CM

## 2020-06-20 DIAGNOSIS — G8929 Other chronic pain: Secondary | ICD-10-CM

## 2020-06-29 ENCOUNTER — Other Ambulatory Visit: Payer: 59

## 2020-07-06 ENCOUNTER — Other Ambulatory Visit: Payer: Self-pay | Admitting: Internal Medicine

## 2020-07-12 ENCOUNTER — Telehealth: Payer: Self-pay | Admitting: Interventional Cardiology

## 2020-07-12 ENCOUNTER — Encounter: Payer: Self-pay | Admitting: Radiation Oncology

## 2020-07-12 NOTE — Progress Notes (Signed)
GU Location of Tumor / Histology: biochemical recurrent prostatic adenocarcinoma  If Prostate Cancer, Gleason Score is (4 + 3). PSA at diagnosis unknown.   Ayeden Gladman was sent back to Dr. Jeffie Pollock in February 2022 by Inda Coke, PA for rising PSA s/p prostatectomy 10 years ago.    Past/Anticipated interventions by urology, if any: prostate biopsy, prostatectomy, CT scan (negative), Bone scan (negative), PET scan (ordered), referral to Dr. Tammi Klippel for consideration of salvage radiation therapy.  Past/Anticipated interventions by medical oncology, if any: no  Weight changes, if any: no  Bowel/Bladder complaints, if any: IPSS 0. SHIM 13. Denies dysuria, hematuria, urinary leakage or incontinence. Denies any bowel complaints.   Nausea/Vomiting, if any: no  Pain issues, if any:  Chronic back pain - right lower. Denies new pain.  SAFETY ISSUES:  Prior radiation? denies  Pacemaker/ICD? YES  Possible current pregnancy? no, male patient  Is the patient on methotrexate? denies  Current Complaints / other details:  69 year old male. Married with 3 sons. Retired. Moved to [place] in 2020 to be closer to his grandchildren. Father - stomach cancer. Brother - bladder cancer.

## 2020-07-12 NOTE — Telephone Encounter (Signed)
    Pt said he missed his lab work on 06/29/20, he wanted to talk with Dr. Thompson Caul nurse what is the purpose of the blood work

## 2020-07-12 NOTE — Telephone Encounter (Signed)
Spoke with pt and explained why we needed the labs and the need to get them rescheduled.  Pt states that he has not actually started the Jardiance yet.  States he found out he has cancer and it has just been a whirlwind, so he never got it started.  Has consult with oncology/radiology tomorrow to discuss plan.  Advised if they feel it is ok for him to start this medication, let me know and we will get the labs rescheduled.  Pt verbalized understanding.

## 2020-07-13 ENCOUNTER — Encounter: Payer: Self-pay | Admitting: Radiation Oncology

## 2020-07-13 ENCOUNTER — Other Ambulatory Visit: Payer: Self-pay

## 2020-07-13 ENCOUNTER — Encounter: Payer: Self-pay | Admitting: Medical Oncology

## 2020-07-13 ENCOUNTER — Ambulatory Visit
Admission: RE | Admit: 2020-07-13 | Discharge: 2020-07-13 | Disposition: A | Discharge status: Home / Self Care | Payer: 59 | Source: Ambulatory Visit | Attending: Radiation Oncology | Admitting: Radiation Oncology

## 2020-07-13 ENCOUNTER — Ambulatory Visit: Payer: 59 | Admitting: Radiation Oncology

## 2020-07-13 VITALS — BP 118/73 | HR 69 | Temp 97.6°F | Resp 18 | Ht 67.0 in | Wt 161.0 lb

## 2020-07-13 DIAGNOSIS — I10 Essential (primary) hypertension: Secondary | ICD-10-CM | POA: Insufficient documentation

## 2020-07-13 DIAGNOSIS — I509 Heart failure, unspecified: Secondary | ICD-10-CM | POA: Diagnosis not present

## 2020-07-13 DIAGNOSIS — Z8052 Family history of malignant neoplasm of bladder: Secondary | ICD-10-CM | POA: Diagnosis not present

## 2020-07-13 DIAGNOSIS — I2510 Atherosclerotic heart disease of native coronary artery without angina pectoris: Secondary | ICD-10-CM | POA: Insufficient documentation

## 2020-07-13 DIAGNOSIS — I429 Cardiomyopathy, unspecified: Secondary | ICD-10-CM | POA: Insufficient documentation

## 2020-07-13 DIAGNOSIS — N529 Male erectile dysfunction, unspecified: Secondary | ICD-10-CM | POA: Insufficient documentation

## 2020-07-13 DIAGNOSIS — Z800 Family history of malignant neoplasm of digestive organs: Secondary | ICD-10-CM | POA: Diagnosis not present

## 2020-07-13 DIAGNOSIS — C61 Malignant neoplasm of prostate: Secondary | ICD-10-CM | POA: Diagnosis not present

## 2020-07-13 DIAGNOSIS — E785 Hyperlipidemia, unspecified: Secondary | ICD-10-CM | POA: Insufficient documentation

## 2020-07-13 DIAGNOSIS — L570 Actinic keratosis: Secondary | ICD-10-CM | POA: Insufficient documentation

## 2020-07-13 DIAGNOSIS — I252 Old myocardial infarction: Secondary | ICD-10-CM | POA: Insufficient documentation

## 2020-07-13 DIAGNOSIS — Z7982 Long term (current) use of aspirin: Secondary | ICD-10-CM | POA: Insufficient documentation

## 2020-07-13 DIAGNOSIS — D649 Anemia, unspecified: Secondary | ICD-10-CM | POA: Insufficient documentation

## 2020-07-13 DIAGNOSIS — Z79899 Other long term (current) drug therapy: Secondary | ICD-10-CM | POA: Diagnosis not present

## 2020-07-13 DIAGNOSIS — R9721 Rising PSA following treatment for malignant neoplasm of prostate: Secondary | ICD-10-CM | POA: Insufficient documentation

## 2020-07-13 NOTE — Progress Notes (Signed)
Radiation Oncology         [phone] ________________________________  Initial Outpatient Consultation  Name: Kurt Reilly MRN: [id number]  Date: 07/13/2020  DOB: October 10, 1950  CC:Kurt Reilly, [place]  Irine Seal, MD   REFERRING PHYSICIAN: Irine Seal, MD  DIAGNOSIS: 70 y.o. gentleman with a biochemical recurrence of prostate cancer with a PSA of 0.67 s/p RALP in 12/2009 for Stage pT2a N0, Gleason 3+3 prostate cancer    ICD-10-CM   1. Malignant neoplasm of prostate ([place])  C61     HISTORY OF PRESENT ILLNESS: Kurt Reilly is a 70 y.o. male with a diagnosis of biochemically recurrent prostate cancer. He was initially diagnosed with Gleason 4+3 prostate cancer in 2011 with a PSA of 4.8. He opted to proceed with RALP on 12/28/09, performed by Dr. Colon Branch in [place]. Pathology revealed Gleason 3+3 prostatic adenocarcinoma with 1% tissue involvement, negative margins, perineural invasion present, and negative lymph nodes. His postoperative PSA was undetectable.  More recently, he was referred to Dr. Jeffie Pollock on 04/12/20 for a rising PSA of 0.46 in 02/2020 and up to 0.52 04/08/20. He underwent re-staging CT A/P and bone scan on 05/04/20, both of which were negative for metastatic disease. A PSMA scan has been ordered and has recently been approved by the patient's insurance so this will be scheduled in the near future. A repeat PSA on 06/30/20 showed further elevation to 0.67.  The patient reviewed the lab results with his urologist and he has kindly been referred today for discussion of potential salvage radiation treatment options.   PREVIOUS RADIATION THERAPY: No  PAST MEDICAL HISTORY:  Past Medical History:  Diagnosis Date  . Actinic keratosis   . Alkaline phosphatase raised   . Anemia   . Cardiac arrhythmia   . Cardiomyopathy ([place])   . CHF (congestive heart failure) ([place])   . Coronary arteriosclerosis   . ED (erectile dysfunction)   . Gout 03/09/2017  . Hemangioma 08/24/2018  .  Hyperlipidemia   . Hypertension   . Hypertrophic scar   . Malignant neoplasm of prostate ([place]) 2013   Was told that less than 1% of cancer  . Melanocytic nevi of trunk   . Melanocytic nevus of skin   . Senile hyperkeratosis   . STEMI (ST elevation myocardial infarction) ([place]) 2016      PAST SURGICAL HISTORY: Past Surgical History:  Procedure Laterality Date  . ICD IMPLANT    . PROSTATECTOMY  2013  . SUBQ ICD CHANGEOUT N/A 06/07/2020   Procedure: [place];  Surgeon: Evans Lance, MD;  Location: [facility];  Service: Cardiovascular;  Laterality: N/A;    FAMILY HISTORY:  Family History  Problem Relation Age of Onset  . Hypertension Father   . High Cholesterol Father   . Stomach cancer Father   . Yves Dill Parkinson White syndrome Son   . Migraines Son   . Bladder Cancer Brother   . Prostate cancer Neg Hx   . Breast cancer Neg Hx   . Colon cancer Neg Hx   . Pancreatic cancer Neg Hx     SOCIAL HISTORY:  Social History   Socioeconomic History  . Marital status: Married    Spouse name: [name]  . Number of children: 3  . Years of education: Not on file  . Highest education level: Not on file  Occupational History    Comment: retired  Tobacco Use  . Smoking status: Never Smoker  . Smokeless tobacco: Never Used  Vaping Use  .  Vaping Use: Never used  Substance and Sexual Activity  . Alcohol use: Yes    Comment: 2 glasses of wine per week  . Drug use: Never  . Sexual activity: Yes  Other Topics Concern  . Not on file  Social History Narrative   Moved to [place] in Oct 2020 to be closer to grandchildren   Retired IT sales professional, Clinical biochemist, Training and development officer   Married   3 sons   Social Determinants of Radio broadcast assistant Strain: Not on [employer] Insecurity: Not on file  Transportation Needs: Not on file  Physical Activity: Not on file  Stress: Not on file  Social Connections: Not on file  Intimate Partner Violence: Not on file     ALLERGIES: Ezetimibe, Statins, and Penicillins  MEDICATIONS:  Current Outpatient Medications  Medication Sig Dispense Refill  . allopurinol (ZYLOPRIM) 300 MG tablet TAKE 1 TABLET BY MOUTH EVERY DAY 90 tablet 0  . aspirin EC 81 MG tablet Take 81 mg by mouth daily. Swallow whole.    . B Complex-C-Folic Acid (STRESS XX123456 B-COMPLEX PO) Take 1 tablet by mouth daily.    Marland Kitchen BLACK ELDERBERRY PO Take 1 each by mouth every other day.    . carvedilol (COREG) 6.25 MG tablet Take 1 tablet (6.25 mg total) by mouth 2 (two) times daily with a meal. 180 tablet 3  . Cholecalciferol (VITAMIN D-3) 25 MCG (1000 UT) CAPS Take 1,000 Units by mouth daily.    . clopidogrel (PLAVIX) 75 MG tablet Take 1 tablet (75 mg total) by mouth daily. 90 tablet 3  . ferrous sulfate 325 (65 FE) MG tablet Take 325 mg by mouth daily with breakfast.    . fluticasone (FLONASE) 50 MCG/ACT nasal spray Place 1 spray into both nostrils daily as needed for allergies or rhinitis.    Marland Kitchen MILK THISTLE PO Take 525 mg by mouth every other day.     Marland Kitchen REPATHA SURECLICK [id number] MG/ML SOAJ INJECT 140MG  SUBCUTANEOUSLY EVERY 2 WEEKS 6 mL 3  . sacubitril-valsartan (ENTRESTO) 49-51 MG Take 1 tablet by mouth 2 (two) times daily. 180 tablet 3  . spironolactone (ALDACTONE) 25 MG tablet Take 12.5 mg by mouth daily.    . Colchicine 0.6 MG CAPS Take 0.6 mg by mouth daily as needed (gout). (Patient not taking: Reported on 07/13/2020)    . empagliflozin (JARDIANCE) 10 MG TABS tablet Take 1 tablet (10 mg total) by mouth daily before breakfast. (Patient not taking: Reported on 07/13/2020) 30 tablet 11  . nitroGLYCERIN (NITROSTAT) 0.4 MG SL tablet Place 1 tablet (0.4 mg total) under the tongue every 5 (five) minutes as needed for chest pain. (Patient not taking: Reported on 07/13/2020) 25 tablet 6   No current facility-administered medications for this encounter.    REVIEW OF SYSTEMS:  On review of systems, the patient reports that he is doing well overall. He denies  any chest pain, shortness of breath, cough, fevers, chills, night sweats, unintended weight changes. He denies any bowel disturbances, and denies abdominal pain, nausea or vomiting. He denies any new musculoskeletal or joint aches or pains. His IPSS was 0, indicating no urinary symptoms. His SHIM was 13, indicating he has moderate erectile dysfunction. A complete review of systems is obtained and is otherwise negative.    PHYSICAL EXAM:  Wt Readings from Last 3 Encounters:  07/13/20 161 lb (73 kg)  06/18/20 161 lb 6.4 oz (73.2 kg)  06/09/20 159 lb 3.2 oz (72.2 kg)   Temp  Readings from Last 3 Encounters:  07/13/20 97.6 F (36.4 C)  06/07/20 99 F (37.2 C) (Oral)  04/07/20 98.7 F (37.1 C) (Oral)   BP Readings from Last 3 Encounters:  07/13/20 118/73  06/18/20 118/70  06/09/20 118/72   Pulse Readings from Last 3 Encounters:  07/13/20 69  06/18/20 68  06/09/20 68   Pain Assessment Pain Score: 0-No pain/10  In general this is a well appearing Caucasian male in no acute distress. He's alert and oriented x4 and appropriate throughout the examination. Cardiopulmonary assessment is negative for acute distress, and he exhibits normal effort.     KPS = 100  100 - Normal; no complaints; no evidence of disease. 90   - Able to carry on normal activity; minor signs or symptoms of disease. 80   - Normal activity with effort; some signs or symptoms of disease. 19   - Cares for self; unable to carry on normal activity or to do active work. 60   - Requires occasional assistance, but is able to care for most of his personal needs. 50   - Requires considerable assistance and frequent medical care. 41   - Disabled; requires special care and assistance. 8   - Severely disabled; hospital admission is indicated although death not imminent. 63   - Very sick; hospital admission necessary; active supportive treatment necessary. 10   - Moribund; fatal processes progressing rapidly. 0     -  Dead  Karnofsky DA, Abelmann [place], Craver LS and Burchenal [facility] [phone]) The use of the nitrogen mustards in the palliative treatment of carcinoma: with particular reference to bronchogenic carcinoma Cancer 1 634-56  LABORATORY DATA:  Lab Results  Component Value Date   WBC 5.6 05/18/2020   HGB 13.9 05/18/2020   HCT 40.6 05/18/2020   MCV 99 (H) 05/18/2020   PLT 250 05/18/2020   Lab Results  Component Value Date   NA 140 05/18/2020   K 4.4 05/18/2020   CL 100 05/18/2020   CO2 21 05/18/2020   Lab Results  Component Value Date   ALT 22 07/24/2019   AST 21 07/24/2019   ALKPHOS 118 07/24/2019   BILITOT 0.5 07/24/2019     RADIOGRAPHY: CUP PACEART INCLINIC DEVICE CHECK  Result Date: 06/17/2020 Subcutaneous ICD wound check in clinic. Steri-strips removed, wound edges well approximated, wound area with flucuating edema. Dr Lovena Le in to assess wound site. patient to hold Brilinta until 06/24/20 and call if he has increased swelling.  0 untreated episodes; 0 treated episodes; 0 shocks delivered. Electrode impedance status okay. No programming changes. Remaining longevity to ERI 100%. See scanned in report.     IMPRESSION/PLAN: 1. 70 y.o. gentleman with a biochemical recurrence of prostate cancer with a PSA of 0.67 s/p RALP in 12/2009 for Stage pT2a N0, Gleason 3+3 prostate cancer.  Today, we reviewed the findings and workup thus far.  We discussed the natural history of prostate cancer.  We reviewed the the implications of positive margins, extracapsular extension, and seminal vesicle involvement on the risk of prostate cancer recurrence. In his case, only perineural invasion was present. We reviewed the increasing evidence that careful surveillance with ultrasensitive PSA may provide an opportunity for early salvage in patients who undergo observation, which can lead to excellent results in terms of disease control and survival. With a rising, detectable post-operative PSA, it appears that he  would benefit from salvage radiotherapy at this point. We discussed the role of PSMA imaging in helping to confirm  localized disease recurrence versus metastatic disease and how the findings on this study would help inform final treatment recommendations.  If there is localized disease only, we would recommend proceeding with a 7.5 week course of salvage radiotherapy to the prostate fossa and pelvic nodes. If there are oligometastases identified, we would still consider definitive treatment of the prostate and more targeted treatment of oligometastatic deposits. If there was widespread metastasis which felt unlikely, the recommendation may change to a more palliative approach with ADT. Anticipating that PSMA confirms localized disease, we discussed radiation treatment directed to the prostatic fossa and pelvic lymph nodes with regard to the logistics and delivery of external beam radiation treatment.  At the conclusion of our conversation, the patient is interested in moving forward with the PSMA scan as recommended and if appropriate, based on those findings, he would like to proceed with 7.5 weeks of salvage external beam therapy. We will share our discussion with Dr. Jeffie Pollock and once we have a date for PSMA scan, we will proceed with scheduling CT SIM/treatment planning shortly thereafter. The patient appears to have a good understanding of his disease and our treatment recommendations which are of curative intent and is in agreement with the stated plan.  Therefore, we will move forward with treatment planning accordingly, in anticipation of beginning IMRT in the near future.  We spent 60 minutes face to face with the patient and more than 50% of that time was spent in counseling and/or coordination of care.    Nicholos Johns, PA-C    Tyler Pita, MD  [facility] Direct Dial: [phone]  Fax: [phone] [email or web address]  Skype  [email or web address]   This document serves as a  record of services personally performed by Tyler Pita, MD and Freeman Caldron, PA-C. It was created on their behalf by Wilburn Mylar, a trained medical scribe. The creation of this record is based on the scribe's personal observations and the provider's statements to them. This document has been checked and approved by the attending provider.

## 2020-07-13 NOTE — Progress Notes (Signed)
Introduced myself to patient and his wife, as the prostate nurse navigator and discussed my role. He had a robotic prostatectomy in 2011 and now with a rising PSA. Dr. Jeffie Pollock ordered a PSMA PET scan but insurance would not approve. He had a CT and bone scan and now hoping for the PET. I will follow up on status. No barriers to care identified at this time. I gave them my contact information and asked them to call me with questions or concerns.

## 2020-07-13 NOTE — Progress Notes (Signed)
Spoke with [facility] for status of PET. PET was approved this morning and order  faxed to [facility], nuclear med at [facility]. Patient and wife notified.

## 2020-07-14 ENCOUNTER — Encounter: Payer: Self-pay | Admitting: Licensed Clinical Social Worker

## 2020-07-14 ENCOUNTER — Telehealth: Payer: Self-pay

## 2020-07-14 NOTE — Telephone Encounter (Signed)
Called in and stated that they were having back pains and muscle pains, fatigued, foggy headed, while on repatha. Pt would like to know if he can switch back to praluent if we can do another prior authorization. I advised the pt that yes we can apply to get the praluent 150mg  and see what happens

## 2020-07-14 NOTE — Progress Notes (Signed)
[facility] Psychosocial Distress Screening Clinical Social Work  Clinical Social Work was referred by distress screening protocol.  The patient scored a 5 on the Psychosocial Distress Thermometer which indicates moderate distress. Clinical Social Worker attempted to contact patient by phone to assess for distress and other psychosocial needs.  No answer. Left VM including direct contact information.  ONCBCN DISTRESS SCREENING 07/13/2020  Screening Type Initial Screening  Distress experienced in past week (1-10) 5  Spiritual/Religous concerns type Facing my mortality  Information Concerns Type Lack of info about diagnosis;Lack of info about treatment  Physician notified of physical symptoms Yes  Referral to clinical psychology No  Referral to clinical social work No  Referral to dietition No  Referral to financial advocate No  Referral to support programs Yes  Referral to palliative care No       Kurt Veale E Kasie Leccese, LCSW

## 2020-07-15 ENCOUNTER — Other Ambulatory Visit (HOSPITAL_COMMUNITY): Payer: Self-pay | Admitting: Urology

## 2020-07-15 DIAGNOSIS — C61 Malignant neoplasm of prostate: Secondary | ICD-10-CM

## 2020-07-15 DIAGNOSIS — R9721 Rising PSA following treatment for malignant neoplasm of prostate: Secondary | ICD-10-CM

## 2020-07-16 ENCOUNTER — Telehealth: Payer: Self-pay | Admitting: *Deleted

## 2020-07-16 NOTE — Telephone Encounter (Signed)
CALLED PATIENT TO ASK ABOUT COMING FOR SIM ON 07-29-20 @ 8 AM, SPOKE WITH PATIENT AND HE AGREED TO DO SO

## 2020-07-20 ENCOUNTER — Encounter: Payer: Self-pay | Admitting: Medical Oncology

## 2020-07-20 NOTE — Progress Notes (Signed)
Returned call to patient to discuss FMLA forms. I asked them to bring forms in 5/26 and we will get them completed. I asked them to call me with questions or concerns.

## 2020-07-22 ENCOUNTER — Encounter: Payer: Self-pay | Admitting: Medical Oncology

## 2020-07-22 NOTE — Telephone Encounter (Signed)
Left message to call back.  Was calling to see what Oncologist said about starting Jardiance.

## 2020-07-26 ENCOUNTER — Other Ambulatory Visit: Payer: Self-pay

## 2020-07-26 ENCOUNTER — Ambulatory Visit (HOSPITAL_COMMUNITY)
Admission: RE | Admit: 2020-07-26 | Discharge: 2020-07-26 | Disposition: A | Discharge status: Home / Self Care | Payer: 59 | Source: Ambulatory Visit | Attending: Urology | Admitting: Urology

## 2020-07-26 DIAGNOSIS — C61 Malignant neoplasm of prostate: Secondary | ICD-10-CM | POA: Diagnosis not present

## 2020-07-26 DIAGNOSIS — R9721 Rising PSA following treatment for malignant neoplasm of prostate: Secondary | ICD-10-CM | POA: Insufficient documentation

## 2020-07-26 MED ORDER — PIFLIFOLASTAT F 18 (PYLARIFY) INJECTION
9.0000 | Freq: Once | INTRAVENOUS | Status: AC
  Administered 2020-07-26: 9.95 via INTRAVENOUS

## 2020-07-27 NOTE — Telephone Encounter (Signed)
Received a VM from Amy from [facility] calling with questions regarding PA for [employer]. Call back # [phone]

## 2020-07-27 NOTE — Telephone Encounter (Signed)
LMOM FOR AMY FROM [employer] TO RETURN MY CALL

## 2020-07-27 NOTE — Telephone Encounter (Signed)
[employer] CALLED AND I PROVIDED ANSWERS TO CLINICAL QUESTIONS

## 2020-07-28 ENCOUNTER — Telehealth: Payer: Self-pay | Admitting: *Deleted

## 2020-07-28 NOTE — Telephone Encounter (Signed)
CALLED PATIENT TO REMIND OF SIM APPT. FOR 07-29-20 - ARRIVAL TIME- 8:15 AM, SPOKE WITH PATIENT AND HE IS AWARE OF THIS APPT.

## 2020-07-28 NOTE — Progress Notes (Signed)
  Radiation Oncology         [phone] ________________________________  Name: Kurt Reilly MRN: [id number]  Date: 07/29/2020  DOB: 08/11/1950  SIMULATION AND TREATMENT PLANNING NOTE    ICD-10-CM   1. Biochemically recurrent malignant neoplasm of prostate [name])  C61    R97.21     DIAGNOSIS:  70 y.o. gentleman with a biochemical recurrence of prostate cancer with a PSA of 0.67 s/p RALP in 12/2009 for Stage pT2a N0, Gleason 3+3 prostate cancer.  NARRATIVE:  The patient was brought to the [facility].  Identity was confirmed.  All relevant records and images related to the planned course of therapy were reviewed.  The patient freely provided informed written consent to proceed with treatment after reviewing the details related to the planned course of therapy. The consent form was witnessed and verified by the simulation staff.  Then, the patient was set-up in a stable reproducible supine position for radiation therapy.  A vacuum lock pillow device was custom fabricated to position his legs in a reproducible immobilized position.  Then, I performed a urethrogram under sterile conditions to identify the prostatic apex.  CT images were obtained.  Surface markings were placed.  The CT images were loaded into the planning software.  Then the prostate target and avoidance structures including the rectum, bladder, bowel and hips were contoured.  Treatment planning then occurred.  The radiation prescription was entered and confirmed.  A total of one complex treatment devices was fabricated. I have requested : Intensity Modulated Radiotherapy (IMRT) is medically necessary for this case for the following reason:  Rectal sparing.Marland Kitchen  PLAN:  The patient will receive 68.4 Gy in 38 fractions.  ________________________________  Sheral Apley Tammi Klippel, M.D.

## 2020-07-29 ENCOUNTER — Ambulatory Visit
Admission: RE | Admit: 2020-07-29 | Discharge: 2020-07-29 | Disposition: A | Discharge status: Home / Self Care | Payer: 59 | Source: Ambulatory Visit | Attending: Radiation Oncology | Admitting: Radiation Oncology

## 2020-07-29 ENCOUNTER — Encounter: Payer: Self-pay | Admitting: Medical Oncology

## 2020-07-29 ENCOUNTER — Telehealth: Payer: Self-pay | Admitting: *Deleted

## 2020-07-29 ENCOUNTER — Other Ambulatory Visit: Payer: Self-pay

## 2020-07-29 DIAGNOSIS — C61 Malignant neoplasm of prostate: Secondary | ICD-10-CM | POA: Insufficient documentation

## 2020-07-29 DIAGNOSIS — R9721 Rising PSA following treatment for malignant neoplasm of prostate: Secondary | ICD-10-CM

## 2020-07-29 NOTE — Telephone Encounter (Addendum)
Navigator denies patient was given form as indicated with forms tracker sheet.  Patient called today requesting pick up.  Seen 07/13/2020 and earlier today.      This nurse unable to determine status or locate travel insurance form.  LPN reported completed forms were returned to [facility].        Discovered form with new forms obtained rounding 07/26/2020.    Completed correcting "ability to travel section continued on page 2 prepared to return via fax with receipt of signed ROI for "all medical records" six months before expected date of travel. Not faxed.  Navigator says patient did not complete his sections and does not want [facility] to return form.   Met patient in lobby to sign [facility] Authorization of Disclosure and complete form.    "I do not know information to complete [employer] form.  My son and daughter will complete and return by e-mail." Aware today's visit note is incomplete.

## 2020-07-29 NOTE — Progress Notes (Signed)
Called patient to let him Roz will call him regarding trip cancellation forms. He voiced understanding.

## 2020-07-29 NOTE — Progress Notes (Signed)
Pt called asking about forms he left to cancel a trip. He was here for CT simulation and forgot to check on them. I will follow up on the forms and call him back. He voiced understanding.

## 2020-08-03 ENCOUNTER — Telehealth: Payer: Self-pay | Admitting: Interventional Cardiology

## 2020-08-03 NOTE — Telephone Encounter (Signed)
Spoke with patient and let him know the provider was not in the office today and his nurse isn't either but I would forward this information to them.  Patient is appreciative of call.

## 2020-08-03 NOTE — Telephone Encounter (Signed)
Patient would like Dr. Tamala Julian to go over his CT that was done 05.26.22. The results showed some calcium on his heart that he wanted to have Dr. Thompson Caul advice on.   Please call

## 2020-08-04 MED ORDER — PRALUENT 150 MG/ML ~~LOC~~ SOAJ: 150.0000 mg | SUBCUTANEOUS | 3 refills | Status: DC

## 2020-08-04 NOTE — Telephone Encounter (Signed)
Returned a call to the pt and stated that they were approved for the praluent 150, rx sent, pt instructed to call back if they have issues applying for the copay card

## 2020-08-04 NOTE — Addendum Note (Signed)
Addended by: Allean Found on: 08/04/2020 03:38 PM   Modules accepted: Orders

## 2020-08-06 DIAGNOSIS — C61 Malignant neoplasm of prostate: Secondary | ICD-10-CM | POA: Diagnosis not present

## 2020-08-09 NOTE — Telephone Encounter (Signed)
Spoke with pt and he is able to take the [place] but has not started it.  States the cost is outrageous and they are trying to figure out how to get it where it is affordable.  Spoke with pt about patient assistance.  He will contact them to see if he qualifies.  He and his wife are also looking into supplemental insurances since she is about to retire.  Pt does not want to start the Jardiance until he knows he can stay on it because he doesn't want to start something that he will end up stopping.  Advised pt to contact the office once he is able to start Jardiance so we are able to get labs on him.  Pt agreeable to plan.   Will route to prior auth nurse as an FYI that pt will be looking into patient assistance.

## 2020-08-09 NOTE — Telephone Encounter (Signed)
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 It is not surprising. Keeping the cholesterol, blood pressure, blood sugar under control and not smoking will help prevent progression.  ----- Message -----  From: Loren Racer, RN  Sent: 08/09/2020  1:10 PM EDT  To: Belva Crome, MD     Spoke with pt and made him aware of information from Dr. Tamala Julian.  Pt appreciative for call.

## 2020-08-10 ENCOUNTER — Other Ambulatory Visit: Payer: Self-pay

## 2020-08-10 ENCOUNTER — Ambulatory Visit
Admission: RE | Admit: 2020-08-10 | Discharge: 2020-08-10 | Disposition: A | Discharge status: Home / Self Care | Payer: 59 | Source: Ambulatory Visit | Attending: Radiation Oncology | Admitting: Radiation Oncology

## 2020-08-10 DIAGNOSIS — C61 Malignant neoplasm of prostate: Secondary | ICD-10-CM | POA: Diagnosis not present

## 2020-08-11 ENCOUNTER — Ambulatory Visit
Admission: RE | Admit: 2020-08-11 | Discharge: 2020-08-11 | Disposition: A | Discharge status: Home / Self Care | Payer: 59 | Source: Ambulatory Visit | Attending: Radiation Oncology | Admitting: Radiation Oncology

## 2020-08-11 DIAGNOSIS — C61 Malignant neoplasm of prostate: Secondary | ICD-10-CM | POA: Diagnosis not present

## 2020-08-12 ENCOUNTER — Other Ambulatory Visit: Payer: Self-pay

## 2020-08-12 ENCOUNTER — Ambulatory Visit
Admission: RE | Admit: 2020-08-12 | Discharge: 2020-08-12 | Disposition: A | Discharge status: Home / Self Care | Payer: 59 | Source: Ambulatory Visit | Attending: Radiation Oncology | Admitting: Radiation Oncology

## 2020-08-12 DIAGNOSIS — C61 Malignant neoplasm of prostate: Secondary | ICD-10-CM | POA: Diagnosis not present

## 2020-08-13 ENCOUNTER — Ambulatory Visit
Admission: RE | Admit: 2020-08-13 | Discharge: 2020-08-13 | Disposition: A | Discharge status: Home / Self Care | Payer: 59 | Source: Ambulatory Visit | Attending: Radiation Oncology | Admitting: Radiation Oncology

## 2020-08-13 DIAGNOSIS — C61 Malignant neoplasm of prostate: Secondary | ICD-10-CM | POA: Diagnosis not present

## 2020-08-13 NOTE — Progress Notes (Signed)
Pt here for patient teaching.    Pt given Radiation and You booklet and skin care instructions.    Reviewed areas of pertinence such as diarrhea, fatigue, hair loss, nausea and vomiting, sexual and fertility changes, skin changes, and urinary and bladder changes .   Pt able to give teach back of to pat skin, use unscented/gentle soap, and have Imodium on hand,avoid applying anything to skin within 4 hours of treatment.   Pt demonstrated understanding of information given and will contact nursing with any questions or concerns.    Http://[email or web address]/whattoexpect/index

## 2020-08-16 ENCOUNTER — Ambulatory Visit
Admission: RE | Admit: 2020-08-16 | Discharge: 2020-08-16 | Disposition: A | Discharge status: Home / Self Care | Payer: 59 | Source: Ambulatory Visit | Attending: Radiation Oncology | Admitting: Radiation Oncology

## 2020-08-16 ENCOUNTER — Other Ambulatory Visit: Payer: Self-pay

## 2020-08-16 DIAGNOSIS — C61 Malignant neoplasm of prostate: Secondary | ICD-10-CM | POA: Diagnosis not present

## 2020-08-17 ENCOUNTER — Other Ambulatory Visit: Payer: Self-pay

## 2020-08-17 ENCOUNTER — Ambulatory Visit
Admission: RE | Admit: 2020-08-17 | Discharge: 2020-08-17 | Disposition: A | Discharge status: Home / Self Care | Payer: 59 | Source: Ambulatory Visit | Attending: Radiation Oncology | Admitting: Radiation Oncology

## 2020-08-17 DIAGNOSIS — C61 Malignant neoplasm of prostate: Secondary | ICD-10-CM | POA: Diagnosis not present

## 2020-08-18 ENCOUNTER — Ambulatory Visit
Admission: RE | Admit: 2020-08-18 | Discharge: 2020-08-18 | Disposition: A | Discharge status: Home / Self Care | Payer: 59 | Source: Ambulatory Visit | Attending: Radiation Oncology | Admitting: Radiation Oncology

## 2020-08-18 DIAGNOSIS — C61 Malignant neoplasm of prostate: Secondary | ICD-10-CM | POA: Diagnosis not present

## 2020-08-19 ENCOUNTER — Ambulatory Visit
Admission: RE | Admit: 2020-08-19 | Discharge: 2020-08-19 | Disposition: A | Discharge status: Home / Self Care | Payer: 59 | Source: Ambulatory Visit | Attending: Radiation Oncology | Admitting: Radiation Oncology

## 2020-08-19 ENCOUNTER — Other Ambulatory Visit: Payer: Self-pay

## 2020-08-19 DIAGNOSIS — C61 Malignant neoplasm of prostate: Secondary | ICD-10-CM | POA: Diagnosis not present

## 2020-08-20 ENCOUNTER — Ambulatory Visit
Admission: RE | Admit: 2020-08-20 | Discharge: 2020-08-20 | Disposition: A | Discharge status: Home / Self Care | Payer: 59 | Source: Ambulatory Visit | Attending: Radiation Oncology | Admitting: Radiation Oncology

## 2020-08-20 DIAGNOSIS — C61 Malignant neoplasm of prostate: Secondary | ICD-10-CM | POA: Diagnosis not present

## 2020-08-23 ENCOUNTER — Ambulatory Visit
Admission: RE | Admit: 2020-08-23 | Discharge: 2020-08-23 | Disposition: A | Discharge status: Home / Self Care | Payer: 59 | Source: Ambulatory Visit | Attending: Radiation Oncology | Admitting: Radiation Oncology

## 2020-08-23 ENCOUNTER — Other Ambulatory Visit: Payer: Self-pay

## 2020-08-23 DIAGNOSIS — C61 Malignant neoplasm of prostate: Secondary | ICD-10-CM | POA: Diagnosis not present

## 2020-08-24 ENCOUNTER — Ambulatory Visit
Admission: RE | Admit: 2020-08-24 | Discharge: 2020-08-24 | Disposition: A | Discharge status: Home / Self Care | Payer: 59 | Source: Ambulatory Visit | Attending: Radiation Oncology | Admitting: Radiation Oncology

## 2020-08-24 DIAGNOSIS — C61 Malignant neoplasm of prostate: Secondary | ICD-10-CM | POA: Diagnosis not present

## 2020-08-25 ENCOUNTER — Ambulatory Visit
Admission: RE | Admit: 2020-08-25 | Discharge: 2020-08-25 | Disposition: A | Discharge status: Home / Self Care | Payer: 59 | Source: Ambulatory Visit | Attending: Radiation Oncology | Admitting: Radiation Oncology

## 2020-08-25 ENCOUNTER — Other Ambulatory Visit: Payer: Self-pay

## 2020-08-25 DIAGNOSIS — C61 Malignant neoplasm of prostate: Secondary | ICD-10-CM | POA: Diagnosis not present

## 2020-08-26 ENCOUNTER — Ambulatory Visit
Admission: RE | Admit: 2020-08-26 | Discharge: 2020-08-26 | Disposition: A | Discharge status: Home / Self Care | Payer: 59 | Source: Ambulatory Visit | Attending: Radiation Oncology | Admitting: Radiation Oncology

## 2020-08-26 DIAGNOSIS — C61 Malignant neoplasm of prostate: Secondary | ICD-10-CM | POA: Diagnosis not present

## 2020-08-27 ENCOUNTER — Ambulatory Visit
Admission: RE | Admit: 2020-08-27 | Discharge: 2020-08-27 | Disposition: A | Discharge status: Home / Self Care | Payer: 59 | Source: Ambulatory Visit | Attending: Radiation Oncology | Admitting: Radiation Oncology

## 2020-08-27 ENCOUNTER — Other Ambulatory Visit: Payer: Self-pay

## 2020-08-27 DIAGNOSIS — C61 Malignant neoplasm of prostate: Secondary | ICD-10-CM | POA: Diagnosis not present

## 2020-08-30 ENCOUNTER — Other Ambulatory Visit: Payer: Self-pay

## 2020-08-30 ENCOUNTER — Ambulatory Visit
Admission: RE | Admit: 2020-08-30 | Discharge: 2020-08-30 | Disposition: A | Discharge status: Home / Self Care | Payer: 59 | Source: Ambulatory Visit | Attending: Radiation Oncology | Admitting: Radiation Oncology

## 2020-08-30 DIAGNOSIS — C61 Malignant neoplasm of prostate: Secondary | ICD-10-CM | POA: Diagnosis not present

## 2020-08-31 ENCOUNTER — Ambulatory Visit
Admission: RE | Admit: 2020-08-31 | Discharge: 2020-08-31 | Disposition: A | Discharge status: Home / Self Care | Payer: 59 | Source: Ambulatory Visit | Attending: Radiation Oncology | Admitting: Radiation Oncology

## 2020-08-31 DIAGNOSIS — C61 Malignant neoplasm of prostate: Secondary | ICD-10-CM | POA: Diagnosis not present

## 2020-09-01 ENCOUNTER — Ambulatory Visit
Admission: RE | Admit: 2020-09-01 | Discharge: 2020-09-01 | Disposition: A | Discharge status: Home / Self Care | Payer: 59 | Source: Ambulatory Visit | Attending: Radiation Oncology | Admitting: Radiation Oncology

## 2020-09-01 ENCOUNTER — Telehealth: Payer: Self-pay | Admitting: Interventional Cardiology

## 2020-09-01 ENCOUNTER — Other Ambulatory Visit: Payer: Self-pay

## 2020-09-01 ENCOUNTER — Telehealth: Payer: Self-pay

## 2020-09-01 DIAGNOSIS — C61 Malignant neoplasm of prostate: Secondary | ICD-10-CM | POA: Diagnosis not present

## 2020-09-01 NOTE — Telephone Encounter (Signed)
Returning patient phone call.   Reports last night his wife woke up from sleep thinking she herd his device make a beeping noise. Manual transmission received and no alerts found on transmission. Spoke to Kurt Reilly and states patient device (SIC-D) can make a beeping noise but it should show as an alert on the remote. Patient advised to resend a manual transmission tomorrow just to assure. Patient advised and agreeable to plan.

## 2020-09-01 NOTE — Telephone Encounter (Signed)
Patient called in stating his wife heard a beep coming from his chest and thought something might be wrong, patient monitor is currently not working and BSX is closed today. Told patient I would I talk to nurse and someone will get back to him. Patient denies any symptoms

## 2020-09-02 ENCOUNTER — Ambulatory Visit
Admission: RE | Admit: 2020-09-02 | Discharge: 2020-09-02 | Disposition: A | Discharge status: Home / Self Care | Payer: 59 | Source: Ambulatory Visit | Attending: Radiation Oncology | Admitting: Radiation Oncology

## 2020-09-02 DIAGNOSIS — C61 Malignant neoplasm of prostate: Secondary | ICD-10-CM | POA: Diagnosis not present

## 2020-09-02 NOTE — Telephone Encounter (Signed)
Follow up transmission recovered and no alerts triggered on device. Left detail message on answering machine (ok per DPR). Advised if he has any further questions, hears further beeping or any concerns to please given the device clinic a call. Direct phone number left.

## 2020-09-03 ENCOUNTER — Ambulatory Visit
Admission: RE | Admit: 2020-09-03 | Discharge: 2020-09-03 | Disposition: A | Discharge status: Home / Self Care | Payer: 59 | Source: Ambulatory Visit | Attending: Radiation Oncology | Admitting: Radiation Oncology

## 2020-09-03 ENCOUNTER — Other Ambulatory Visit: Payer: Self-pay

## 2020-09-03 DIAGNOSIS — C61 Malignant neoplasm of prostate: Secondary | ICD-10-CM | POA: Diagnosis not present

## 2020-09-07 ENCOUNTER — Ambulatory Visit
Admission: RE | Admit: 2020-09-07 | Discharge: 2020-09-07 | Disposition: A | Discharge status: Home / Self Care | Payer: 59 | Source: Ambulatory Visit | Attending: Radiation Oncology | Admitting: Radiation Oncology

## 2020-09-07 ENCOUNTER — Other Ambulatory Visit: Payer: Self-pay

## 2020-09-07 DIAGNOSIS — C61 Malignant neoplasm of prostate: Secondary | ICD-10-CM | POA: Diagnosis not present

## 2020-09-08 ENCOUNTER — Ambulatory Visit (INDEPENDENT_AMBULATORY_CARE_PROVIDER_SITE_OTHER): Payer: 59

## 2020-09-08 ENCOUNTER — Ambulatory Visit: Payer: 59

## 2020-09-08 ENCOUNTER — Other Ambulatory Visit: Payer: Self-pay

## 2020-09-08 DIAGNOSIS — I429 Cardiomyopathy, unspecified: Secondary | ICD-10-CM | POA: Diagnosis not present

## 2020-09-08 MED ORDER — ENTRESTO 49-51 MG PO TABS: 1.0000 | ORAL_TABLET | Freq: Two times a day (BID) | ORAL | 3 refills | Status: DC

## 2020-09-09 ENCOUNTER — Other Ambulatory Visit: Payer: Self-pay

## 2020-09-09 ENCOUNTER — Encounter: Payer: Self-pay | Admitting: Internal Medicine

## 2020-09-09 ENCOUNTER — Ambulatory Visit
Admission: RE | Admit: 2020-09-09 | Discharge: 2020-09-09 | Disposition: A | Discharge status: Home / Self Care | Payer: 59 | Source: Ambulatory Visit | Attending: Radiation Oncology | Admitting: Radiation Oncology

## 2020-09-09 ENCOUNTER — Ambulatory Visit (INDEPENDENT_AMBULATORY_CARE_PROVIDER_SITE_OTHER): Payer: 59 | Admitting: Internal Medicine

## 2020-09-09 VITALS — BP 124/68 | HR 72 | Ht 67.0 in | Wt 162.2 lb

## 2020-09-09 DIAGNOSIS — C61 Malignant neoplasm of prostate: Secondary | ICD-10-CM | POA: Diagnosis not present

## 2020-09-09 DIAGNOSIS — Z95810 Presence of automatic (implantable) cardiac defibrillator: Secondary | ICD-10-CM | POA: Diagnosis not present

## 2020-09-09 DIAGNOSIS — I469 Cardiac arrest, cause unspecified: Secondary | ICD-10-CM

## 2020-09-09 DIAGNOSIS — I4901 Ventricular fibrillation: Secondary | ICD-10-CM

## 2020-09-09 LAB — CUP PACEART REMOTE DEVICE CHECK
Battery Remaining Percentage: 99 %
Date Time Interrogation Session: 20220707065700
Implantable Lead Implant Date: 20170727
Implantable Lead Location: 753862
Implantable Lead Model: 3401
Implantable Pulse Generator Implant Date: 20220404
Pulse Gen Serial Number: 156707

## 2020-09-09 NOTE — Patient Instructions (Signed)
Medication Instructions:  Your physician recommends that you continue on your current medications as directed. Please refer to the Current Medication list given to you today.  Labwork: None ordered.  Testing/Procedures: None ordered.  Follow-Up: Your physician wants you to follow-up in: one year with Cristopher Peru, MD or one of the following Advanced Practice Providers on your designated Care Team:   Tommye Standard, [place] Legrand Como "Jonni Sanger" Chalmers Cater, [place]  Remote monitoring is used to monitor your ICD from home. This monitoring reduces the number of office visits required to check your device to one time per year. It allows [place] to keep an eye on the functioning of your device to ensure it is working properly. You are scheduled for a device check from home on 12/08/2020. You may send your transmission at any time that day. If you have a wireless device, the transmission will be sent automatically. After your physician reviews your transmission, you will receive a postcard with your next transmission date.  Any Other Special Instructions Will Be Listed Below (If Applicable).  If you need a refill on your cardiac medications before your next appointment, please call your pharmacy.

## 2020-09-09 NOTE — Progress Notes (Signed)
HPI Kurt Reilly returns today after his S-ICD has reached ERI. He is a pleasant 70 yo man with an ICM, s/p MI with persistent LV dysfunction and EF 25%. He recently got Covid but has had a nice recovery. He has not received any ICD therapies. He has undergone S-ICD gen change out. He has been treated with XRT for recurrent prostate CA.  Allergies  Allergen Reactions   Ezetimibe Other (See Comments)    Muscle aches    Repatha [Evolocumab]     myalgias   Statins Other (See Comments)    Myalgia   Penicillins Rash    Occurred as a child - tolerated Zosyn June 2016      Current Outpatient Medications  Medication Sig Dispense Refill   Alirocumab (PRALUENT) 150 MG/ML SOAJ Inject 150 mg into the skin every 14 (fourteen) days. 6 mL 3   allopurinol (ZYLOPRIM) 300 MG tablet TAKE 1 TABLET BY MOUTH EVERY DAY 90 tablet 0   aspirin EC 81 MG tablet Take 81 mg by mouth daily. Swallow whole.     B Complex-C-Folic Acid (STRESS 676 B-COMPLEX PO) Take 1 tablet by mouth daily.     BLACK ELDERBERRY PO Take 1 each by mouth every other day.     carvedilol (COREG) 6.25 MG tablet Take 1 tablet (6.25 mg total) by mouth 2 (two) times daily with a meal. 180 tablet 3   Cholecalciferol (VITAMIN D-3) 25 MCG (1000 UT) CAPS Take 1,000 Units by mouth daily.     clopidogrel (PLAVIX) 75 MG tablet Take 1 tablet (75 mg total) by mouth daily. 90 tablet 3   Colchicine 0.6 MG CAPS Take 0.6 mg by mouth daily as needed (gout).     empagliflozin (JARDIANCE) 10 MG TABS tablet Take 1 tablet (10 mg total) by mouth daily before breakfast. 30 tablet 11   ferrous sulfate 325 (65 FE) MG tablet Take 325 mg by mouth daily with breakfast.     fluticasone (FLONASE) 50 MCG/ACT nasal spray Place 1 spray into both nostrils daily as needed for allergies or rhinitis.     MILK THISTLE PO Take 525 mg by mouth every other day.      nitroGLYCERIN (NITROSTAT) 0.4 MG SL tablet Place 1 tablet (0.4 mg total) under the tongue every 5 (five) minutes  as needed for chest pain. 25 tablet 6   sacubitril-valsartan (ENTRESTO) 49-51 MG Take 1 tablet by mouth 2 (two) times daily. 180 tablet 3   spironolactone (ALDACTONE) 25 MG tablet Take 12.5 mg by mouth daily.     No current facility-administered medications for this visit.     Past Medical History:  Diagnosis Date   Actinic keratosis    Alkaline phosphatase raised    Anemia    Cardiac arrhythmia    Cardiomyopathy ([place])    CHF (congestive heart failure) (HCC)    Coronary arteriosclerosis    ED (erectile dysfunction)    Gout 03/09/2017   Hemangioma 08/24/2018   Hyperlipidemia    Hypertension    Hypertrophic scar    Malignant neoplasm of prostate ([place]) 2013   Was told that less than 1% of cancer   Melanocytic nevi of trunk    Melanocytic nevus of skin    Senile hyperkeratosis    STEMI (ST elevation myocardial infarction) ([place]) 2016    ROS:   All systems reviewed and negative except as noted in the HPI.   Past Surgical History:  Procedure Laterality Date   ICD  IMPLANT     PROSTATECTOMY  2013   SUBQ ICD CHANGEOUT N/A 06/07/2020   Procedure: SUBQ ICD CHANGEOUT;  Surgeon: Evans Lance, MD;  Location: [facility];  Service: Cardiovascular;  Laterality: N/A;     Family History  Problem Relation Age of Onset   Hypertension Father    High Cholesterol Father    Stomach cancer Father    Delorse Limber White syndrome Son    Migraines Son    Bladder Cancer Brother    Prostate cancer Neg Hx    Breast cancer Neg Hx    Colon cancer Neg Hx    Pancreatic cancer Neg Hx      Social History   Socioeconomic History   Marital status: Married    Spouse name: [name]   Number of children: 3   Years of education: Not on file   Highest education level: Not on file  Occupational History    Comment: retired  Tobacco Use   Smoking status: Never   Smokeless tobacco: Never  Vaping Use   Vaping Use: Never used  Substance and Sexual Activity   Alcohol use: Yes     Comment: 2 glasses of wine per week   Drug use: Never   Sexual activity: Yes  Other Topics Concern   Not on file  Social History Narrative   Moved to [place] in Oct 2020 to be closer to grandchildren   Retired IT sales professional, Clinical biochemist, Training and development officer   Married   3 sons   Social Determinants of Radio broadcast assistant Strain: Not on Art therapist Insecurity: Not on file  Transportation Needs: Not on file  Physical Activity: Not on file  Stress: Not on file  Social Connections: Not on file  Intimate Partner Violence: Not on file     BP 124/68   Pulse 72   Ht 5\' 7"  (1.702 m)   Wt 162 lb 3.2 oz (73.6 kg)   SpO2 97%   BMI 25.40 kg/m   Physical Exam:  Well appearing NAD HEENT: Unremarkable Neck:  No JVD, no thyromegally Lymphatics:  No adenopathy Back:  No CVA tenderness Lungs:  Clear HEART:  Regular rate rhythm, no murmurs, no rubs, no clicks Abd:  soft, positive bowel sounds, no organomegally, no rebound, no guarding Ext:  2 plus pulses, no edema, no cyanosis, no clubbing Skin:  No rashes no nodules Neuro:  CN II through XII intact, motor grossly intact  EKG - NSR  DEVICE  Normal device function.  See PaceArt for details.   Assess/Plan:  Chronic systolic heart failure - He is on good medical therapy and his symptoms are class2. No change in meds. Prostate CA - we discussed his XRT. He has been mininimally symptomatic from his treatments so far. I encouraged him to continue to exercise. S-ICD - he appears to have healed up nicely. His device is working normally. CAD - he is s/p MI. He denies anginal symptoms. No change in meds.  Carleene Overlie Cayton Cuevas,MD

## 2020-09-10 ENCOUNTER — Ambulatory Visit
Admission: RE | Admit: 2020-09-10 | Discharge: 2020-09-10 | Disposition: A | Discharge status: Home / Self Care | Payer: 59 | Source: Ambulatory Visit | Attending: Radiation Oncology | Admitting: Radiation Oncology

## 2020-09-10 DIAGNOSIS — C61 Malignant neoplasm of prostate: Secondary | ICD-10-CM | POA: Diagnosis not present

## 2020-09-13 ENCOUNTER — Ambulatory Visit
Admission: RE | Admit: 2020-09-13 | Discharge: 2020-09-13 | Disposition: A | Discharge status: Home / Self Care | Payer: 59 | Source: Ambulatory Visit | Attending: Radiation Oncology | Admitting: Radiation Oncology

## 2020-09-13 ENCOUNTER — Other Ambulatory Visit: Payer: Self-pay

## 2020-09-13 DIAGNOSIS — C61 Malignant neoplasm of prostate: Secondary | ICD-10-CM | POA: Diagnosis not present

## 2020-09-14 ENCOUNTER — Ambulatory Visit
Admission: RE | Admit: 2020-09-14 | Discharge: 2020-09-14 | Disposition: A | Discharge status: Home / Self Care | Payer: 59 | Source: Ambulatory Visit | Attending: Radiation Oncology | Admitting: Radiation Oncology

## 2020-09-14 DIAGNOSIS — C61 Malignant neoplasm of prostate: Secondary | ICD-10-CM | POA: Diagnosis not present

## 2020-09-15 ENCOUNTER — Ambulatory Visit: Payer: 59

## 2020-09-16 ENCOUNTER — Other Ambulatory Visit: Payer: Self-pay | Admitting: Physician Assistant

## 2020-09-16 ENCOUNTER — Ambulatory Visit
Admission: RE | Admit: 2020-09-16 | Discharge: 2020-09-16 | Disposition: A | Discharge status: Home / Self Care | Payer: 59 | Source: Ambulatory Visit | Attending: Radiation Oncology | Admitting: Radiation Oncology

## 2020-09-16 DIAGNOSIS — M545 Low back pain, unspecified: Secondary | ICD-10-CM

## 2020-09-16 DIAGNOSIS — C61 Malignant neoplasm of prostate: Secondary | ICD-10-CM | POA: Diagnosis not present

## 2020-09-16 DIAGNOSIS — G8929 Other chronic pain: Secondary | ICD-10-CM

## 2020-09-17 ENCOUNTER — Other Ambulatory Visit: Payer: Self-pay

## 2020-09-17 ENCOUNTER — Ambulatory Visit
Admission: RE | Admit: 2020-09-17 | Discharge: 2020-09-17 | Disposition: A | Discharge status: Home / Self Care | Payer: 59 | Source: Ambulatory Visit | Attending: Radiation Oncology | Admitting: Radiation Oncology

## 2020-09-17 DIAGNOSIS — C61 Malignant neoplasm of prostate: Secondary | ICD-10-CM | POA: Diagnosis not present

## 2020-09-20 ENCOUNTER — Ambulatory Visit
Admission: RE | Admit: 2020-09-20 | Discharge: 2020-09-20 | Disposition: A | Discharge status: Home / Self Care | Payer: 59 | Source: Ambulatory Visit | Attending: Radiation Oncology | Admitting: Radiation Oncology

## 2020-09-20 ENCOUNTER — Other Ambulatory Visit: Payer: Self-pay

## 2020-09-20 DIAGNOSIS — C61 Malignant neoplasm of prostate: Secondary | ICD-10-CM | POA: Diagnosis not present

## 2020-09-21 ENCOUNTER — Ambulatory Visit
Admission: RE | Admit: 2020-09-21 | Discharge: 2020-09-21 | Disposition: A | Discharge status: Home / Self Care | Payer: 59 | Source: Ambulatory Visit | Attending: Radiation Oncology | Admitting: Radiation Oncology

## 2020-09-21 DIAGNOSIS — C61 Malignant neoplasm of prostate: Secondary | ICD-10-CM | POA: Diagnosis not present

## 2020-09-21 NOTE — Progress Notes (Signed)
Cardiology Office Note:    Date:  09/22/2020   ID:  Kurt Reilly, DOB 11-12-1950, MRN [id number]  PCP:  Kurt Coke, PA  Cardiologist:  Kurt Grooms, MD   Referring MD: Kurt Reilly, [place]   Chief Complaint  Patient presents with   Atrial Fibrillation   Coronary Artery Disease   Hypertension    History of Present Illness:    Kurt Reilly is a 70 y.o. male with a hx of CAD with anterior MI/cardiac arrest in May 2016 with 100% LAD with PCI - had associated cardiogenic shock/failure to wean - then had staged PCI to LCX and ostial LAD in June 2016 (total of 2 stents to the LAD) [facility], chronic systolic HF, ICM, underlying ICD (subcutaneous 2017), HLD, HTN and ED. EF was less than 35% -->improved to 40 to 45% --> most recent EF 25% (07/2019)   Seems to be sad.  Having radiation therapy for recurrent prostate cancer has caused him to feel down.  He denies orthopnea, PND, excessive dyspnea on exertion, and edema.  He has not needed nitroglycerin.  No medication side effects.  Past Medical History:  Diagnosis Date   Actinic keratosis    Alkaline phosphatase raised    Anemia    Cardiac arrhythmia    Cardiomyopathy [facility])    CHF (congestive heart failure) (HCC)    Coronary arteriosclerosis    ED (erectile dysfunction)    Gout 03/09/2017   Hemangioma 08/24/2018   Hyperlipidemia    Hypertension    Hypertrophic scar    Malignant neoplasm of prostate ([place]) 2013   Was told that less than 1% of cancer   Melanocytic nevi of trunk    Melanocytic nevus of skin    Senile hyperkeratosis    STEMI (ST elevation myocardial infarction) ([place]) 2016    Past Surgical History:  Procedure Laterality Date   ICD IMPLANT     PROSTATECTOMY  2013   SUBQ ICD CHANGEOUT N/A 06/07/2020   Procedure: SUBQ ICD CHANGEOUT;  Surgeon: Evans Lance, MD;  Location: [facility];  Service: Cardiovascular;  Laterality: N/A;    Current Medications: Current Meds  Medication Sig    Alirocumab (PRALUENT) 150 MG/ML SOAJ Inject 150 mg into the skin every 14 (fourteen) days.   allopurinol (ZYLOPRIM) 300 MG tablet TAKE 1 TABLET BY MOUTH EVERY DAY   aspirin EC 81 MG tablet Take 81 mg by mouth daily. Swallow whole.   B Complex-C-Folic Acid (STRESS 734 B-COMPLEX PO) Take 1 tablet by mouth daily.   BLACK ELDERBERRY PO Take 1 each by mouth every other day.   carvedilol (COREG) 6.25 MG tablet Take 1 tablet (6.25 mg total) by mouth 2 (two) times daily with a meal.   Cholecalciferol (VITAMIN D-3) 25 MCG (1000 UT) CAPS Take 1,000 Units by mouth daily.   clopidogrel (PLAVIX) 75 MG tablet Take 1 tablet (75 mg total) by mouth daily.   Colchicine 0.6 MG CAPS Take 0.6 mg by mouth daily as needed (gout).   empagliflozin (JARDIANCE) 10 MG TABS tablet Take 1 tablet (10 mg total) by mouth daily before breakfast.   ferrous sulfate 325 (65 FE) MG tablet Take 325 mg by mouth daily with breakfast.   fluticasone (FLONASE) 50 MCG/ACT nasal spray Place 1 spray into both nostrils daily as needed for allergies or rhinitis.   MILK THISTLE PO Take 525 mg by mouth every other day.    nitroGLYCERIN (NITROSTAT) 0.4 MG SL tablet Place 1 tablet (  0.4 mg total) under the tongue every 5 (five) minutes as needed for chest pain.   sacubitril-valsartan (ENTRESTO) 49-51 MG Take 1 tablet by mouth 2 (two) times daily.   spironolactone (ALDACTONE) 25 MG tablet Take 12.5 mg by mouth daily.     Allergies:   Ezetimibe, Repatha [evolocumab], Statins, and Penicillins   Social History   Socioeconomic History   Marital status: Married    Spouse name: Kurt Reilly   Number of children: 3   Years of education: Not on file   Highest education level: Not on file  Occupational History    Comment: retired  Tobacco Use   Smoking status: Never   Smokeless tobacco: Never  Vaping Use   Vaping Use: Never used  Substance and Sexual Activity   Alcohol use: Yes    Comment: 2 glasses of wine per week   Drug use: Never   Sexual  activity: Yes  Other Topics Concern   Not on file  Social History Narrative   Moved to [place] in Oct 2020 to be closer to grandchildren   Retired IT sales professional, Clinical biochemist, Training and development officer   Married   3 sons   Social Determinants of Radio broadcast assistant Strain: Not on Art therapist Insecurity: Not on file  Transportation Needs: Not on file  Physical Activity: Not on file  Stress: Not on file  Social Connections: Not on file     Family History: The patient's family history includes Bladder Cancer in his brother; High Cholesterol in his father; Hypertension in his father; Migraines in his son; Stomach cancer in his father; Yves Dill Parkinson White syndrome in his son. There is no history of Prostate cancer, Breast cancer, Colon cancer, or Pancreatic cancer.  ROS:   Please see the history of present illness.    Depression all other systems reviewed and are negative.  EKGs/Labs/Other Studies Reviewed:    The following studies were reviewed today: No new data all not repeated  EKG:  EKG not repeated  Recent Labs: 05/18/2020: BUN 31; Creatinine, Ser 1.31; Hemoglobin 13.9; Platelets 250; Potassium 4.4; Sodium 140  Recent Lipid Panel    Component Value Date/Time   CHOL 133 07/24/2019 0715   TRIG 195 (H) 07/24/2019 0715   HDL 39 (L) 07/24/2019 0715   CHOLHDL 3.4 07/24/2019 0715   LDLCALC 62 07/24/2019 0715    Physical Exam:    VS:  BP 112/60   Pulse 61   Ht 5\' 7"  (1.702 m)   Wt 159 lb 12.8 oz (72.5 kg)   SpO2 99%   BMI 25.03 kg/m     Wt Readings from Last 3 Encounters:  09/22/20 159 lb 12.8 oz (72.5 kg)  09/09/20 162 lb 3.2 oz (73.6 kg)  07/13/20 161 lb (73 kg)     GEN: Healthy appearing somewhat pale. No acute distress HEENT: Normal NECK: No JVD. LYMPHATICS: No lymphadenopathy CARDIAC: No murmur. RRR no gallop, or edema. VASCULAR:  Normal Pulses. No bruits. RESPIRATORY:  Clear to auscultation without rales, wheezing or rhonchi  ABDOMEN: Soft, non-tender,  non-distended, No pulsatile mass, MUSCULOSKELETAL: No deformity  SKIN: Warm and dry NEUROLOGIC:  Alert and oriented x 3 PSYCHIATRIC:  Normal affect   ASSESSMENT:    1. Coronary artery disease involving native coronary artery of native heart without angina pectoris   2. Chronic systolic congestive heart failure ([place])   3. ICD (implantable cardioverter-defibrillator) in place   4. Cardiac arrest with ventricular fibrillation ([place])   5. Essential hypertension  6. Hyperlipidemia LDL goal <70    PLAN:    In order of problems listed above:  Secondary prevention reviewed Quadruple therapy for heart failure.  Laboratory data today.  Echocardiogram in 6 months prior to the next office visit in 6 months. Consider continue follow-up in device clinic No syncope or recurrent issues with VF Excellent blood pressure control on current therapy for heart failure Continue high intensity management of lipids using Praluent.  Last LDL 62.    Guideline directed therapy for left ventricular systolic dysfunction: Angiotensin receptor-neprilysin inhibitor (ARNI)-Entresto; beta-blocker therapy - carvedilol, metoprolol succinate, or bisoprolol; mineralocorticoid receptor antagonist (MRA) therapy -spironolactone or eplerenone.  SGLT-2 agents -  Dapagliflozin Wilder [place]) or Empagliflozin (Jardiance).These therapies have been shown to improve clinical outcomes including reduction of rehospitalization, survival, and acute heart failure.    Medication Adjustments/Labs and Tests Ordered: Current medicines are reviewed at length with the patient today.  Concerns regarding medicines are outlined above.  Orders Placed This Encounter  Procedures   Basic metabolic panel   Hepatic function panel   Lipid panel   Pro b natriuretic peptide   TSH   ECHOCARDIOGRAM COMPLETE   No orders of the defined types were placed in this encounter.   Patient Instructions  Medication Instructions:  Your physician recommends  that you continue on your current medications as directed. Please refer to the Current Medication list given to you today.  *If you need a refill on your cardiac medications before your next appointment, please call your pharmacy*   Lab Work: BMET, Liver, Lipid, Pro BNP and TSH today  If you have labs (blood work) drawn today and your tests are completely normal, you will receive your results only by: [place] (if you have MyChart) OR A paper copy in the mail If you have any lab test that is abnormal or we need to change your treatment, we will call you to review the results.   Testing/Procedures: Your physician has requested that you have an echocardiogram 1-2 weeks prior to seeing Dr. Tamala Julian. Echocardiography is a painless test that uses sound waves to create images of your heart. It provides your doctor with information about the size and shape of your heart and how well your heart's chambers and valves are working. This procedure takes approximately one hour. There are no restrictions for this procedure   Follow-Up: At [facility], you and your health needs are our priority.  As part of our continuing mission to provide you with exceptional heart care, we have created designated Provider Care Teams.  These Care Teams include your primary Cardiologist (physician) and Advanced Practice Providers (APPs -  Physician Assistants and Nurse Practitioners) who all work together to provide you with the care you need, when you need it.  We recommend signing up for the patient portal called "MyChart".  Sign up information is provided on this After Visit Summary.  MyChart is used to connect with patients for Virtual Visits (Telemedicine).  Patients are able to view lab/test results, encounter notes, upcoming appointments, etc.  Non-urgent messages can be sent to your provider as well.   To learn more about what you can do with MyChart, go to [email or web address].    Your next appointment:    6 month(s)  The format for your next appointment:   In Person  Provider:   You may see Kurt Grooms, MD or one of the following Advanced Practice Providers on your designated Care Team:   Cecilie Kicks, NP  Other Instructions     Signed, Kurt Grooms, MD  09/22/2020 12:56 PM    [facility]

## 2020-09-22 ENCOUNTER — Other Ambulatory Visit: Payer: Self-pay

## 2020-09-22 ENCOUNTER — Ambulatory Visit (INDEPENDENT_AMBULATORY_CARE_PROVIDER_SITE_OTHER): Payer: 59 | Admitting: Interventional Cardiology

## 2020-09-22 ENCOUNTER — Encounter: Payer: Self-pay | Admitting: Interventional Cardiology

## 2020-09-22 ENCOUNTER — Ambulatory Visit
Admission: RE | Admit: 2020-09-22 | Discharge: 2020-09-22 | Disposition: A | Discharge status: Home / Self Care | Payer: 59 | Source: Ambulatory Visit | Attending: Radiation Oncology | Admitting: Radiation Oncology

## 2020-09-22 VITALS — BP 112/60 | HR 61 | Ht 67.0 in | Wt 159.8 lb

## 2020-09-22 DIAGNOSIS — I2510 Atherosclerotic heart disease of native coronary artery without angina pectoris: Secondary | ICD-10-CM | POA: Diagnosis not present

## 2020-09-22 DIAGNOSIS — Z95810 Presence of automatic (implantable) cardiac defibrillator: Secondary | ICD-10-CM

## 2020-09-22 DIAGNOSIS — I5022 Chronic systolic (congestive) heart failure: Secondary | ICD-10-CM | POA: Diagnosis not present

## 2020-09-22 DIAGNOSIS — I10 Essential (primary) hypertension: Secondary | ICD-10-CM

## 2020-09-22 DIAGNOSIS — I469 Cardiac arrest, cause unspecified: Secondary | ICD-10-CM

## 2020-09-22 DIAGNOSIS — I4901 Ventricular fibrillation: Secondary | ICD-10-CM

## 2020-09-22 DIAGNOSIS — C61 Malignant neoplasm of prostate: Secondary | ICD-10-CM | POA: Diagnosis not present

## 2020-09-22 DIAGNOSIS — E785 Hyperlipidemia, unspecified: Secondary | ICD-10-CM

## 2020-09-22 NOTE — Patient Instructions (Signed)
Medication Instructions:  Your physician recommends that you continue on your current medications as directed. Please refer to the Current Medication list given to you today.  *If you need a refill on your cardiac medications before your next appointment, please call your pharmacy*   Lab Work: BMET, Liver, Lipid, Pro BNP and TSH today  If you have labs (blood work) drawn today and your tests are completely normal, you will receive your results only by: [place] (if you have MyChart) OR A paper copy in the mail If you have any lab test that is abnormal or we need to change your treatment, we will call you to review the results.   Testing/Procedures: Your physician has requested that you have an echocardiogram 1-2 weeks prior to seeing Dr. Tamala Julian. Echocardiography is a painless test that uses sound waves to create images of your heart. It provides your doctor with information about the size and shape of your heart and how well your heart's chambers and valves are working. This procedure takes approximately one hour. There are no restrictions for this procedure   Follow-Up: At [facility], you and your health needs are our priority.  As part of our continuing mission to provide you with exceptional heart care, we have created designated Provider Care Teams.  These Care Teams include your primary Cardiologist (physician) and Advanced Practice Providers (APPs -  Physician Assistants and Nurse Practitioners) who all work together to provide you with the care you need, when you need it.  We recommend signing up for the patient portal called "MyChart".  Sign up information is provided on this After Visit Summary.  MyChart is used to connect with patients for Virtual Visits (Telemedicine).  Patients are able to view lab/test results, encounter notes, upcoming appointments, etc.  Non-urgent messages can be sent to your provider as well.   To learn more about what you can do with MyChart, go to  [email or web address].    Your next appointment:   6 month(s)  The format for your next appointment:   In Person  Provider:   You may see Sinclair Grooms, MD or one of the following Advanced Practice Providers on your designated Care Team:   Cecilie Kicks, NP    Other Instructions

## 2020-09-23 ENCOUNTER — Ambulatory Visit
Admission: RE | Admit: 2020-09-23 | Discharge: 2020-09-23 | Disposition: A | Discharge status: Home / Self Care | Payer: 59 | Source: Ambulatory Visit | Attending: Radiation Oncology | Admitting: Radiation Oncology

## 2020-09-23 DIAGNOSIS — C61 Malignant neoplasm of prostate: Secondary | ICD-10-CM | POA: Diagnosis not present

## 2020-09-23 LAB — HEPATIC FUNCTION PANEL
ALT: 17 IU/L (ref 0–44)
AST: 15 IU/L (ref 0–40)
Albumin: 4.9 g/dL — ABNORMAL HIGH (ref 3.8–4.8)
Alkaline Phosphatase: 93 IU/L (ref 44–121)
Bilirubin Total: 0.4 mg/dL (ref 0.0–1.2)
Bilirubin, Direct: 0.14 mg/dL (ref 0.00–0.40)
Total Protein: 7.1 g/dL (ref 6.0–8.5)

## 2020-09-23 LAB — BASIC METABOLIC PANEL
BUN/Creatinine Ratio: 21 (ref 10–24)
BUN: 23 mg/dL (ref 8–27)
CO2: 22 mmol/L (ref 20–29)
Calcium: 9.4 mg/dL (ref 8.6–10.2)
Chloride: 103 mmol/L (ref 96–106)
Creatinine, Ser: 1.12 mg/dL (ref 0.76–1.27)
Glucose: 102 mg/dL — ABNORMAL HIGH (ref 65–99)
Potassium: 4.6 mmol/L (ref 3.5–5.2)
Sodium: 139 mmol/L (ref 134–144)
eGFR: 72 mL/min/{1.73_m2} (ref >59)

## 2020-09-23 LAB — LIPID PANEL
Chol/HDL Ratio: 3 ratio (ref 0.0–5.0)
Cholesterol, Total: 120 mg/dL (ref 100–199)
HDL: 40 mg/dL (ref >39)
LDL Chol Calc (NIH): 48 mg/dL (ref 0–99)
Triglycerides: 198 mg/dL — ABNORMAL HIGH (ref 0–149)
VLDL Cholesterol Cal: 32 mg/dL (ref 5–40)

## 2020-09-23 LAB — TSH: TSH: 1.13 u[IU]/mL (ref 0.450–4.500)

## 2020-09-23 LAB — PRO B NATRIURETIC PEPTIDE: NT-Pro BNP: 494 pg/mL — ABNORMAL HIGH (ref 0–376)

## 2020-09-24 ENCOUNTER — Ambulatory Visit
Admission: RE | Admit: 2020-09-24 | Discharge: 2020-09-24 | Disposition: A | Discharge status: Home / Self Care | Payer: 59 | Source: Ambulatory Visit | Attending: Radiation Oncology | Admitting: Radiation Oncology

## 2020-09-24 ENCOUNTER — Other Ambulatory Visit: Payer: Self-pay

## 2020-09-24 DIAGNOSIS — C61 Malignant neoplasm of prostate: Secondary | ICD-10-CM | POA: Diagnosis not present

## 2020-09-27 ENCOUNTER — Other Ambulatory Visit: Payer: Self-pay

## 2020-09-27 ENCOUNTER — Ambulatory Visit
Admission: RE | Admit: 2020-09-27 | Discharge: 2020-09-27 | Disposition: A | Discharge status: Home / Self Care | Payer: 59 | Source: Ambulatory Visit | Attending: Radiation Oncology | Admitting: Radiation Oncology

## 2020-09-27 DIAGNOSIS — C61 Malignant neoplasm of prostate: Secondary | ICD-10-CM | POA: Diagnosis not present

## 2020-09-28 ENCOUNTER — Ambulatory Visit
Admission: RE | Admit: 2020-09-28 | Discharge: 2020-09-28 | Disposition: A | Discharge status: Home / Self Care | Payer: 59 | Source: Ambulatory Visit | Attending: Radiation Oncology | Admitting: Radiation Oncology

## 2020-09-28 DIAGNOSIS — C61 Malignant neoplasm of prostate: Secondary | ICD-10-CM | POA: Diagnosis not present

## 2020-09-29 ENCOUNTER — Ambulatory Visit
Admission: RE | Admit: 2020-09-29 | Discharge: 2020-09-29 | Disposition: A | Discharge status: Home / Self Care | Payer: 59 | Source: Ambulatory Visit | Attending: Radiation Oncology | Admitting: Radiation Oncology

## 2020-09-29 ENCOUNTER — Other Ambulatory Visit: Payer: Self-pay

## 2020-09-29 DIAGNOSIS — C61 Malignant neoplasm of prostate: Secondary | ICD-10-CM | POA: Diagnosis not present

## 2020-09-29 NOTE — Progress Notes (Signed)
Remote ICD transmission.   

## 2020-09-30 ENCOUNTER — Ambulatory Visit
Admission: RE | Admit: 2020-09-30 | Discharge: 2020-09-30 | Disposition: A | Discharge status: Home / Self Care | Payer: 59 | Source: Ambulatory Visit | Attending: Radiation Oncology | Admitting: Radiation Oncology

## 2020-09-30 DIAGNOSIS — C61 Malignant neoplasm of prostate: Secondary | ICD-10-CM | POA: Diagnosis not present

## 2020-10-01 ENCOUNTER — Ambulatory Visit: Payer: 59

## 2020-10-01 ENCOUNTER — Ambulatory Visit
Admission: RE | Admit: 2020-10-01 | Discharge: 2020-10-01 | Disposition: A | Discharge status: Home / Self Care | Payer: 59 | Source: Ambulatory Visit | Attending: Radiation Oncology | Admitting: Radiation Oncology

## 2020-10-01 ENCOUNTER — Other Ambulatory Visit: Payer: Self-pay

## 2020-10-01 DIAGNOSIS — C61 Malignant neoplasm of prostate: Secondary | ICD-10-CM | POA: Diagnosis not present

## 2020-10-04 ENCOUNTER — Ambulatory Visit
Admission: RE | Admit: 2020-10-04 | Discharge: 2020-10-04 | Disposition: A | Discharge status: Home / Self Care | Payer: 59 | Source: Ambulatory Visit | Attending: Radiation Oncology | Admitting: Radiation Oncology

## 2020-10-04 ENCOUNTER — Ambulatory Visit: Payer: 59

## 2020-10-04 DIAGNOSIS — C61 Malignant neoplasm of prostate: Secondary | ICD-10-CM | POA: Insufficient documentation

## 2020-10-04 DIAGNOSIS — Z510 Encounter for antineoplastic radiation therapy: Secondary | ICD-10-CM | POA: Insufficient documentation

## 2020-10-05 ENCOUNTER — Ambulatory Visit
Admission: RE | Admit: 2020-10-05 | Discharge: 2020-10-05 | Disposition: A | Discharge status: Home / Self Care | Payer: 59 | Source: Ambulatory Visit | Attending: Radiation Oncology | Admitting: Radiation Oncology

## 2020-10-05 ENCOUNTER — Encounter: Payer: Self-pay | Admitting: Urology

## 2020-10-05 ENCOUNTER — Other Ambulatory Visit: Payer: Self-pay

## 2020-10-05 DIAGNOSIS — C61 Malignant neoplasm of prostate: Secondary | ICD-10-CM | POA: Diagnosis not present

## 2020-10-05 DIAGNOSIS — R9721 Rising PSA following treatment for malignant neoplasm of prostate: Secondary | ICD-10-CM

## 2020-10-28 ENCOUNTER — Other Ambulatory Visit: Payer: Self-pay

## 2020-10-28 MED ORDER — PRALUENT 150 MG/ML ~~LOC~~ SOAJ: 150.0000 mg | SUBCUTANEOUS | 3 refills | Status: DC

## 2020-11-05 ENCOUNTER — Emergency Department (HOSPITAL_BASED_OUTPATIENT_CLINIC_OR_DEPARTMENT_OTHER): Payer: 59 | Admitting: Radiology

## 2020-11-05 ENCOUNTER — Other Ambulatory Visit: Payer: Self-pay

## 2020-11-05 ENCOUNTER — Emergency Department (HOSPITAL_BASED_OUTPATIENT_CLINIC_OR_DEPARTMENT_OTHER)
Admission: EM | Admit: 2020-11-05 | Discharge: 2020-11-06 | Disposition: A | Discharge status: Home / Self Care | Payer: 59 | Attending: Emergency Medicine | Admitting: Emergency Medicine

## 2020-11-05 DIAGNOSIS — S4991XA Unspecified injury of right shoulder and upper arm, initial encounter: Secondary | ICD-10-CM | POA: Diagnosis present

## 2020-11-05 DIAGNOSIS — I110 Hypertensive heart disease with heart failure: Secondary | ICD-10-CM | POA: Diagnosis not present

## 2020-11-05 DIAGNOSIS — I2510 Atherosclerotic heart disease of native coronary artery without angina pectoris: Secondary | ICD-10-CM | POA: Insufficient documentation

## 2020-11-05 DIAGNOSIS — I5021 Acute systolic (congestive) heart failure: Secondary | ICD-10-CM | POA: Diagnosis not present

## 2020-11-05 DIAGNOSIS — Y9355 Activity, bike riding: Secondary | ICD-10-CM | POA: Diagnosis not present

## 2020-11-05 DIAGNOSIS — Y92410 Unspecified street and highway as the place of occurrence of the external cause: Secondary | ICD-10-CM | POA: Insufficient documentation

## 2020-11-05 DIAGNOSIS — Z79899 Other long term (current) drug therapy: Secondary | ICD-10-CM | POA: Insufficient documentation

## 2020-11-05 DIAGNOSIS — Z8546 Personal history of malignant neoplasm of prostate: Secondary | ICD-10-CM | POA: Diagnosis not present

## 2020-11-05 DIAGNOSIS — S50311A Abrasion of right elbow, initial encounter: Secondary | ICD-10-CM | POA: Diagnosis not present

## 2020-11-05 DIAGNOSIS — Z7982 Long term (current) use of aspirin: Secondary | ICD-10-CM | POA: Insufficient documentation

## 2020-11-05 DIAGNOSIS — S42021A Displaced fracture of shaft of right clavicle, initial encounter for closed fracture: Secondary | ICD-10-CM | POA: Diagnosis not present

## 2020-11-05 DIAGNOSIS — Z7902 Long term (current) use of antithrombotics/antiplatelets: Secondary | ICD-10-CM | POA: Insufficient documentation

## 2020-11-05 MED ORDER — HYDROMORPHONE HCL 1 MG/ML IJ SOLN
1.0000 mg | Freq: Once | INTRAMUSCULAR | Status: AC
Start: 2020-11-05 — End: 2020-11-05
  Administered 2020-11-05: 1 mg via INTRAMUSCULAR
  Filled 2020-11-05: qty 1

## 2020-11-05 MED ORDER — OXYCODONE HCL 5 MG PO TABS: 5.0000 mg | ORAL_TABLET | ORAL | 0 refills | Status: AC | PRN

## 2020-11-05 MED ORDER — METHOCARBAMOL 500 MG PO TABS: 500.0000 mg | ORAL_TABLET | Freq: Three times a day (TID) | ORAL | 0 refills | Status: DC | PRN

## 2020-11-05 MED ORDER — OXYCODONE-ACETAMINOPHEN 5-325 MG PO TABS
1.0000 | ORAL_TABLET | Freq: Once | ORAL | Status: AC
  Administered 2020-11-05: 1 via ORAL
  Filled 2020-11-05: qty 1

## 2020-11-05 NOTE — Discharge Instructions (Addendum)
Recommend no weightbearing of your right arm and use sling until further instructed by Ortho.  Please follow-up with orthopedics and your primary care doctor.  Call their office on Tuesday to get a close follow-up appointment, ideally to be seen within this coming week. For pain control recommend using Tylenol, muscle relaxer and oxycodone as needed for breakthrough pain.  Note, this can make you drowsy and should not be taken while driving or operating heavy machinery.  Use the incentive spirometer as discussed.  If you develop fever, any difficulty in breathing, uncontrolled pain, please come back to ER for reassessment.

## 2020-11-05 NOTE — ED Notes (Signed)
RT educated pt on proper use of IS. Pt goal set at 2000 mLs pt able to perform properly and obtain 2250 mLs w/out difficulty. Pt expressed understanding of directions and purpose, all questions from pt answered. RT will continue to monitor.
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
## 2020-11-10 ENCOUNTER — Ambulatory Visit: Payer: 59 | Admitting: Orthopaedic Surgery

## 2020-11-10 ENCOUNTER — Other Ambulatory Visit: Payer: Self-pay

## 2020-11-10 ENCOUNTER — Encounter: Payer: Self-pay | Admitting: Physician Assistant

## 2020-11-10 ENCOUNTER — Ambulatory Visit (INDEPENDENT_AMBULATORY_CARE_PROVIDER_SITE_OTHER): Payer: 59 | Admitting: Physician Assistant

## 2020-11-10 VITALS — BP 110/62 | HR 66 | Temp 97.7°F | Ht 67.0 in | Wt 159.2 lb

## 2020-11-10 DIAGNOSIS — S42021D Displaced fracture of shaft of right clavicle, subsequent encounter for fracture with routine healing: Secondary | ICD-10-CM | POA: Diagnosis not present

## 2020-11-10 NOTE — Patient Instructions (Signed)
It was great to see you!  Please use the tegaderm to keep the area covered.  Continue to monitor the area, if concerns, let me know.  If you have any questions, please let me know.  Take care,  Inda Coke PA-C
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
## 2020-11-17 ENCOUNTER — Encounter: Payer: Self-pay | Admitting: Urology

## 2020-11-17 NOTE — Progress Notes (Signed)
  Radiation Oncology         [phone] ________________________________  Name: Lavern Langlitz MRN: [id number]  Date: 10/05/2020  DOB: April 01, 1950  End of Treatment Note  Diagnosis:   70 y.o. gentleman with a biochemical recurrence of prostate cancer with a PSA of 0.67 s/p RALP in 12/2009 for Stage pT2a N0, Gleason 3+3 prostate cancer.     Indication for treatment:  Curative, salvage Prostatic Fossa Radiotherapy       Radiation treatment dates:   08/10/20 - 10/05/20  Site/dose:   The prostatic fossa was treated to 68.4 Gy in 38 fractions of 1.8 Gy  Beams/energy:   The prostatic fossa was treated using VMAT intensity modulated radiotherapy delivering 6 megavolt photons. Image guidance was performed with CB-CT studies prior to each fraction. He was immobilized with a body fix lower extremity mold.  Narrative: The patient tolerated radiation treatment relatively well with only minor urinary issues.  He did notice increased frequency, urgency, occasional mild dysuria as well as some diarrhea that improved with Imodium as needed.  Plan: The patient has completed radiation treatment. He will return to radiation oncology clinic for routine followup in one month. I advised him to call or return sooner if he has any questions or concerns related to his recovery or treatment. ________________________________  Sheral Apley. Tammi Klippel, M.D.
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
## 2020-11-18 ENCOUNTER — Ambulatory Visit
Admission: RE | Admit: 2020-11-18 | Discharge: 2020-11-18 | Disposition: A | Discharge status: Home / Self Care | Payer: 59 | Source: Ambulatory Visit | Attending: Urology | Admitting: Urology

## 2020-11-18 ENCOUNTER — Other Ambulatory Visit: Payer: Self-pay | Admitting: Urology

## 2020-11-18 DIAGNOSIS — C61 Malignant neoplasm of prostate: Secondary | ICD-10-CM

## 2020-11-18 DIAGNOSIS — R9721 Rising PSA following treatment for malignant neoplasm of prostate: Secondary | ICD-10-CM
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
## 2020-11-30 ENCOUNTER — Encounter (HOSPITAL_COMMUNITY): Payer: 59 | Admitting: Internal Medicine

## 2020-12-07 ENCOUNTER — Encounter: Payer: Self-pay | Admitting: Physician Assistant

## 2020-12-07 ENCOUNTER — Other Ambulatory Visit: Payer: Self-pay

## 2020-12-07 ENCOUNTER — Ambulatory Visit (INDEPENDENT_AMBULATORY_CARE_PROVIDER_SITE_OTHER): Payer: 59 | Admitting: Physician Assistant

## 2020-12-07 ENCOUNTER — Ambulatory Visit (INDEPENDENT_AMBULATORY_CARE_PROVIDER_SITE_OTHER)
Admission: RE | Admit: 2020-12-07 | Discharge: 2020-12-07 | Disposition: A | Discharge status: Home / Self Care | Payer: 59 | Source: Ambulatory Visit | Attending: Physician Assistant | Admitting: Physician Assistant

## 2020-12-07 VITALS — BP 120/68 | HR 67 | Temp 98.0°F | Ht 67.0 in | Wt 161.0 lb

## 2020-12-07 DIAGNOSIS — M25551 Pain in right hip: Secondary | ICD-10-CM

## 2020-12-07 NOTE — Patient Instructions (Addendum)
It was great to see you!  An order for an xray has been put in for you. To get your xray, you can walk in at the [facility] location without a scheduled appointment.  The address is [address]. It is across the street from [place] is located in the basement.  Hours of operation are M-F 8:30am to 5:00pm. Please note that they are closed for lunch between 12:30 and 1:00pm.   I will be in touch with your results and the plan  Take care,  Inda Coke PA-C
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
## 2020-12-08 ENCOUNTER — Ambulatory Visit (INDEPENDENT_AMBULATORY_CARE_PROVIDER_SITE_OTHER): Payer: 59

## 2020-12-08 DIAGNOSIS — I429 Cardiomyopathy, unspecified: Secondary | ICD-10-CM | POA: Diagnosis not present

## 2020-12-09 LAB — CUP PACEART REMOTE DEVICE CHECK
Battery Remaining Percentage: 96 %
Date Time Interrogation Session: 20221005193900
Implantable Lead Implant Date: 20170727
Implantable Lead Location: 753862
Implantable Lead Model: 3401
Implantable Pulse Generator Implant Date: 20220404
Pulse Gen Serial Number: 156707

## 2020-12-13 ENCOUNTER — Other Ambulatory Visit: Payer: Self-pay | Admitting: Family Medicine

## 2020-12-13 DIAGNOSIS — G8929 Other chronic pain: Secondary | ICD-10-CM

## 2020-12-16 NOTE — Progress Notes (Signed)
Remote ICD transmission.   

## 2020-12-21 ENCOUNTER — Encounter (HOSPITAL_COMMUNITY): Payer: 59 | Admitting: Internal Medicine

## 2020-12-31 ENCOUNTER — Telehealth: Payer: Self-pay | Admitting: Physician Assistant

## 2020-12-31 DIAGNOSIS — M25551 Pain in right hip: Secondary | ICD-10-CM

## 2020-12-31 NOTE — Telephone Encounter (Signed)
Patient advised that part of his plan was to have physical therapy for his right side.  Need a referral .  Thanks

## 2020-12-31 NOTE — Telephone Encounter (Signed)
Spoke to pt told him I placed the referral for PT for his right hip and someone will contact you to schedule an appt. Pt verbalized understanding. Referral placed in Epic.
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
## 2021-01-03 ENCOUNTER — Other Ambulatory Visit (HOSPITAL_COMMUNITY): Payer: Self-pay

## 2021-01-03 ENCOUNTER — Ambulatory Visit (HOSPITAL_COMMUNITY)
Admission: RE | Admit: 2021-01-03 | Discharge: 2021-01-03 | Disposition: A | Discharge status: Home / Self Care | Payer: 59 | Source: Ambulatory Visit | Attending: Internal Medicine | Admitting: Internal Medicine

## 2021-01-03 ENCOUNTER — Other Ambulatory Visit: Payer: Self-pay

## 2021-01-03 ENCOUNTER — Encounter (HOSPITAL_COMMUNITY): Payer: Self-pay | Admitting: Internal Medicine

## 2021-01-03 VITALS — BP 140/80 | HR 61 | Wt 163.6 lb

## 2021-01-03 DIAGNOSIS — I110 Hypertensive heart disease with heart failure: Secondary | ICD-10-CM | POA: Insufficient documentation

## 2021-01-03 DIAGNOSIS — I2510 Atherosclerotic heart disease of native coronary artery without angina pectoris: Secondary | ICD-10-CM | POA: Diagnosis not present

## 2021-01-03 DIAGNOSIS — Z7982 Long term (current) use of aspirin: Secondary | ICD-10-CM | POA: Diagnosis not present

## 2021-01-03 DIAGNOSIS — Z8674 Personal history of sudden cardiac arrest: Secondary | ICD-10-CM | POA: Insufficient documentation

## 2021-01-03 DIAGNOSIS — Z8249 Family history of ischemic heart disease and other diseases of the circulatory system: Secondary | ICD-10-CM | POA: Diagnosis not present

## 2021-01-03 DIAGNOSIS — Z880 Allergy status to penicillin: Secondary | ICD-10-CM | POA: Insufficient documentation

## 2021-01-03 DIAGNOSIS — Z955 Presence of coronary angioplasty implant and graft: Secondary | ICD-10-CM | POA: Diagnosis not present

## 2021-01-03 DIAGNOSIS — Z95810 Presence of automatic (implantable) cardiac defibrillator: Secondary | ICD-10-CM | POA: Diagnosis not present

## 2021-01-03 DIAGNOSIS — I252 Old myocardial infarction: Secondary | ICD-10-CM | POA: Insufficient documentation

## 2021-01-03 DIAGNOSIS — Z888 Allergy status to other drugs, medicaments and biological substances status: Secondary | ICD-10-CM | POA: Diagnosis not present

## 2021-01-03 DIAGNOSIS — I5022 Chronic systolic (congestive) heart failure: Secondary | ICD-10-CM

## 2021-01-03 MED ORDER — FUROSEMIDE 20 MG PO TABS: 20.0000 mg | ORAL_TABLET | Freq: Every day | ORAL | 3 refills | Status: DC | PRN

## 2021-01-03 MED ORDER — POTASSIUM CHLORIDE CRYS ER 20 MEQ PO TBCR: EXTENDED_RELEASE_TABLET | ORAL | 3 refills | Status: DC

## 2021-01-03 MED ORDER — EMPAGLIFLOZIN 10 MG PO TABS: 10.0000 mg | ORAL_TABLET | Freq: Every day | ORAL | 3 refills | Status: DC

## 2021-01-03 NOTE — Progress Notes (Signed)
ReDS Vest / Clip - 01/03/21 1200       ReDS Vest / Clip   Station Marker C    Ruler Value 31    ReDS Value Range Low volume    ReDS Actual Value 35

## 2021-01-03 NOTE — Patient Instructions (Signed)
EKG done today.  Reds Clip done today.   No Labs done today.   START Jardiance 10mg  (1 tablet) by mouth daily.   START Lasix 20mg  (1 tablet) by mouth daily as needed for swelling or a weight gain of 3 pounds or more in 24 hours.   START Potassium 92meq(1 tablet) by mouth only on the days you take the Lasix.   No other medication changes were made. Please continue all current medications as prescribed.  Your physician recommends that you schedule a follow-up appointment in: 9 months. Please contact our office in June 2023 to schedule a July 2023 appointment.   If you have any questions or concerns before your next appointment please send [place] a message through [place] or call our office at [phone].    TO LEAVE A MESSAGE FOR THE NURSE SELECT OPTION 2, PLEASE LEAVE A MESSAGE INCLUDING: YOUR NAME DATE OF BIRTH CALL BACK NUMBER REASON FOR CALL**this is important as we prioritize the call backs  YOU WILL RECEIVE A CALL BACK THE SAME DAY AS LONG AS YOU CALL BEFORE 4:00 PM   Do the following things EVERYDAY: Weigh yourself in the morning before breakfast. Write it down and keep it in a log. Take your medicines as prescribed Eat low salt foods--Limit salt (sodium) to 2000 mg per day.  Stay as active as you can everyday Limit all fluids for the day to less than 2 liters   At the [facility], you and your health needs are our priority. As part of our continuing mission to provide you with exceptional heart care, we have created designated Provider Care Teams. These Care Teams include your primary Cardiologist (physician) and Advanced Practice Providers (APPs- Physician Assistants and Nurse Practitioners) who all work together to provide you with the care you need, when you need it.   You may see any of the following providers on your designated Care Team at your next follow up: Dr Glori Bickers Dr Haynes Kerns, NP Lyda Jester, [place] Audry Riles,  PharmD   Please be sure to bring in all your medications bottles to every appointment.

## 2021-01-04 ENCOUNTER — Ambulatory Visit (INDEPENDENT_AMBULATORY_CARE_PROVIDER_SITE_OTHER): Payer: 59 | Admitting: Physical Therapy

## 2021-01-04 DIAGNOSIS — M25551 Pain in right hip: Secondary | ICD-10-CM | POA: Diagnosis not present

## 2021-01-09 ENCOUNTER — Encounter: Payer: Self-pay | Admitting: Physical Therapy

## 2021-01-09 NOTE — Patient Instructions (Signed)
Access Code: [email or web address] URL: [email or web address] Date: 01/09/2021 Prepared by: Lyndee Hensen  Exercises Modified Marcello Moores Stretch - 2 x daily - 2 sets - 3 reps - 30 hold Half Kneeling Hip Flexor Stretch - 2 x daily - 3 reps - 30 hold Bent Knee Fallouts - 12 x daily - 5 reps - 10 hold Supine Bridge - 1 x daily - 2 sets - 10 reps Supine Hip Adduction Isometric with Ball - 1 x daily - 2 sets - 10 reps
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
## 2021-01-10 ENCOUNTER — Encounter: Payer: Self-pay | Admitting: Physical Therapy

## 2021-01-10 ENCOUNTER — Other Ambulatory Visit: Payer: Self-pay

## 2021-01-10 ENCOUNTER — Ambulatory Visit (INDEPENDENT_AMBULATORY_CARE_PROVIDER_SITE_OTHER): Payer: 59 | Admitting: Physical Therapy

## 2021-01-10 DIAGNOSIS — M25551 Pain in right hip: Secondary | ICD-10-CM

## 2021-01-11 ENCOUNTER — Encounter: Payer: Self-pay | Admitting: Physical Therapy

## 2021-01-11 NOTE — Therapy (Signed)
[facility] [address] [place], [place], [address] Phone: [phone]   Fax:  [phone]  Physical Therapy Treatment  Patient Details  Name: Kurt Reilly MRN: [id number] Date of Birth: February 18, 1951 Referring Provider (PT): Inda Coke   Encounter Date: 01/10/2021   PT End of Session - 01/11/21 2011     Visit Number 2    Number of Visits 12    Date for PT Re-Evaluation 02/15/21    Authorization Type UHC    PT Start Time 3710    PT Stop Time 0938    PT Time Calculation (min) 51 min    Activity Tolerance Patient tolerated treatment well    Behavior During Therapy [facility] for tasks assessed/performed             Past Medical History:  Diagnosis Date   Actinic keratosis    Alkaline phosphatase raised    Anemia    Cardiac arrhythmia    Cardiomyopathy ([place])    CHF (congestive heart failure) ([place])    Coronary arteriosclerosis    ED (erectile dysfunction)    Gout 03/09/2017   Hemangioma 08/24/2018   Hyperlipidemia    Hypertension    Hypertrophic scar    Malignant neoplasm of prostate ([place]) 2013   Was told that less than 1% of cancer   Melanocytic nevi of trunk    Melanocytic nevus of skin    Senile hyperkeratosis    STEMI (ST elevation myocardial infarction) ([place]) 2016    Past Surgical History:  Procedure Laterality Date   ICD IMPLANT     PROSTATECTOMY  2013   SUBQ ICD CHANGEOUT N/A 06/07/2020   Procedure: SUBQ ICD CHANGEOUT;  Surgeon: Evans Lance, MD;  Location: [facility];  Service: Cardiovascular;  Laterality: N/A;    There were no vitals filed for this visit.   Subjective Assessment - 01/11/21 2010     Subjective Pt states continued pain in R hip, increased pain with standing, walking, and initial standing. He does states less pain than last week. Has been doing HEP.    Currently in Pain? Yes    Pain Score 6     Pain Location Hip    Pain Orientation Right    Pain Descriptors / Indicators Aching     Pain Type Acute pain    Pain Onset More than a month ago    Pain Frequency Intermittent    Aggravating Factors  standing, walking, activity                               [facility] Adult PT Treatment/Exercise - 01/11/21 0001       Exercises   Exercises Knee/Hip      Knee/Hip Exercises: Stretches   Hip Flexor Stretch 3 reps;30 seconds    Hip Flexor Stretch Limitations standing and kneeling    Other Knee/Hip Stretches supine hip ER butterfly 10 sec x 5;      Knee/Hip Exercises: Aerobic   Recumbent Bike L1 x 5 min;      Knee/Hip Exercises: Seated   Sit to Sand 10 reps      Knee/Hip Exercises: Supine   Hip Adduction Isometric 10 reps    Bridges 20 reps    Other Supine Knee/Hip Exercises Clams BlTB x 15;      Modalities   Modalities Moist Heat      Moist Heat Therapy   Moist Heat Location Hip  R proximal adductor     Manual Therapy   Manual Therapy Soft tissue mobilization;Joint mobilization;Manual Traction    Joint Mobilization HIp inf mobs with strap gr 3;    Soft tissue mobilization TPR to R proximal adductor    Manual Traction LAD R hip                     PT Education - 01/11/21 2010     Education Details reviewed and updated HEP    Person(s) Educated Patient    Methods Explanation;Demonstration;Verbal cues;Tactile cues;Handout    Comprehension Returned demonstration;Verbalized understanding;Verbal cues required;Tactile cues required;Need further instruction              PT Short Term Goals - 01/09/21 0723       PT SHORT TERM GOAL #1   Title Pt to be independent with initial HEP    Time 2    Period Weeks    Status New    Target Date 01/18/21               PT Long Term Goals - 01/09/21 0724       PT LONG TERM GOAL #1   Title Pt to be independent with final HEP    Time 6    Period Weeks    Status New    Target Date 02/15/21      PT LONG TERM GOAL #2   Title Pt to report decreased pain in R hip to 0-2/10 with  activity    Period Weeks    Status New    Target Date 02/15/21      PT LONG TERM GOAL #3   Title Pt to demo ability for standing/walking for at least 30 min without pain in R hip, to improve ability for community activiy and IADLS.    Time 6    Period Weeks    Status New    Target Date 02/15/21                   Plan - 01/11/21 2012     Clinical Impression Statement Pt with much tenderness in proximal adductor today. Pt with improved pain after session today, with manual adductor release, hip joint mobilizationther ex and heat. Plan to continue manual and pain relief as needed, and progress light strengthening as able.    Personal Factors and Comorbidities Comorbidity 1    Comorbidities recent prostate CA with radiation, MI, CHF, Diffibrulator,    Examination-Activity Limitations Stand;Stairs;Squat;Transfers    Examination-Participation Restrictions Yard Work;Cleaning;Community Activity;Shop    Stability/Clinical Decision Making Stable/Uncomplicated    Rehab Potential Good    PT Frequency 2x / week    PT Duration 6 weeks    PT Treatment/Interventions ADLs/Self Care Home Management;Cryotherapy;Traction;Moist Heat;Gait training;Stair training;Functional mobility training;Therapeutic activities;Therapeutic exercise;Balance training;Patient/family education;Neuromuscular re-education;Manual techniques;Passive range of motion;Dry needling;Taping;Vasopneumatic Device;Joint Manipulations;Spinal Manipulations;DME Instruction    PT Home Exercise Plan [id number]    Consulted and Agree with Plan of Care Patient             Patient will benefit from skilled therapeutic intervention in order to improve the following deficits and impairments:  Abnormal gait, Pain, Decreased mobility, Increased muscle spasms, Decreased activity tolerance, Decreased range of motion, Decreased strength  Visit Diagnosis: Right hip pain     Problem List Patient Active Problem List   Diagnosis Date  Noted   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[place]) 07/13/2020   Cardiac arrest with ventricular fibrillation ([place])  05/13/2019   Hypertrophic scar    Hyperglycemia 11/20/2016   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   Cardiomyopathy ([place]) 08/25/2015   Hyperlipidemia, unspecified 65/53/7482   Acute systolic CHF (congestive heart failure) ([place]) 08/25/2015   CAD (coronary artery disease) 08/25/2015   Anemia, unspecified 02/14/2015   Essential (primary) hypertension 09/14/2014   S/P primary angioplasty with coronary stent 09/14/2014   Personal history of sudden cardiac arrest 09/10/2014   Gout 04/16/2013   Vitamin D deficiency 02/10/2011   History of prostate cancer 11/12/2009    Lyndee Hensen, PT, DPT 8:14 PM  01/11/21    [facility] [place], [place], [address] Phone: [phone]   Fax:  [phone]  Name: Kurt Reilly MRN: [id number] Date of Birth: 14-Jul-1950

## 2021-01-12 ENCOUNTER — Other Ambulatory Visit: Payer: Self-pay

## 2021-01-12 ENCOUNTER — Ambulatory Visit (INDEPENDENT_AMBULATORY_CARE_PROVIDER_SITE_OTHER): Payer: 59 | Admitting: Physical Therapy

## 2021-01-12 ENCOUNTER — Encounter: Payer: Self-pay | Admitting: Physical Therapy

## 2021-01-12 DIAGNOSIS — M25551 Pain in right hip: Secondary | ICD-10-CM | POA: Diagnosis not present

## 2021-01-12 NOTE — Therapy (Signed)
[facility] [address] [place], [place], [address] Phone: [phone]   Fax:  [phone]  Physical Therapy Treatment  Patient Details  Name: Kurt Reilly MRN: [id number] Date of Birth: [birth year] Referring Provider (PT): Inda Coke   Encounter Date: 01/12/2021   PT End of Session - 01/12/21 2106     Visit Number 3    Number of Visits 12    Date for PT Re-Evaluation 02/15/21    Authorization Type UHC    PT Start Time 1215    PT Stop Time 4970    PT Time Calculation (min) 53 min    Activity Tolerance Patient tolerated treatment well    Behavior During Therapy [facility] for tasks assessed/performed             Past Medical History:  Diagnosis Date   Actinic keratosis    Alkaline phosphatase raised    Anemia    Cardiac arrhythmia    Cardiomyopathy ([place])    CHF (congestive heart failure) ([place])    Coronary arteriosclerosis    ED (erectile dysfunction)    Gout 03/09/2017   Hemangioma 08/24/2018   Hyperlipidemia    Hypertension    Hypertrophic scar    Malignant neoplasm of prostate ([place]) 2013   Was told that less than 1% of cancer   Melanocytic nevi of trunk    Melanocytic nevus of skin    Senile hyperkeratosis    STEMI (ST elevation myocardial infarction) ([place]) 2016    Past Surgical History:  Procedure Laterality Date   ICD IMPLANT     PROSTATECTOMY  2013   SUBQ ICD CHANGEOUT N/A 06/07/2020   Procedure: SUBQ ICD CHANGEOUT;  Surgeon: Evans Lance, MD;  Location: [facility];  Service: Cardiovascular;  Laterality: N/A;    There were no vitals filed for this visit.   Subjective Assessment - 01/12/21 2106     Subjective Pt states less pain in hip. Standing/walking still increasing pain    Patient Stated Goals decreased pain    Currently in Pain? Yes    Pain Score 4     Pain Location Hip    Pain Orientation Right    Pain Descriptors / Indicators Aching    Pain Type Acute pain    Pain Onset More than a  month ago    Pain Frequency Intermittent                               [facility] Adult PT Treatment/Exercise - 01/12/21 0001       Exercises   Exercises Knee/Hip      Knee/Hip Exercises: Stretches   Hip Flexor Stretch 3 reps;30 seconds    Hip Flexor Stretch Limitations standing    Other Knee/Hip Stretches supine hip ER butterfly 10 sec x 5;      Knee/Hip Exercises: Aerobic   Recumbent Bike L1 x 6  min;      Knee/Hip Exercises: Standing   Forward Step Up 10 reps;1 set;Hand Hold: 1;Step Height: 6"      Knee/Hip Exercises: Seated   Sit to Sand --      Knee/Hip Exercises: Supine   Bridges 20 reps    Straight Leg Raises 10 reps;Right    Other Supine Knee/Hip Exercises Clams BlTB x 20;      Modalities   Modalities Moist Heat      Moist Heat Therapy   Number Minutes Moist Heat  10 Minutes    Moist Heat Location Hip   R proximal adductor     Manual Therapy   Manual Therapy Soft tissue mobilization;Joint mobilization;Manual Traction    Joint Mobilization HIp inf  and post mobs  gr 3;    Soft tissue mobilization TPR to R proximal adductor    Manual Traction LAD R hip                       PT Short Term Goals - 01/09/21 0723       PT SHORT TERM GOAL #1   Title Pt to be independent with initial HEP    Time 2    Period Weeks    Status New    Target Date 01/18/21               PT Long Term Goals - 01/09/21 0724       PT LONG TERM GOAL #1   Title Pt to be independent with final HEP    Time 6    Period Weeks    Status New    Target Date 02/15/21      PT LONG TERM GOAL #2   Title Pt to report decreased pain in R hip to 0-2/10 with activity    Period Weeks    Status New    Target Date 02/15/21      PT LONG TERM GOAL #3   Title Pt to demo ability for standing/walking for at least 30 min without pain in R hip, to improve ability for community activiy and IADLS.    Time 6    Period Weeks    Status New    Target Date 02/15/21                    Plan - 01/12/21 2110     Clinical Impression Statement Pt with tenderness at proximal adductor, but less pain than previous session. He is able to perform SLR today, but does have increased soreness. Pt to benefit from contiued manual tissue release for adductor, as well as strengthening for non -painful motions, abd and rotation.    Personal Factors and Comorbidities Comorbidity 1    Comorbidities recent prostate CA with radiation, MI, CHF, Diffibrulator,    Examination-Activity Limitations Stand;Stairs;Squat;Transfers    Examination-Participation Restrictions Yard Work;Cleaning;Community Activity;Shop    Stability/Clinical Decision Making Stable/Uncomplicated    Rehab Potential Good    PT Frequency 2x / week    PT Duration 6 weeks    PT Treatment/Interventions ADLs/Self Care Home Management;Cryotherapy;Traction;Moist Heat;Gait training;Stair training;Functional mobility training;Therapeutic activities;Therapeutic exercise;Balance training;Patient/family education;Neuromuscular re-education;Manual techniques;Passive range of motion;Dry needling;Taping;Vasopneumatic Device;Joint Manipulations;Spinal Manipulations;DME Instruction    PT Home Exercise Plan [id number]    Consulted and Agree with Plan of Care Patient             Patient will benefit from skilled therapeutic intervention in order to improve the following deficits and impairments:  Abnormal gait, Pain, Decreased mobility, Increased muscle spasms, Decreased activity tolerance, Decreased range of motion, Decreased strength  Visit Diagnosis: Right hip pain     Problem List Patient Active Problem List   Diagnosis Date Noted   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[place]) 07/13/2020   Cardiac arrest with ventricular fibrillation ([place]) 05/13/2019   Hypertrophic scar    Hyperglycemia 11/20/2016   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   Cardiomyopathy ([place])  08/25/2015   Hyperlipidemia, unspecified 79/39/0300   Acute systolic CHF (congestive  heart failure) ([place]) 08/25/2015   CAD (coronary artery disease) 08/25/2015   Anemia, unspecified 02/14/2015   Essential (primary) hypertension 09/14/2014   S/P primary angioplasty with coronary stent 09/14/2014   Personal history of sudden cardiac arrest 09/10/2014   Gout 04/16/2013   Vitamin D deficiency 02/10/2011   History of prostate cancer 11/12/2009    Lyndee Hensen, PT, DPT 9:12 PM  01/12/21   [facility] [address] [place], [place], [address] Phone: [phone]   Fax:  [phone]  Name: Kurt Reilly MRN: [id number] Date of Birth: 1950/07/18

## 2021-01-18 ENCOUNTER — Encounter: Payer: 59 | Admitting: Physical Therapy

## 2021-01-18 ENCOUNTER — Other Ambulatory Visit: Payer: Self-pay | Admitting: Interventional Cardiology

## 2021-01-20 ENCOUNTER — Encounter: Payer: Self-pay | Admitting: Physical Therapy

## 2021-01-20 ENCOUNTER — Other Ambulatory Visit: Payer: Self-pay

## 2021-01-20 ENCOUNTER — Ambulatory Visit (INDEPENDENT_AMBULATORY_CARE_PROVIDER_SITE_OTHER): Payer: 59 | Admitting: Physical Therapy

## 2021-01-20 DIAGNOSIS — M25551 Pain in right hip: Secondary | ICD-10-CM | POA: Diagnosis not present

## 2021-01-21 ENCOUNTER — Encounter: Payer: Self-pay | Admitting: Physical Therapy

## 2021-01-21 NOTE — Therapy (Signed)
[facility] [address] [place], [place], [address] Phone: [phone]   Fax:  [phone]  Physical Therapy Treatment  Patient Details  Name: Kurt Reilly MRN: [id number] Date of Birth: 09-May-1950 Referring Provider (PT): Inda Coke   Encounter Date: 01/20/2021   PT End of Session - 01/20/21 1314     Visit Number 4    Number of Visits 12    Date for PT Re-Evaluation 02/15/21    Authorization Type UHC    PT Start Time 6734    PT Stop Time 1937    PT Time Calculation (min) 40 min    Activity Tolerance Patient tolerated treatment well    Behavior During Therapy [facility] for tasks assessed/performed             Past Medical History:  Diagnosis Date   Actinic keratosis    Alkaline phosphatase raised    Anemia    Cardiac arrhythmia    Cardiomyopathy ([place])    CHF (congestive heart failure) ([place])    Coronary arteriosclerosis    ED (erectile dysfunction)    Gout 03/09/2017   Hemangioma 08/24/2018   Hyperlipidemia    Hypertension    Hypertrophic scar    Malignant neoplasm of prostate ([place]) 2013   Was told that less than 1% of cancer   Melanocytic nevi of trunk    Melanocytic nevus of skin    Senile hyperkeratosis    STEMI (ST elevation myocardial infarction) ([place]) 2016    Past Surgical History:  Procedure Laterality Date   ICD IMPLANT     PROSTATECTOMY  2013   SUBQ ICD CHANGEOUT N/A 06/07/2020   Procedure: SUBQ ICD CHANGEOUT;  Surgeon: Evans Lance, MD;  Location: [facility];  Service: Cardiovascular;  Laterality: N/A;    There were no vitals filed for this visit.   Subjective Assessment - 01/21/21 1351     Subjective Pt states improving pain, slightly sore anteriorly, and also in high hamstring region.    Patient Stated Goals decreased pain    Currently in Pain? Yes    Pain Score 2     Pain Location Hip    Pain Orientation Right    Pain Descriptors / Indicators Aching    Pain Type Acute pain     Pain Onset More than a month ago    Pain Frequency Intermittent                               [facility] Adult PT Treatment/Exercise - 01/21/21 0001       Exercises   Exercises Knee/Hip      Knee/Hip Exercises: Stretches   Active Hamstring Stretch 3 reps;30 seconds    Active Hamstring Stretch Limitations seated    Hip Flexor Stretch 3 reps;30 seconds    Hip Flexor Stretch Limitations standing    Other Knee/Hip Stretches hip adductor stretch, lateral lunge in standing 20 sec x 5;      Knee/Hip Exercises: Aerobic   Recumbent Bike L1 x 8  min;      Knee/Hip Exercises: Standing   Forward Step Up 10 reps;1 set;Hand Hold: 1;Step Height: 6"    Forward Step Up Limitations with opp march    Functional Squat 20 reps      Knee/Hip Exercises: Supine   Bridges 20 reps    Straight Leg Raises 10 reps;Right    Other Supine Knee/Hip Exercises reverse SLR with  GTB x 15 on R;      Modalities   Modalities Moist Heat      Moist Heat Therapy   Moist Heat Location Hip   R proximal adductor     Manual Therapy   Manual Therapy Soft tissue mobilization;Joint mobilization;Manual Traction    Joint Mobilization HIp inf  and post mobs  gr 3;    Soft tissue mobilization TPR to R proximal adductor    Manual Traction LAD R hip                       PT Short Term Goals - 01/09/21 0723       PT SHORT TERM GOAL #1   Title Pt to be independent with initial HEP    Time 2    Period Weeks    Status New    Target Date 01/18/21               PT Long Term Goals - 01/09/21 0724       PT LONG TERM GOAL #1   Title Pt to be independent with final HEP    Time 6    Period Weeks    Status New    Target Date 02/15/21      PT LONG TERM GOAL #2   Title Pt to report decreased pain in R hip to 0-2/10 with activity    Period Weeks    Status New    Target Date 02/15/21      PT LONG TERM GOAL #3   Title Pt to demo ability for standing/walking for at least 30 min  without pain in R hip, to improve ability for community activiy and IADLS.    Time 6    Period Weeks    Status New    Target Date 02/15/21                   Plan - 01/21/21 1352     Clinical Impression Statement Pt with improving pain. He has mild tenderness to palpate proximal adductor and proximal hamstring, but much improved pain with activity. Pt also with much improved gait mechanics due to improved pain. Pt progressing well, will benefit from continued care to meet LTGs.    Personal Factors and Comorbidities Comorbidity 1    Comorbidities recent prostate CA with radiation, MI, CHF, Diffibrulator,    Examination-Activity Limitations Stand;Stairs;Squat;Transfers    Examination-Participation Restrictions Yard Work;Cleaning;Community Activity;Shop    Stability/Clinical Decision Making Stable/Uncomplicated    Rehab Potential Good    PT Frequency 2x / week    PT Duration 6 weeks    PT Treatment/Interventions ADLs/Self Care Home Management;Cryotherapy;Traction;Moist Heat;Gait training;Stair training;Functional mobility training;Therapeutic activities;Therapeutic exercise;Balance training;Patient/family education;Neuromuscular re-education;Manual techniques;Passive range of motion;Dry needling;Taping;Vasopneumatic Device;Joint Manipulations;Spinal Manipulations;DME Instruction    PT Home Exercise Plan [id number]    Consulted and Agree with Plan of Care Patient             Patient will benefit from skilled therapeutic intervention in order to improve the following deficits and impairments:  Abnormal gait, Pain, Decreased mobility, Increased muscle spasms, Decreased activity tolerance, Decreased range of motion, Decreased strength  Visit Diagnosis: Right hip pain     Problem List Patient Active Problem List   Diagnosis Date Noted   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[place]) 07/13/2020   Cardiac arrest with ventricular fibrillation ([place]) 05/13/2019    Hypertrophic scar    Hyperglycemia 11/20/2016   ICD (implantable cardioverter-defibrillator)  in place 10/13/2015   Cardiomyopathy ([place]) 08/25/2015   Hyperlipidemia, unspecified 37/90/2409   Acute systolic CHF (congestive heart failure) ([place]) 08/25/2015   CAD (coronary artery disease) 08/25/2015   Anemia, unspecified 02/14/2015   Essential (primary) hypertension 09/14/2014   S/P primary angioplasty with coronary stent 09/14/2014   Personal history of sudden cardiac arrest 09/10/2014   Gout 04/16/2013   Vitamin D deficiency 02/10/2011   History of prostate cancer 11/12/2009   Lyndee Hensen, PT, DPT 2:10 PM  01/21/21    [facility] [place], [place], [address] Phone: [phone]   Fax:  [phone]  Name: Zacary Bauer MRN: [id number] Date of Birth: 03/16/1950

## 2021-01-24 ENCOUNTER — Ambulatory Visit (INDEPENDENT_AMBULATORY_CARE_PROVIDER_SITE_OTHER): Payer: 59 | Admitting: Physical Therapy

## 2021-01-24 ENCOUNTER — Other Ambulatory Visit: Payer: Self-pay

## 2021-01-24 ENCOUNTER — Telehealth: Payer: Self-pay

## 2021-01-24 ENCOUNTER — Encounter: Payer: Self-pay | Admitting: Physical Therapy

## 2021-01-24 DIAGNOSIS — M25551 Pain in right hip: Secondary | ICD-10-CM | POA: Diagnosis not present

## 2021-01-24 NOTE — Telephone Encounter (Signed)
Called and lmom pt that they needed to call and renew their grant for praluent.  If you're a patient or family member and you're ready to apply, you have two options: Apply online here. Apply by calling [phone], Monday-Friday, 9 am to 5 pm (EST)

## 2021-01-24 NOTE — Telephone Encounter (Signed)
-----   Message from Rollen Sox, [facility] sent at 01/21/2021  3:27 PM EST ----- Patient called and asked if [place] Silos could help him apply for copay card.  Advised he can do it by going to [email or web address] and filling out the information.  Please call patient next week to see if he was able to complete it

## 2021-01-24 NOTE — Therapy (Addendum)
[facility] [address] [place], [place], [address] Phone: [phone]   Fax:  [phone]  Physical Therapy Treatment  Patient Details  Name: Kurt Reilly MRN: [id number] Date of Birth: 1950-04-11 Referring Provider (PT): Inda Coke   Encounter Date: 01/24/2021   PT End of Session - 01/24/21 1400     Visit Number 4    Number of Visits 12    Date for PT Re-Evaluation 02/15/21    Authorization Type UHC    PT Start Time 1347    PT Stop Time 7414    PT Time Calculation (min) 38 min    Activity Tolerance Patient tolerated treatment well    Behavior During Therapy [facility] for tasks assessed/performed             Past Medical History:  Diagnosis Date   Actinic keratosis    Alkaline phosphatase raised    Anemia    Cardiac arrhythmia    Cardiomyopathy ([place])    CHF (congestive heart failure) ([place])    Coronary arteriosclerosis    ED (erectile dysfunction)    Gout 03/09/2017   Hemangioma 08/24/2018   Hyperlipidemia    Hypertension    Hypertrophic scar    Malignant neoplasm of prostate ([place]) 2013   Was told that less than 1% of cancer   Melanocytic nevi of trunk    Melanocytic nevus of skin    Senile hyperkeratosis    STEMI (ST elevation myocardial infarction) ([place]) 2016    Past Surgical History:  Procedure Laterality Date   ICD IMPLANT     PROSTATECTOMY  2013   SUBQ ICD CHANGEOUT N/A 06/07/2020   Procedure: SUBQ ICD CHANGEOUT;  Surgeon: Evans Lance, MD;  Location: [facility];  Service: Cardiovascular;  Laterality: N/A;    There were no vitals filed for this visit.   Subjective Assessment - 01/24/21 1356     Subjective Pt states increased pain in high hamstring area since last visit/last week, it is calming down now. Pain in front of hip doing better.    Patient Stated Goals decreased pain    Currently in Pain? Yes    Pain Score 2     Pain Location Hip    Pain Orientation Right    Pain Descriptors /  Indicators Aching    Pain Type Acute pain    Pain Onset More than a month ago    Pain Frequency Intermittent                               [facility] Adult PT Treatment/Exercise - 01/24/21 0001       Exercises   Exercises Knee/Hip      Knee/Hip Exercises: Stretches   Active Hamstring Stretch 3 reps;30 seconds    Active Hamstring Stretch Limitations seated    Hip Flexor Stretch --    Hip Flexor Stretch Limitations --    Other Knee/Hip Stretches --      Knee/Hip Exercises: Aerobic   Recumbent Bike L1 x 8  min;      Knee/Hip Exercises: Standing   Forward Step Up 10 reps;1 set;Hand Hold: 1;Step Height: 6"    Forward Step Up Limitations --    Functional Squat 20 reps    Other Standing Knee Exercises Marching x 20:  Dead lift motion x 15;    Other Standing Knee Exercises SLS 30 sec x 2 bil;  Knee/Hip Exercises: Seated   Other Seated Knee/Hip Exercises Nerve floss and glides x 10 ea;      Knee/Hip Exercises: Supine   Bridges 20 reps    Straight Leg Raises 10 reps;Right    Other Supine Knee/Hip Exercises --      Modalities   Modalities Moist Heat      Moist Heat Therapy   Moist Heat Location --      Manual Therapy   Manual Therapy Soft tissue mobilization;Joint mobilization;Manual Traction    Joint Mobilization HIp inf  and post mobs  gr 3;    Soft tissue mobilization --    Manual Traction LAD R hip                       PT Short Term Goals - 01/09/21 0723       PT SHORT TERM GOAL #1   Title Pt to be independent with initial HEP    Time 2    Period Weeks    Status New    Target Date 01/18/21               PT Long Term Goals - 01/09/21 0724       PT LONG TERM GOAL #1   Title Pt to be independent with final HEP    Time 6    Period Weeks    Status New    Target Date 02/15/21      PT LONG TERM GOAL #2   Title Pt to report decreased pain in R hip to 0-2/10 with activity    Period Weeks    Status New    Target Date  02/15/21      PT LONG TERM GOAL #3   Title Pt to demo ability for standing/walking for at least 30 min without pain in R hip, to improve ability for community activiy and IADLS.    Time 6    Period Weeks    Status New    Target Date 02/15/21                   Plan - 01/24/21 1542     Clinical Impression Statement Pt with soreness at hamstring insertion at ishial tuberosity today with palpation. He has no increased pain with activities done today. Discussed continuing light stretching for HS this week. Pain at adductor seems to be mostly resolved, but increased pain in hasmtring has been bothersome. Pain improved today from last couple days. Will continue to monitor this next week.    Personal Factors and Comorbidities Comorbidity 1    Comorbidities recent prostate CA with radiation, MI, CHF, Diffibrulator,    Examination-Activity Limitations Stand;Stairs;Squat;Transfers    Examination-Participation Restrictions Yard Work;Cleaning;Community Activity;Shop    Stability/Clinical Decision Making Stable/Uncomplicated    Rehab Potential Good    PT Frequency 2x / week    PT Duration 6 weeks    PT Treatment/Interventions ADLs/Self Care Home Management;Cryotherapy;Traction;Moist Heat;Gait training;Stair training;Functional mobility training;Therapeutic activities;Therapeutic exercise;Balance training;Patient/family education;Neuromuscular re-education;Manual techniques;Passive range of motion;Dry needling;Taping;Vasopneumatic Device;Joint Manipulations;Spinal Manipulations;DME Instruction    PT Home Exercise Plan [id number]    Consulted and Agree with Plan of Care Patient             Patient will benefit from skilled therapeutic intervention in order to improve the following deficits and impairments:  Abnormal gait, Pain, Decreased mobility, Increased muscle spasms, Decreased activity tolerance, Decreased range of motion, Decreased strength  Visit Diagnosis: Right hip  pain  Problem List Patient Active Problem List   Diagnosis Date Noted   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[place]) 07/13/2020   Cardiac arrest with ventricular fibrillation ([place]) 05/13/2019   Hypertrophic scar    Hyperglycemia 11/20/2016   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   Cardiomyopathy ([place]) 08/25/2015   Hyperlipidemia, unspecified 57/97/2820   Acute systolic CHF (congestive heart failure) ([place]) 08/25/2015   CAD (coronary artery disease) 08/25/2015   Anemia, unspecified 02/14/2015   Essential (primary) hypertension 09/14/2014   S/P primary angioplasty with coronary stent 09/14/2014   Personal history of sudden cardiac arrest 09/10/2014   Gout 04/16/2013   Vitamin D deficiency 02/10/2011   History of prostate cancer 11/12/2009   Lyndee Hensen, PT, DPT 3:44 PM  01/24/21    [facility] [address] [place], [place], [address] Phone: [phone]   Fax:  [phone]  Name: Caio Devera MRN: [id number] Date of Birth: 02/17/1951  PHYSICAL THERAPY DISCHARGE SUMMARY  Visits from Start of Care: 4 Plan: Patient agrees to discharge.  Patient goals were partially  met. Patient is being discharged due to - pt not returning since last visit .     Lyndee Hensen, PT, DPT 10:42 AM  03/30/22

## 2021-01-26 ENCOUNTER — Other Ambulatory Visit: Payer: Self-pay | Admitting: Interventional Cardiology

## 2021-02-03 ENCOUNTER — Ambulatory Visit: Payer: 59

## 2021-02-10 ENCOUNTER — Ambulatory Visit (INDEPENDENT_AMBULATORY_CARE_PROVIDER_SITE_OTHER): Payer: 59

## 2021-02-10 ENCOUNTER — Inpatient Hospital Stay: Payer: 59 | Admitting: *Deleted

## 2021-02-10 ENCOUNTER — Other Ambulatory Visit: Payer: Self-pay

## 2021-02-10 ENCOUNTER — Telehealth: Payer: Self-pay | Admitting: *Deleted

## 2021-02-10 DIAGNOSIS — Z23 Encounter for immunization: Secondary | ICD-10-CM | POA: Diagnosis not present

## 2021-02-21 ENCOUNTER — Telehealth: Payer: Self-pay | Admitting: *Deleted

## 2021-03-02 ENCOUNTER — Ambulatory Visit (HOSPITAL_COMMUNITY): Payer: 59 | Attending: Cardiology

## 2021-03-02 ENCOUNTER — Other Ambulatory Visit: Payer: Self-pay

## 2021-03-02 DIAGNOSIS — I5022 Chronic systolic (congestive) heart failure: Secondary | ICD-10-CM | POA: Insufficient documentation

## 2021-03-02 LAB — ECHOCARDIOGRAM COMPLETE
Area-P 1/2: 3.7 cm2
MV M vel: 5.37 m/s
MV Peak grad: 115.3 mmHg
S' Lateral: 4.15 cm

## 2021-03-03 ENCOUNTER — Telehealth: Payer: Self-pay | Admitting: Interventional Cardiology

## 2021-03-03 NOTE — Telephone Encounter (Signed)
Patient states he had an echo yesterday and would like to know the results.

## 2021-03-03 NOTE — Telephone Encounter (Signed)
Spoke with wife and made her aware that I am waiting on Dr. Tamala Julian to review echo and would be in contact with results as soon as he did.  Wife appreciative for call.

## 2021-03-08 ENCOUNTER — Encounter: Payer: Self-pay | Admitting: Interventional Cardiology

## 2021-03-09 ENCOUNTER — Ambulatory Visit (INDEPENDENT_AMBULATORY_CARE_PROVIDER_SITE_OTHER): Payer: 59

## 2021-03-09 DIAGNOSIS — I429 Cardiomyopathy, unspecified: Secondary | ICD-10-CM | POA: Diagnosis not present

## 2021-03-10 LAB — CUP PACEART REMOTE DEVICE CHECK
Battery Remaining Percentage: 93 %
Date Time Interrogation Session: 20230104184100
Implantable Lead Implant Date: 20170727
Implantable Lead Location: 753862
Implantable Lead Model: 3401
Implantable Pulse Generator Implant Date: 20220404
Pulse Gen Serial Number: 156707

## 2021-03-11 ENCOUNTER — Telehealth: Payer: Self-pay | Admitting: *Deleted

## 2021-03-11 ENCOUNTER — Ambulatory Visit: Payer: 59 | Admitting: Interventional Cardiology

## 2021-03-11 ENCOUNTER — Other Ambulatory Visit: Payer: Self-pay | Admitting: Physician Assistant

## 2021-03-11 ENCOUNTER — Other Ambulatory Visit: Payer: Self-pay

## 2021-03-11 ENCOUNTER — Encounter: Payer: Self-pay | Admitting: Interventional Cardiology

## 2021-03-11 VITALS — BP 98/62 | HR 67 | Ht 67.0 in | Wt 161.0 lb

## 2021-03-11 DIAGNOSIS — Z95810 Presence of automatic (implantable) cardiac defibrillator: Secondary | ICD-10-CM | POA: Diagnosis not present

## 2021-03-11 DIAGNOSIS — I10 Essential (primary) hypertension: Secondary | ICD-10-CM

## 2021-03-11 DIAGNOSIS — I2510 Atherosclerotic heart disease of native coronary artery without angina pectoris: Secondary | ICD-10-CM

## 2021-03-11 DIAGNOSIS — I5022 Chronic systolic (congestive) heart failure: Secondary | ICD-10-CM | POA: Diagnosis not present

## 2021-03-11 DIAGNOSIS — E785 Hyperlipidemia, unspecified: Secondary | ICD-10-CM

## 2021-03-11 DIAGNOSIS — G8929 Other chronic pain: Secondary | ICD-10-CM

## 2021-03-11 NOTE — Progress Notes (Signed)
Cardiology Office Note:    Date:  03/11/2021   ID:  Kurt Reilly, DOB 10-17-1950, MRN [id number]  PCP:  Kurt Coke, PA  Cardiologist:  Kurt Grooms, MD   Referring MD: Kurt Reilly, [place]   No chief complaint on file.   History of Present Illness:    Kurt Reilly is a 71 y.o. male with a hx of  CAD with anterior MI/cardiac arrest in May 2016 with 100% LAD with PCI - had associated cardiogenic shock/failure to wean - then had staged PCI to LCX and ostial LAD in June 2016 (total of 2 stents to the LAD) [facility], chronic systolic HF, ICM, underlying ICD (subcutaneous 2017), HLD, HTN and ED. EF was less than 35% -->improved to 40 to 45% --> most recent EF 25% (07/2019)  I referred him to advanced heart failure earlier this year for touchup and comanagement.  Patient now being treated with SGLT2 therapy, Lasix, potassium.  He is doing well.  He has seen Dr. Haroldine Reilly.  He was encouraged to start SGLT2 therapy and has begun empagliflozin.  He is tolerating it well.  He notes that blood pressure is slightly lower.  States that he urinates a lot.  He has not had syncope.  Last year he did have a fall with a broken clavicle.  He also had COVID.  Past Medical History:  Diagnosis Date   Actinic keratosis    Alkaline phosphatase raised    Anemia    Cardiac arrhythmia    Cardiomyopathy [facility])    CHF (congestive heart failure) ([facility])    Coronary arteriosclerosis    ED (erectile dysfunction)    Gout 03/09/2017   Hemangioma 08/24/2018   Hyperlipidemia    Hypertension    Hypertrophic scar    Malignant neoplasm of prostate ([place]) 2013   Was told that less than 1% of cancer   Melanocytic nevi of trunk    Melanocytic nevus of skin    Senile hyperkeratosis    STEMI (ST elevation myocardial infarction) ([place]) 2016    Past Surgical History:  Procedure Laterality Date   ICD IMPLANT     PROSTATECTOMY  2013   SUBQ ICD CHANGEOUT N/A 06/07/2020   Procedure: SUBQ ICD CHANGEOUT;  Surgeon:  Kurt Lance, MD;  Location: [facility];  Service: Cardiovascular;  Laterality: N/A;    Current Medications: No outpatient medications have been marked as taking for the 03/11/21 encounter (Appointment) with Belva Crome, MD.     Allergies:   Ezetimibe, Repatha [evolocumab], Statins, and Penicillins   Social History   Socioeconomic History   Marital status: Married    Spouse name: Kurt Reilly   Number of children: 3   Years of education: Not on file   Highest education level: Not on file  Occupational History    Comment: retired  Tobacco Use   Smoking status: Never   Smokeless tobacco: Never  Vaping Use   Vaping Use: Never used  Substance and Sexual Activity   Alcohol use: Yes    Comment: 2 glasses of wine per week   Drug use: Never   Sexual activity: Yes  Other Topics Concern   Not on file  Social History Narrative   Moved to [place] in Oct 2020 to be closer to grandchildren   Retired IT sales professional, Clinical biochemist, Training and development officer   Married   3 sons   Social Determinants of Radio broadcast assistant Strain: Not on [employer] Insecurity: Not on  file  Transportation Needs: Not on file  Physical Activity: Not on file  Stress: Not on file  Social Connections: Not on file     Family History: The patient's family history includes Bladder Cancer in his brother; High Cholesterol in his father; Hypertension in his father; Migraines in his son; Stomach cancer in his father; Yves Dill Parkinson White syndrome in his son. There is no history of Prostate cancer, Breast cancer, Colon cancer, or Pancreatic cancer.  ROS:   Please see the history of present illness.    Denies PND.  All other systems reviewed and are negative.  EKGs/Labs/Other Studies Reviewed:    The following studies were reviewed today:  Echocardiogram December 2022: IMPRESSIONS   1. 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 The LVEF slightly improved, was 25-30%  now 30-35%. Left  ventricular ejection fraction, by estimation, is 30 to 35%. The left  ventricle has moderately decreased function.  The left ventricle has no regional wall motion abnormalities. Left  ventricular diastolic parameters are consistent with Grade I diastolic  dysfunction (impaired relaxation).   2. Right ventricular systolic function is normal. The right ventricular  size is normal. Tricuspid regurgitation signal is inadequate for assessing  PA pressure.   3. Left atrial size was mildly dilated.   4. The mitral valve is normal in structure. Mild mitral valve  regurgitation. No evidence of mitral stenosis.   5. The aortic valve is tricuspid. Aortic valve regurgitation is not  visualized. Aortic valve sclerosis is present, with no evidence of aortic  valve stenosis.   6. The inferior vena cava is normal in size with greater than 50%  respiratory variability, suggesting right atrial pressure of 3 mmHg.   EKG:  EKG not performed  Recent Labs: 05/18/2020: Hemoglobin 13.9; Platelets 250 09/22/2020: ALT 17; BUN 23; Creatinine, Ser 1.12; NT-Pro BNP 494; Potassium 4.6; Sodium 139; TSH 1.130  Recent Lipid Panel    Component Value Date/Time   CHOL 120 09/22/2020 1213   TRIG 198 (H) 09/22/2020 1213   HDL 40 09/22/2020 1213   CHOLHDL 3.0 09/22/2020 1213   LDLCALC 48 09/22/2020 1213    Physical Exam:    VS:  There were no vitals taken for this visit.    Wt Readings from Last 3 Encounters:  01/03/21 163 lb 9.6 oz (74.2 kg)  12/07/20 161 lb (73 kg)  11/10/20 159 lb 3.2 oz (72.2 kg)     GEN: Healthy. No acute distress HEENT: Normal NECK: No JVD. LYMPHATICS: No lymphadenopathy CARDIAC: No murmur. RRR no gallop, or edema. VASCULAR:  Normal Pulses. No bruits. RESPIRATORY:  Clear to auscultation without rales, wheezing or rhonchi  ABDOMEN: Soft, non-tender, non-distended, No pulsatile mass, MUSCULOSKELETAL: No deformity  SKIN: Warm and dry NEUROLOGIC:  Alert and oriented x  3 PSYCHIATRIC:  Normal affect   ASSESSMENT:    1. Chronic systolic heart failure ([place])   2. Coronary artery disease involving native coronary artery of native heart without angina pectoris   3. ICD (implantable cardioverter-defibrillator) in place   4. Essential hypertension   5. Hyperlipidemia LDL goal <70    PLAN:    In order of problems listed above:  Currently on quadruple therapy.  Relatively low blood pressure but not causing symptoms.  Check basic metabolic panel.  He is now 7 years postinfarction.  Despite the read on the most current echo that there is a dyskinetic apex, no clot was seen and therefore I  do not believe ongoing anticoagulation is required. Secondary prevention reviewed Followed in device clinic Consider discontinuing furosemide. Continue Praluent.  Overall education and awareness concerning primary/secondary risk prevention was discussed in detail: LDL less than 70, hemoglobin A1c less than 7, blood pressure target less than 130/80 mmHg, >150 minutes of moderate aerobic activity per week, avoidance of smoking, weight control (via diet and exercise), and continued surveillance/management of/for obstructive sleep apnea.   Guideline directed therapy for left ventricular systolic dysfunction: Angiotensin receptor-neprilysin inhibitor (ARNI)-Entresto; beta-blocker therapy - carvedilol, metoprolol succinate, or bisoprolol; mineralocorticoid receptor antagonist (MRA) therapy -spironolactone or eplerenone.  SGLT-2 agents -  Dapagliflozin [place]) or Empagliflozin ([place]).These therapies have been shown to improve clinical outcomes including reduction of rehospitalization, survival, and acute heart failure.    Medication Adjustments/Labs and Tests Ordered: Current medicines are reviewed at length with the patient today.  Concerns regarding medicines are outlined above.  No orders of the defined types were placed in this encounter.  No orders of the defined types  were placed in this encounter.   There are no Patient Instructions on file for this visit.   Signed, Kurt Grooms, MD  03/11/2021 8:31 AM    [place]

## 2021-03-11 NOTE — Patient Instructions (Signed)
Medication Instructions:  Your physician recommends that you continue on your current medications as directed. Please refer to the Current Medication list given to you today.  *If you need a refill on your cardiac medications before your next appointment, please call your pharmacy*   Lab Work: BMET when available.  If you have labs (blood work) drawn today and your tests are completely normal, you will receive your results only by: [place] (if you have MyChart) OR A paper copy in the mail If you have any lab test that is abnormal or we need to change your treatment, we will call you to review the results.   Testing/Procedures: None   Follow-Up: At [facility], you and your health needs are our priority.  As part of our continuing mission to provide you with exceptional heart care, we have created designated Provider Care Teams.  These Care Teams include your primary Cardiologist (physician) and Advanced Practice Providers (APPs -  Physician Assistants and Nurse Practitioners) who all work together to provide you with the care you need, when you need it.  We recommend signing up for the patient portal called "MyChart".  Sign up information is provided on this After Visit Summary.  MyChart is used to connect with patients for Virtual Visits (Telemedicine).  Patients are able to view lab/test results, encounter notes, upcoming appointments, etc.  Non-urgent messages can be sent to your provider as well.   To learn more about what you can do with MyChart, go to [email or web address].    Your next appointment:   6-8 month(s)  The format for your next appointment:   In Person  Provider:   Sinclair Grooms, MD     Other Instructions

## 2021-03-15 ENCOUNTER — Other Ambulatory Visit: Payer: Self-pay

## 2021-03-15 ENCOUNTER — Encounter: Payer: Self-pay | Admitting: *Deleted

## 2021-03-15 ENCOUNTER — Inpatient Hospital Stay: Payer: 59 | Attending: Adult Health | Admitting: *Deleted

## 2021-03-15 DIAGNOSIS — C61 Malignant neoplasm of prostate: Secondary | ICD-10-CM

## 2021-03-15 DIAGNOSIS — R9721 Rising PSA following treatment for malignant neoplasm of prostate: Secondary | ICD-10-CM

## 2021-03-15 NOTE — Progress Notes (Signed)
°  2 Identifiers were used for verification purposes for this visit. No vital signs were taken as this was a telephone visit. Pt denies pain and fatigue. Pt has no issues with urinary urgency nor hesitancy. Pt says, urine flow is normal, no burning with urination. Most nights, pt says he may get up once at night to use restroom. Normal bowel movements soft-formed stools. Pt is exercising 6x weekly for 45 minutes on treadmill and bicycle.Pt recently visited with PCP and received the flu vaccine.He will see dermatologist this year. Last colonoscopy 2018, due 2028.

## 2021-03-16 ENCOUNTER — Other Ambulatory Visit: Payer: 59

## 2021-03-17 ENCOUNTER — Other Ambulatory Visit: Payer: Self-pay

## 2021-03-17 ENCOUNTER — Other Ambulatory Visit: Payer: 59

## 2021-03-17 DIAGNOSIS — I5022 Chronic systolic (congestive) heart failure: Secondary | ICD-10-CM

## 2021-03-18 LAB — BASIC METABOLIC PANEL
BUN/Creatinine Ratio: 21 (ref 10–24)
BUN: 22 mg/dL (ref 8–27)
CO2: 22 mmol/L (ref 20–29)
Calcium: 9.5 mg/dL (ref 8.6–10.2)
Chloride: 107 mmol/L — ABNORMAL HIGH (ref 96–106)
Creatinine, Ser: 1.03 mg/dL (ref 0.76–1.27)
Glucose: 96 mg/dL (ref 70–99)
Potassium: 4.2 mmol/L (ref 3.5–5.2)
Sodium: 142 mmol/L (ref 134–144)
eGFR: 79 mL/min/{1.73_m2} (ref >59)

## 2021-03-21 NOTE — Progress Notes (Signed)
Remote ICD transmission.   

## 2021-03-22 NOTE — Progress Notes (Signed)
Pt has been made aware of normal result and verbalized understanding.  jw

## 2021-03-23 ENCOUNTER — Ambulatory Visit: Payer: 59 | Admitting: Interventional Cardiology

## 2021-04-11 ENCOUNTER — Other Ambulatory Visit: Payer: Self-pay | Admitting: Interventional Cardiology

## 2021-04-12 ENCOUNTER — Telehealth: Payer: Self-pay

## 2021-04-12 NOTE — Telephone Encounter (Signed)
-----   Message from Rockne Menghini, RPH-CPP sent at 04/12/2021  4:02 PM EST ----- Patient calling regarding cholesterol medication

## 2021-04-12 NOTE — Telephone Encounter (Signed)
Lmom for pt to call back. 

## 2021-04-13 NOTE — Telephone Encounter (Signed)
Pt called and stated that their insurance doesn't want to cover the praluent however I am showing them as approved for it until 07/27/21 the pt voiced understanding and stated that they would contact their insurance company

## 2021-05-23 ENCOUNTER — Ambulatory Visit (INDEPENDENT_AMBULATORY_CARE_PROVIDER_SITE_OTHER): Payer: 59 | Admitting: Physician Assistant

## 2021-05-23 ENCOUNTER — Encounter: Payer: Self-pay | Admitting: Physician Assistant

## 2021-05-23 VITALS — BP 120/60 | HR 69 | Temp 97.2°F | Ht 67.0 in | Wt 162.2 lb

## 2021-05-23 DIAGNOSIS — R9721 Rising PSA following treatment for malignant neoplasm of prostate: Secondary | ICD-10-CM

## 2021-05-23 DIAGNOSIS — I5021 Acute systolic (congestive) heart failure: Secondary | ICD-10-CM

## 2021-05-23 DIAGNOSIS — E785 Hyperlipidemia, unspecified: Secondary | ICD-10-CM | POA: Diagnosis not present

## 2021-05-23 DIAGNOSIS — Z0001 Encounter for general adult medical examination with abnormal findings: Secondary | ICD-10-CM | POA: Diagnosis not present

## 2021-05-23 DIAGNOSIS — J309 Allergic rhinitis, unspecified: Secondary | ICD-10-CM

## 2021-05-23 DIAGNOSIS — M109 Gout, unspecified: Secondary | ICD-10-CM

## 2021-05-23 DIAGNOSIS — C61 Malignant neoplasm of prostate: Secondary | ICD-10-CM

## 2021-05-23 DIAGNOSIS — M25551 Pain in right hip: Secondary | ICD-10-CM

## 2021-05-23 LAB — CBC WITH DIFFERENTIAL/PLATELET
Basophils Absolute: 0 10*3/uL (ref 0.0–0.1)
Basophils Relative: 0.8 % (ref 0.0–3.0)
Eosinophils Absolute: 0.2 10*3/uL (ref 0.0–0.7)
Eosinophils Relative: 3.2 % (ref 0.0–5.0)
HCT: 44.8 % (ref 39.0–52.0)
Hemoglobin: 15.2 g/dL (ref 13.0–17.0)
Lymphocytes Relative: 16.2 % (ref 12.0–46.0)
Lymphs Abs: 0.8 10*3/uL (ref 0.7–4.0)
MCHC: 34 g/dL (ref 30.0–36.0)
MCV: 99.9 fl (ref 78.0–100.0)
Monocytes Absolute: 0.4 10*3/uL (ref 0.1–1.0)
Monocytes Relative: 7.1 % (ref 3.0–12.0)
Neutro Abs: 3.7 10*3/uL (ref 1.4–7.7)
Neutrophils Relative %: 72.7 % (ref 43.0–77.0)
Platelets: 206 10*3/uL (ref 150.0–400.0)
RBC: 4.49 Mil/uL (ref 4.22–5.81)
RDW: 13.4 % (ref 11.5–15.5)
WBC: 5.1 10*3/uL (ref 4.0–10.5)

## 2021-05-23 LAB — COMPREHENSIVE METABOLIC PANEL
ALT: 20 U/L (ref 0–53)
AST: 18 U/L (ref 0–37)
Albumin: 4.6 g/dL (ref 3.5–5.2)
Alkaline Phosphatase: 97 U/L (ref 39–117)
BUN: 23 mg/dL (ref 6–23)
CO2: 25 mEq/L (ref 19–32)
Calcium: 9.5 mg/dL (ref 8.4–10.5)
Chloride: 105 mEq/L (ref 96–112)
Creatinine, Ser: 1.14 mg/dL (ref 0.40–1.50)
GFR: 65.96 mL/min (ref >60.00)
Glucose, Bld: 111 mg/dL — ABNORMAL HIGH (ref 70–99)
Potassium: 4 mEq/L (ref 3.5–5.1)
Sodium: 139 mEq/L (ref 135–145)
Total Bilirubin: 0.6 mg/dL (ref 0.2–1.2)
Total Protein: 6.9 g/dL (ref 6.0–8.3)

## 2021-05-23 LAB — URIC ACID: Uric Acid, Serum: 4.3 mg/dL (ref 4.0–7.8)

## 2021-05-23 LAB — PSA: PSA: 0.09 ng/mL — ABNORMAL LOW (ref 0.10–4.00)

## 2021-05-23 LAB — LIPID PANEL
Cholesterol: 124 mg/dL (ref 0–200)
HDL: 39.9 mg/dL (ref >39.00)
NonHDL: 83.64
Total CHOL/HDL Ratio: 3
Triglycerides: 272 mg/dL — ABNORMAL HIGH (ref 0.0–149.0)
VLDL: 54.4 mg/dL — ABNORMAL HIGH (ref 0.0–40.0)

## 2021-05-23 LAB — LDL CHOLESTEROL, DIRECT: Direct LDL: 52 mg/dL

## 2021-05-23 NOTE — Progress Notes (Signed)
? ?Subjective:  ?  ?Kurt Reilly is a 71 y.o. male and is here for a comprehensive physical exam. ? ? ?HPI ? ?Health Maintenance Due  ?Topic Date Due  ? COLONOSCOPY (Pts 45-33yr Insurance coverage will need to be confirmed)  03/04/2014  ? ? ?Acute Concerns: ?Allergies ?For the first time ever, pt has started experiencing allergy sx including eye pain and nasal congestion. Due to this he has been using flonase nasal spray every few days. Although he found the nasal spray to be beneficial, he has recently noticed that it would cause throat dryness the next day. As a result, he has just bought a form of the flonase spray that contains less medication. At this time he is hoping this will resolve any issues he's having.  ? ? ?Hip Pain ?Upon further discussion, Kurt Reilly that he was experiencing intermittent lower back and hip pain that became so painful that he could barely walk after long car rides. States that following multiple PT sessions and continued exercise, he has found this to improve. Although there is improvement, he admits that he does feel some pain sporadically and infrequently, which he believes to be a part of growing older. Despite all of this, he is managing well and declines further intervention.  ? ? ?Chronic Issues: ?Gout ?JSequoyahis compliant with taking allopurinol 300 mg daily with no adverse effects. States that he hasn't noticed any lingering sx of gout and has been managing well. He is interested in re-checking his uric acid levels today. Denies worsening sx.  ? ? ?HLD; CHF ?Pt is currently compliant with taking aspirin 81 mg, entresto 49-51 mg, jardiance 10 mg  and plavix 75 mg daily with no adverse effects. Pt states that he has been told that with the entresto and jardiance, that his heart function has improved. He is regularly following up with Dr. STamala Julian cardiology and is managing well. Denies concerning sx.   ? ?Hx of Prostate Cancer ?Last saw [facility] on 01/13/21 -- they  recommended that he follow-up with them in 6 months for repeat PSA but he is requesting that I do this. ? ?Health Maintenance: ?Immunizations -- Covid-  UTD ?Influenza- UTD ?Tdap- UTD;2016 ?Colonoscopy -- 2014; Will discuss further next year ?PSA --  ?Lab Results  ?Component Value Date  ? PSA 0.46 02/06/2020  ? ?Diet -- Eats all food groups  ?Sleep habits -- No concerns ?Exercise -- Cardio and light strength training daily ?Weight -- Stable ?Recent weight history ?Wt Readings from Last 10 Encounters:  ?05/23/21 162 lb 3.2 oz (73.6 kg)  ?03/11/21 161 lb (73 kg)  ?01/03/21 163 lb 9.6 oz (74.2 kg)  ?12/07/20 161 lb (73 kg)  ?11/10/20 159 lb 3.2 oz (72.2 kg)  ?11/05/20 156 lb (70.8 kg)  ?09/22/20 159 lb 12.8 oz (72.5 kg)  ?09/09/20 162 lb 3.2 oz (73.6 kg)  ?07/13/20 161 lb (73 kg)  ?06/18/20 161 lb 6.4 oz (73.2 kg)  ? ?Body mass index is 25.4 kg/m?. ?Mood -- Stable ?Alcohol use --  reports current alcohol use of about 2.0 standard drinks per week.  ?Tobacco use --  ?Tobacco Use: Low Risk   ? Smoking Tobacco Use: Never  ? Smokeless Tobacco Use: Never  ? Passive Exposure: Not on file  ?  ? ?[facility] 03/15/2021  ?Decreased Interest 0  ?Down, Depressed, Hopeless 0  ?PHQ - 2 Score 0  ? ? ?Other providers/specialists: ?Patient Care Team: ?WInda Coke PA as PCP - General (  Physician Assistant) ?Belva Crome, MD as PCP - Cardiology (Cardiology) ?Lyndee Hensen, PT as Physical Therapist (Physical Therapy) ?Irine Seal, MD as Attending Physician (Urology) ?Tyler Pita, MD as Consulting Physician (Radiation Oncology) ?Gardenia Phlegm, NP as Nurse Practitioner (Hematology and Oncology) ?Harmon Pier, RN as Registered Nurse  ? ? ?PMHx, SurgHx, SocialHx, Medications, and Allergies were reviewed in the Visit Navigator and updated as appropriate.  ? ?Past Medical History:  ?Diagnosis Date  ? Actinic keratosis   ? Alkaline phosphatase raised   ? Anemia   ? Blood transfusion without reported  diagnosis 2016  ? Cardiac arrhythmia   ? Cardiomyopathy ([place])   ? CHF (congestive heart failure) ([place])   ? Coronary arteriosclerosis   ? ED (erectile dysfunction)   ? Gout 03/09/2017  ? Hemangioma 08/24/2018  ? Hyperlipidemia   ? Hypertension   ? Hypertrophic scar   ? Malignant neoplasm of prostate [facility]) 2013  ? Was told that less than 1% of cancer  ? Melanocytic nevi of trunk   ? Melanocytic nevus of skin   ? Senile hyperkeratosis   ? STEMI (ST elevation myocardial infarction) ([place]) 2016  ? ? ? ?Past Surgical History:  ?Procedure Laterality Date  ? ICD IMPLANT    ? PROSTATECTOMY  2013  ? SUBQ ICD CHANGEOUT N/A 06/07/2020  ? Procedure: SUBQ ICD CHANGEOUT;  Surgeon: Evans Lance, MD;  Location: [place] [facility];  Service: Cardiovascular;  Laterality: N/A;  ? ? ? ?Family History  ?Problem Relation Age of Onset  ? Hypertension Father   ? High Cholesterol Father   ? Stomach cancer Father   ? Bladder Cancer Brother   ? Diabetes Brother   ? Yves Dill Parkinson White syndrome Son   ? Migraines Son   ? Prostate cancer Neg Hx   ? Breast cancer Neg Hx   ? Colon cancer Neg Hx   ? Pancreatic cancer Neg Hx   ? ? ?Social History  ? ?Tobacco Use  ? Smoking status: Never  ? Smokeless tobacco: Never  ?Vaping Use  ? Vaping Use: Never used  ?Substance Use Topics  ? Alcohol use: Yes  ?  Alcohol/week: 2.0 standard drinks  ?  Types: 2 Glasses of wine per week  ?  Comment: 2 glasses of wine per week  ? Drug use: Never  ? ? ?Review of Systems:  ? ?Review of Systems  ?Constitutional:  Negative for chills, fever, malaise/fatigue and weight loss.  ?HENT:  Negative for hearing loss, sinus pain and sore throat.   ?Respiratory:  Negative for cough and hemoptysis.   ?Cardiovascular:  Negative for chest pain, palpitations, leg swelling and PND.  ?Gastrointestinal:  Negative for abdominal pain, constipation, diarrhea, heartburn, nausea and vomiting.  ?Genitourinary:  Negative for dysuria, frequency and urgency.  ?Musculoskeletal:  Negative for  back pain, myalgias and neck pain.  ?Skin:  Negative for itching and rash.  ?Neurological:  Negative for dizziness, tingling, seizures and headaches.  ?Endo/Heme/Allergies:  Negative for polydipsia.  ?Psychiatric/Behavioral:  Negative for depression. The patient is not nervous/anxious.   ? ?Objective:  ? ? ?Vitals:  ? 05/23/21 0811  ?BP: 120/60  ?Pulse: 69  ?Temp: (!) 97.2 ?F (36.2 ?C)  ?SpO2: 97%  ? ?Body mass index is 25.4 kg/m?. ? ?General  Alert, cooperative, no distress, appears stated age  ?Head:  Normocephalic, without obvious abnormality, atraumatic  ?Eyes:  PERRL, conjunctiva/corneas clear, EOM's intact, fundi benign, both eyes       ?Ears:  Normal TM's and external ear canals, both ears  ?Nose: Nares normal, septum midline, mucosa normal, no drainage or sinus tenderness  ?Throat: Lips, mucosa, and tongue normal; teeth and gums normal  ?Neck: Supple, symmetrical, trachea midline, no adenopathy;     ?thyroid:  No enlargement/tenderness/nodules; no carotid ?bruit or JVD  ?Back:   Symmetric, no curvature, ROM normal, no CVA tenderness  ?Lungs:   Clear to auscultation bilaterally, respirations unlabored  ?Chest wall:  No tenderness or deformity  ?Heart:  Regular rate and rhythm, S1 and S2 normal, no murmur, rub or gallop  ?Abdomen:   Soft, non-tender, bowel sounds active all four quadrants, no masses, no organomegaly  ?Extremities: Extremities normal, atraumatic, no cyanosis or edema  ?Prostate : Not done.   ?Skin: Skin color, texture, turgor normal, no rashes or lesions  ?Lymph nodes: Cervical, supraclavicular, and axillary nodes normal  ?Neurologic: CNII-XII grossly intact. Normal strength, sensation and reflexes throughout  ? ?AssessmentPlan:  ? ?Routine physical examination with abnormal findings ?Today patient counseled on age appropriate routine health concerns for screening and prevention, each reviewed and up to date or declined. Immunizations reviewed and up to date or declined. Labs ordered and  reviewed. Risk factors for depression reviewed and negative. Hearing function and visual acuity are intact. ADLs screened and addressed as needed. Functional ability and level of safety reviewed and appropriate. Education,

## 2021-05-23 NOTE — Patient Instructions (Addendum)
It was great to see you! ? ?Update blood work today ?Trial the new flonase dose and keep [place] posted on how you feel with this ?You will get a call from our pharmacist, Gerald Stabs, to go over your medications and costs ? ?Please go to the lab for blood work.  ? ?Our office will call you with your results unless you have chosen to receive results via MyChart. ? ?If your blood work is normal we will follow-up each year for physicals and as scheduled for chronic medical problems. ? ?If anything is abnormal we will treat accordingly and get you in for a follow-up. ? ?Take care, ? ?Aldona Bar ?  ?

## 2021-05-25 ENCOUNTER — Other Ambulatory Visit: Payer: Self-pay | Admitting: Physician Assistant

## 2021-05-25 ENCOUNTER — Other Ambulatory Visit: Payer: Self-pay | Admitting: Interventional Cardiology

## 2021-05-25 ENCOUNTER — Telehealth: Payer: Self-pay | Admitting: Physician Assistant

## 2021-05-25 DIAGNOSIS — G8929 Other chronic pain: Secondary | ICD-10-CM

## 2021-05-25 NOTE — Progress Notes (Signed)
?  Chronic Care Management  ? ?Outreach Note ? ?05/25/2021 ?Name: Kurt Reilly MRN: [id number] DOB: 05-11-1950 ? ?Referred by: Inda Coke, [place] ?Reason for referral : No chief complaint on file. ? ? ?An unsuccessful telephone outreach was attempted today. The patient was referred to the pharmacist for assistance with care management and care coordination.  ? ?Follow Up Plan:  ? ?Tatjana Dellinger ?Upstream Scheduler  ?

## 2021-05-27 ENCOUNTER — Telehealth: Payer: Self-pay | Admitting: Physician Assistant

## 2021-05-27 NOTE — Progress Notes (Signed)
?  Chronic Care Management  ? ?Note ? ?05/27/2021 ?Name: Kurt Reilly MRN: [id number] DOB: January 08, 1951 ? ?Kurt Reilly is a 71 y.o. year old male who is a primary care patient of Inda Coke, [place]. I reached out to [name] by phone today in response to a referral sent by [name] PCP, Inda Coke, PA.  ? ?[name] was given information about Chronic Care Management services today including:  ?CCM service includes personalized support from designated clinical staff supervised by his physician, including individualized plan of care and coordination with other care providers ?24/7 contact phone numbers for assistance for urgent and routine care needs. ?Service will only be billed when office clinical staff spend 20 minutes or more in a month to coordinate care. ?Only one practitioner may furnish and bill the service in a calendar month. ?The patient may stop CCM services at any time (effective at the end of the month) by phone call to the office staff. ? ? ?Patient agreed to services and verbal consent obtained.  ? ?Follow up plan:pt aware $25 copay and $750 ded ? ? ?Tatjana Dellinger ?Upstream Scheduler  ?

## 2021-05-31 ENCOUNTER — Telehealth: Payer: 59

## 2021-06-07 ENCOUNTER — Telehealth: Payer: Self-pay | Admitting: Pharmacist

## 2021-06-07 NOTE — Progress Notes (Signed)
? ? ?Chronic Care Management ?Pharmacy Assistant  ? ?Name: Kurt Reilly  MRN: [id number] DOB: 1950-04-14 ? ? ?Reason for Encounter: Chart Review For Initial Visit With Clinical Pharmacist ?  ?Conditions to be addressed/monitored: ?HTN, CHF, ICD, HLD, Gout, Hyperglycemia, Neoplasm of prostate ? ?Primary concerns for visit include: ?HTN, HLD, Hyperglycemia  ? ?Recent office visits:  ?05/23/2021 OV (PCP) Inda Coke, PA; no medication changes indicated. ? ?Recent consult visits:  ?03/11/2021 [facility] (Cardiology) Belva Crome, MD; Consider discontinuing furosemide. ? ?01/03/2021 [facility] (Cardiology) Bensimhon, Shaune Pascal, MD; - Has been reluctant about SGLT2i in past. Long discussion about pros/cons of Jardiance. Agreeable to start today. Will likely need to liberalize fluid and salt intake to tolerate ?- I gave him PRN lasix and Kcl to use as needed ? ?Hospital visits:  ?None in previous 6 months ? ?Medications: ?Outpatient Encounter Medications as of 06/07/2021  ?Medication Sig  ? Alirocumab (PRALUENT) 150 MG/ML SOAJ Inject 150 mg into the skin every 14 (fourteen) days.  ? allopurinol (ZYLOPRIM) 300 MG tablet TAKE 1 TABLET BY MOUTH EVERY DAY  ? aspirin 81 MG chewable tablet Chew 81 mg by mouth daily.  ? B Complex-C-Folic Acid (STRESS 101 B-COMPLEX PO) Take 1 tablet by mouth daily.  ? BLACK ELDERBERRY PO Take by mouth as needed.  ? carvedilol (COREG) 6.25 MG tablet TAKE 1 TABLET BY MOUTH  TWICE DAILY WITH MEALS  ? Cholecalciferol (VITAMIN D-3) 25 MCG (1000 UT) CAPS Take 1,000 Units by mouth daily.  ? clopidogrel (PLAVIX) 75 MG tablet TAKE 1 TABLET BY MOUTH EVERY DAY  ? Colchicine 0.6 MG CAPS Take 0.6 mg by mouth daily as needed (gout).  ? empagliflozin (JARDIANCE) 10 MG TABS tablet Take 1 tablet (10 mg total) by mouth daily before breakfast.  ? ferrous sulfate 325 (65 FE) MG tablet Take 325 mg by mouth daily with breakfast.  ? fluticasone (FLONASE) 50 MCG/ACT nasal spray Place 1 spray into both nostrils daily as needed for  allergies or rhinitis.  ? furosemide (LASIX) 20 MG tablet Take 1 tablet (20 mg total) by mouth daily as needed for fluid or edema.  ? MILK THISTLE PO Take 525 mg by mouth every other day.   ? nitroGLYCERIN (NITROSTAT) 0.4 MG SL tablet PLACE 1 TABLET UNDER THE TONGUE EVERY 5 MINUTES AS NEEDED FOR CHEST PAIN  ? potassium chloride SA (KLOR-CON M20) 20 MEQ tablet Take 1 tablet by mouth only when you take the Torsemide  ? sacubitril-valsartan (ENTRESTO) 49-51 MG Take 1 tablet by mouth 2 (two) times daily.  ? spironolactone (ALDACTONE) 25 MG tablet TAKE 1/2 TABLET BY MOUTH EVERY DAY  ? ?No facility-administered encounter medications on file as of 06/07/2021.  ? ?Current Medications: ?Plavix 75 mg last filled 05/25/2021 90 DS ?Allopurinol 300 mg last filled 05/25/2021 90 DS ?Nitroglycerin 0.4 mg last filled 04/11/2021 5 DS ?Aspirin 81 mg ?Carvedilol 6.25 mg last filled 05/18/2021 90 DS ?Spironolactone 25 mg last filled 04/18/2021 90 DS ?Jardiance 10 mg last filled 04/06/2021 90 DS ?Furosemide 20 mg last filled 04/02/2021 90 DS ?Potassium chloride 20 meq last filled 04/02/2021 90 DS ?Black elderberry PO ?Alirocumab 150 mg/mL last filled 04/14/2021 84 DS ?Entresto 49-51 mg last filled 05/25/2021 90 DS ?B Complex-C-Folic Acid ?Fluticasone 50 mcg/act ?Colchicine 0.6 mg ?Ferrous Sulfate 325 mg ?Milk Thistle PO ?Vitamin D-3 ? ?Patient Questions: ?Any changes in your medications or health? ?Patient states he hasn't had any changes in his medication or health recently. ? ?Any side effects from any  medications?  ?Patient denies having any side effects from any of his medications. ? ?Do you have any symptoms or problems not managed by your medications? ?Patient denies having any symptoms or problems that are not currently managed by his medications. ? ?Any concerns about your health right now? ?Patient denies any concerns with his health right now. ? ?Has your provider asked that you check blood pressure, blood sugar, or follow  special diet at home? ?Patient states he checks blood pressure once a day. He does not check blood sugars. He states he eats a "healthy diet" with low salt. ? ?Do you get any type of exercise on a regular basis? ?Patient states he works out 45 minutes a day. He likes to use a treadmill and a stationary bike. ? ?Can you think of a goal you would like to reach for your health? ?"No, I think I am at it right now." ? ?Do you have any problems getting your medications? ?Patient denies having any problems getting his medications. ? ?Is there anything that you would like to discuss during the appointment?  ?Patient states he does not have anything he would like to discuss. ? ?Please bring medications and supplements to appointment ? ?Care Gaps: ?Medicare Annual Wellness: Due now, last AWV 10/17/2019 ?Hemoglobin A1C: none available ?Colonoscopy: Overdue since 03/04/2014 ? ?Future Appointments  ?Date Time Provider [place]  ?06/08/2021  7:00 AM [facility] DEVICE REMOTES [facility]  ?06/10/2021  9:30 AM [facility] CCM PHARMACIST [facility]  ?09/07/2021  7:00 AM [facility] DEVICE REMOTES [facility]  ?09/09/2021 11:20 AM Belva Crome, MD [facility]  ?12/07/2021  7:00 AM [facility] DEVICE REMOTES [facility]  ?03/08/2022  7:00 AM [facility] DEVICE REMOTES [facility]  ? ?Star Rating Drugs: ?Jardiance 10 mg last filled 04/06/2021 90 DS ?Entresto 49-51 mg last filled 05/25/2021 90 DS ? ?April D Calhoun, [place] ?Clinical Pharmacist Assistant ?[phone] ?

## 2021-06-07 NOTE — Progress Notes (Signed)
? ?Chronic Care Management ?Pharmacy Note ? ?06/10/2021 ?Name:  Kurt Reilly MRN:  [id number] DOB:  April 27, 1950 ? ?Summary: ?Initial visit with PharmD.  Patient on a few brand name meds.  Household income may prevent him from qualifying for any programs.  Doing very well overall and no concerns with meds at this time. ? ?Recommendations/Changes made from today's visit: ?No changes ? ?Plan: ?FU 1 year or sooner with med issues ? ? ?Subjective: ?Kurt Reilly is an 71 y.o. year old male who is a primary patient of Inda Coke, [place].  The CCM team was consulted for assistance with disease management and care coordination needs.   ? ?Engaged with patient by telephone for initial visit in response to provider referral for pharmacy case management and/or care coordination services.  ? ?Consent to Services:  ?The patient was given the following information about Chronic Care Management services today, agreed to services, and gave verbal consent: 1. CCM service includes personalized support from designated clinical staff supervised by the primary care provider, including individualized plan of care and coordination with other care providers 2. 24/7 contact phone numbers for assistance for urgent and routine care needs. 3. Service will only be billed when office clinical staff spend 20 minutes or more in a month to coordinate care. 4. Only one practitioner may furnish and bill the service in a calendar month. 5.The patient may stop CCM services at any time (effective at the end of the month) by phone call to the office staff. 6. The patient will be responsible for cost sharing (co-pay) of up to 20% of the service fee (after annual deductible is met). Patient agreed to services and consent obtained. ? ?Patient Care Team: ?Inda Coke, [place] as PCP - General (Physician Assistant) ?Belva Crome, MD as PCP - Cardiology (Cardiology) ?Lyndee Hensen, PT as Physical Therapist (Physical Therapy) ?Irine Seal, MD as Attending Physician  (Urology) ?Tyler Pita, MD as Consulting Physician (Radiation Oncology) ?Gardenia Phlegm, NP as Nurse Practitioner (Hematology and Oncology) ?Harmon Pier, RN as Registered Nurse ?Edythe Clarity, [facility] as Pharmacist (Pharmacist) ? ?Recent office visits:  ?05/23/2021 OV (PCP) Inda Coke, PA; no medication changes indicated. ?  ?Recent consult visits:  ?03/11/2021 [facility] (Cardiology) Belva Crome, MD; Consider discontinuing furosemide. ?  ?01/03/2021 [facility] (Cardiology) Bensimhon, Shaune Pascal, MD; - Has been reluctant about SGLT2i in past. Long discussion about pros/cons of Jardiance. Agreeable to start today. Will likely need to liberalize fluid and salt intake to tolerate ?- I gave him PRN lasix and Kcl to use as needed ?  ?Hospital visits:  ?None in previous 6 months ? ? ?Objective: ? ?Lab Results  ?Component Value Date  ? CREATININE 1.14 05/23/2021  ? BUN 23 05/23/2021  ? GFR 65.96 05/23/2021  ? EGFR 79 03/17/2021  ? GFRNONAA 61 11/25/2019  ? GFRAA 70 11/25/2019  ? NA 139 05/23/2021  ? K 4.0 05/23/2021  ? CALCIUM 9.5 05/23/2021  ? CO2 25 05/23/2021  ? GLUCOSE 111 (H) 05/23/2021  ? ? ?Lab Results  ?Component Value Date/Time  ? GFR 65.96 05/23/2021 09:01 AM  ?  ?Last diabetic Eye exam: No results found for: HMDIABEYEEXA  ?Last diabetic Foot exam: No results found for: HMDIABFOOTEX  ? ?Lab Results  ?Component Value Date  ? CHOL 124 05/23/2021  ? HDL 39.90 05/23/2021  ? [place] 48 09/22/2020  ? LDLDIRECT 52.0 05/23/2021  ? TRIG 272.0 (H) 05/23/2021  ? CHOLHDL 3 05/23/2021  ? ? ? ?  Latest Ref  Rng & Units 05/23/2021  ?  9:01 AM 09/22/2020  ? 12:13 PM 07/24/2019  ?  7:15 AM  ?Hepatic Function  ?Total Protein 6.0 - 8.3 g/dL 6.9   7.1   7.3    ?Albumin 3.5 - 5.2 g/dL 4.6   4.9   4.7    ?AST 0 - 37 U/L _0 ?ALT 0 - 53 U/L _1 ?Alk Phosphatase 39 - 117 U/L 97   93   118    ?Total Bilirubin 0.2 - 1.2 mg/dL 0.6   0.4   0.5    ?Bilirubin, Direct 0.00 - 0.40 mg/dL  0.14   0.17    ? ? ?Lab  Results  ?Component Value Date/Time  ? TSH 1.130 09/22/2020 12:13 PM  ? ? ? ?  Latest Ref Rng & Units 05/23/2021  ?  9:01 AM 05/18/2020  ?  9:02 AM 07/30/2019  ? 11:43 AM  ?CBC  ?WBC 4.0 - 10.5 K/uL 5.1   5.6   7.3    ?Hemoglobin 13.0 - 17.0 g/dL 15.2   13.9   13.9    ?Hematocrit 39.0 - 52.0 % 44.8   40.6   41.7    ?Platelets 150.0 - 400.0 K/uL 206.0   250   261    ? ? ?No results found for: VD25OH ? ?Clinical ASCVD: Yes  ?The ASCVD Risk score (Arnett DK, et al., 2019) failed to calculate for the following reasons: ?  The valid total cholesterol range is 130 to 320 mg/dL   ? ? ?  03/15/2021  ? 12:05 PM 11/10/2020  ? 11:20 AM 08/25/2019  ?  1:25 PM  ?Depression screen PHQ 2/9  ?Decreased Interest 0 0 0  ?Down, Depressed, Hopeless 0 0 0  ?PHQ - 2 Score 0 0 0  ?  ? ? ? ?Social History  ? ?Tobacco Use  ?Smoking Status Never  ?Smokeless Tobacco Never  ? ?BP Readings from Last 3 Encounters:  ?05/23/21 120/60  ?03/11/21 98/62  ?01/03/21 140/80  ? ?Pulse Readings from Last 3 Encounters:  ?05/23/21 69  ?03/11/21 67  ?01/03/21 61  ? ?Wt Readings from Last 3 Encounters:  ?05/23/21 162 lb 3.2 oz (73.6 kg)  ?03/11/21 161 lb (73 kg)  ?01/03/21 163 lb 9.6 oz (74.2 kg)  ? ?BMI Readings from Last 3 Encounters:  ?05/23/21 25.40 kg/m?  ?03/11/21 25.22 kg/m?  ?01/03/21 25.62 kg/m?  ? ? ?Assessment/Interventions: Review of patient past medical history, allergies, medications, health status, including review of consultants reports, laboratory and other test data, was performed as part of comprehensive evaluation and provision of chronic care management services.  ? ?SDOH:  (Social Determinants of Health) assessments and interventions performed: Yes ? ?Financial Resource Strain: Low Risk   ? Difficulty of Paying Living Expenses: Not hard at all  ? ?Food Insecurity: No Food Insecurity  ? Worried About Charity fundraiser in the Last Year: Never true  ? Ran Out of Food in the Last Year: Never true  ? ? ?SDOH Screenings  ? ?Alcohol Screen: Low Risk    ? Last Alcohol Screening Score (AUDIT): 0  ?Depression (PHQ2-9): Low Risk   ? PHQ-2 Score: 0  ?Financial Resource Strain: Low Risk   ? Difficulty of Paying Living Expenses: Not hard at all  ?Food Insecurity: No Food Insecurity  ? Worried About Charity fundraiser in the Last Year:  Never true  ? Ran Out of Food in the Last Year: Never true  ?Housing: Low Risk   ? Last Housing Risk Score: 0  ?Physical Activity: Sufficiently Active  ? Days of Exercise per Week: 6 days  ? Minutes of Exercise per Session: 50 min  ?Social Connections: Moderately Integrated  ? Frequency of Communication with Friends and Family: More than three times a week  ? Frequency of Social Gatherings with Friends and Family: Twice a week  ? Attends Religious Services: 1 to 4 times per year  ? Active Member of Clubs or Organizations: No  ? Attends Archivist Meetings: Never  ? Marital Status: Married  ?Stress: No Stress Concern Present  ? Feeling of Stress : Not at all  ?Tobacco Use: Low Risk   ? Smoking Tobacco Use: Never  ? Smokeless Tobacco Use: Never  ? Passive Exposure: Not on file  ?Transportation Needs: No Transportation Needs  ? Lack of Transportation (Medical): No  ? Lack of Transportation (Non-Medical): No  ? ? ?CCM Care Plan ? ?Allergies  ?Allergen Reactions  ? Ezetimibe Other (See Comments)  ?  Muscle aches   ? Repatha [Evolocumab]   ?  myalgias  ? Statins Other (See Comments)  ?  Myalgia  ? Penicillins Rash  ?  Occurred as a child - tolerated Zosyn June 2016 ?  ? ? ?Medications Reviewed Today   ? ? Reviewed by Edythe Clarity, RPH (Pharmacist) on 06/10/21 at 1045  Med List Status: <None>  ? ?Medication Order Taking? Sig Documenting Provider Last Dose Status Informant  ?Alirocumab (PRALUENT) 150 MG/ML SOAJ [id number] Yes Inject 150 mg into the skin every 14 (fourteen) days. Belva Crome, MD Taking Active   ?allopurinol (ZYLOPRIM) 300 MG tablet [id number] Yes TAKE 1 TABLET BY MOUTH EVERY DAY Vivi Barrack, MD Taking Active    ?aspirin 81 MG chewable tablet [id number] Yes Chew 81 mg by mouth daily. [provider] Taking Active   ?B Complex-C-Folic Acid (STRESS 311 B-COMPLEX PO) [id number] Yes Take 1 tablet by mouth

## 2021-06-08 ENCOUNTER — Ambulatory Visit (INDEPENDENT_AMBULATORY_CARE_PROVIDER_SITE_OTHER): Payer: Medicare Other

## 2021-06-08 DIAGNOSIS — I255 Ischemic cardiomyopathy: Secondary | ICD-10-CM

## 2021-06-09 LAB — CUP PACEART REMOTE DEVICE CHECK
Battery Remaining Percentage: 90 %
Date Time Interrogation Session: 20230406092000
Implantable Lead Implant Date: 20170727
Implantable Lead Location: 753862
Implantable Lead Model: 3401
Implantable Pulse Generator Implant Date: 20220404
Pulse Gen Serial Number: 156707

## 2021-06-10 ENCOUNTER — Ambulatory Visit (INDEPENDENT_AMBULATORY_CARE_PROVIDER_SITE_OTHER): Payer: Medicare Other | Admitting: Pharmacist

## 2021-06-10 DIAGNOSIS — I10 Essential (primary) hypertension: Secondary | ICD-10-CM

## 2021-06-10 DIAGNOSIS — E785 Hyperlipidemia, unspecified: Secondary | ICD-10-CM

## 2021-06-10 DIAGNOSIS — I5021 Acute systolic (congestive) heart failure: Secondary | ICD-10-CM

## 2021-06-10 NOTE — Patient Instructions (Addendum)
Visit Information ? ? Goals Addressed   ? ?  ?  ?  ?  ? This Visit's Progress  ?  Track and Manage Fluids and Swelling-Heart Failure     ?  Timeframe:  Long-Range Goal ?Priority:  High ?Start Date:     06/10/21                        ?Expected End Date:    12/10/21                  ? ?Follow Up Date 09/09/21  ?  ?- call office if I gain more than 2 pounds in one day or 5 pounds in one week ?- use salt in moderation  ?  ?Why is this important?   ?It is important to check your weight daily and watch how much salt and liquids you have.  ?It will help you to manage your heart failure.  ?  ?Notes:  ?  ? ?  ? ?Patient Care Plan: General Pharmacy (Adult)  ?  ? ?Problem Identified: CHF, HTN, CAD, HLD, Cardiomyopathy   ?Priority: High  ?Onset Date: 06/10/2021  ?  ? ?Long-Range Goal: Patient-Specific Goal   ?Start Date: 06/10/2021  ?Expected End Date: 12/10/2021  ?This Visit's Progress: On track  ?Priority: High  ?Note:   ?Current Barriers:  ?Possible copay concern ? ?Pharmacist Clinical Goal(s):  ?Patient will verbalize ability to afford treatment regimen through collaboration with PharmD and provider.  ? ?Interventions: ?1:1 collaboration with Inda Coke, PA regarding development and update of comprehensive plan of care as evidenced by provider attestation and co-signature ?Inter-disciplinary care team collaboration (see longitudinal plan of care) ?Comprehensive medication review performed; medication list updated in electronic medical record ? ?Hypertension (BP goal <130/80) ?-Controlled ?-Current treatment: ?Spironolactone 12.'5mg'$  Appropriate, Effective, Safe, Accessible ?Carvedilol 6.'25mg'$  twice daily Appropriate, Effective, Safe, Accessible ?-Medications previously tried: losartan  ?-Current home readings: "norma;", no specific logs reviewed today ?-Current dietary habits: watching salt intake ?-Current exercise habits: works out on treadmill for about 2 miles per day ?-Denies hypotensive/hypertensive symptoms ?-Educated on  BP goals and benefits of medications for prevention of heart attack, stroke and kidney damage; ?Daily salt intake goal < 2300 mg; ?Symptoms of hypotension and importance of maintaining adequate hydration; ?-Counseled to monitor BP at home daily, document, and provide log at future appointments ?-Recommended to continue current medication ?Does mention one period of low blood pressure 06C systolic.  He felt "off."  This has not been common, counseled him to continue to monitor BP and reach out if he experiences these symptoms again. ? ?Hyperlipidemia: (LDL goal < 70) ?-Controlled ?-Current treatment: ?Praluent '150mg'$ /ml Appropriate, Effective, Safe, Accessible ?-Medications previously tried: statins, Repatha  ?-Educated on Cholesterol goals;  ?Benefits of statin for ASCVD risk reduction; ?Importance of limiting foods high in cholesterol; ?-Recommended to continue current medication ?Assessed patient finances. He just got off of commercial insurance.  He is concerned about copays with brand name medications.  Reports household income around 120,000.  May bump him out of any assistance programs, will research for patient and help where possible. ? ?Heart Failure (Goal: manage symptoms and prevent exacerbations) ?-Controlled ?-Last ejection fraction: 25% ?-Current treatment: ?Jardiance '10mg'$  Appropriate, Effective, Safe, Accessible ?Entresto 49-51 Appropriate, Effective, Safe, Accessible ?Carvedilol 6.'25mg'$  twice daily Appropriate, Effective, Safe, Accessible ?Spironolactone 12.'5mg'$  Appropriate, Effective, Safe, Accessible ? ?-Medications previously tried: none noted  ?-Current home BP/HR readings: controlled at home ?-Current dietary habits: watching salt intake ?-  Current exercise habits: 2 mile on treadmill ?-Educated on Benefits of medications for managing symptoms and prolonging life ?Importance of weighing daily; if you gain more than 3 pounds in one day or 5 pounds in one week, contact providers ?Importance of blood  pressure control ?-Recommended to continue current medication ? ?Patient Goals/Self-Care Activities ?Patient will:  ?- take medications as prescribed as evidenced by patient report and record review ?weigh daily, and contact provider if weight gain of 3 lbs in one day or 5 lbs in a week ?collaborate with provider on medication access solutions ? ?Follow Up Plan: The care management team will reach out to the patient again over the next 365 days.  ?  ? ? ?Kurt Reilly was given information about Chronic Care Management services today including:  ?CCM service includes personalized support from designated clinical staff supervised by his physician, including individualized plan of care and coordination with other care providers ?24/7 contact phone numbers for assistance for urgent and routine care needs. ?Standard insurance, coinsurance, copays and deductibles apply for chronic care management only during months in which we provide at least 20 minutes of these services. Most insurances cover these services at 100%, however patients may be responsible for any copay, coinsurance and/or deductible if applicable. This service may help you avoid the need for more expensive face-to-face services. ?Only one practitioner may furnish and bill the service in a calendar month. ?The patient may stop CCM services at any time (effective at the end of the month) by phone call to the office staff. ? ?Patient agreed to services and verbal consent obtained.  ? ?The patient verbalized understanding of instructions, educational materials, and care plan provided today and agreed to receive a mailed copy of patient instructions, educational materials, and care plan.  ?Telephone follow up appointment with pharmacy team member scheduled for: 1 year ? ?Edythe Clarity, [facility]  ?Beverly Milch, PharmD ?Clinical Pharmacist  ?[place] ?([phone] ? ?

## 2021-06-23 NOTE — Progress Notes (Signed)
Remote ICD transmission.   

## 2021-07-01 ENCOUNTER — Other Ambulatory Visit: Payer: Self-pay | Admitting: Interventional Cardiology

## 2021-07-03 DIAGNOSIS — I110 Hypertensive heart disease with heart failure: Secondary | ICD-10-CM

## 2021-07-03 DIAGNOSIS — E785 Hyperlipidemia, unspecified: Secondary | ICD-10-CM

## 2021-07-08 ENCOUNTER — Other Ambulatory Visit (HOSPITAL_COMMUNITY): Payer: Self-pay | Admitting: Internal Medicine

## 2021-08-03 ENCOUNTER — Telehealth: Payer: Self-pay | Admitting: Interventional Cardiology

## 2021-08-03 ENCOUNTER — Other Ambulatory Visit: Payer: Self-pay | Admitting: *Deleted

## 2021-08-03 DIAGNOSIS — M545 Low back pain, unspecified: Secondary | ICD-10-CM

## 2021-08-03 MED ORDER — CLOPIDOGREL BISULFATE 75 MG PO TABS: 75.0000 mg | ORAL_TABLET | Freq: Every day | ORAL | 2 refills | Status: DC

## 2021-08-03 MED ORDER — ALLOPURINOL 300 MG PO TABS: 300.0000 mg | ORAL_TABLET | Freq: Every day | ORAL | 1 refills | Status: DC

## 2021-08-03 NOTE — Telephone Encounter (Signed)
Pt c/o medication issue:  1. Name of Medication: clopidogrel (PLAVIX) 75 MG tablet  2. How are you currently taking this medication (dosage and times per day)? TAKE 1 TABLET BY MOUTH EVERY DAY  3. Are you having a reaction (difficulty breathing--STAT)? No   4. What is your medication issue? Patient needs a new 90 day prescription sent to  [facility] ([place], [place]

## 2021-08-03 NOTE — Telephone Encounter (Signed)
Pt's medication was sent to pt's pharmacy as requested. Confirmation received.  °

## 2021-08-22 ENCOUNTER — Other Ambulatory Visit: Payer: Self-pay | Admitting: Family Medicine

## 2021-08-22 DIAGNOSIS — G8929 Other chronic pain: Secondary | ICD-10-CM

## 2021-08-24 ENCOUNTER — Other Ambulatory Visit: Payer: Self-pay | Admitting: Interventional Cardiology

## 2021-08-28 ENCOUNTER — Other Ambulatory Visit: Payer: Self-pay | Admitting: Interventional Cardiology

## 2021-09-07 NOTE — Progress Notes (Signed)
Cardiology Office Note:    Date:  09/09/2021   ID:  Kurt Reilly, DOB August 11, 1950, MRN [id number]  PCP:  Inda Coke, PA  Cardiologist:  Sinclair Grooms, MD   Referring MD: Inda Coke, [place]   Chief Complaint  Patient presents with   Congestive Heart Failure   Coronary Artery Disease   Hyperlipidemia    History of Present Illness:    Kurt Reilly is a 71 y.o. male with a hx of CAD with anterior MI/cardiac arrest in May 2016 with 100% LAD with PCI - had associated cardiogenic shock/failure to wean - then had staged PCI to LCX and ostial LAD in June 2016 (total of 2 stents to the LAD) [facility], chronic systolic HF, ICM, underlying ICD (subcutaneous 2017), HLD, HTN and ED. EF was less than 35% -->improved to 40 to 45% --> most recent EF 25% (07/2019)   Finding of fatigue since starting Jardiance.  He is not short of breath.  He denies angina.  No orthopnea, PND, lower extremity swelling.  Furosemide has been discontinued.  Past Medical History:  Diagnosis Date   Actinic keratosis    Alkaline phosphatase raised    Anemia    Blood transfusion without reported diagnosis 2016   Cardiac arrhythmia    Cardiomyopathy [facility])    CHF (congestive heart failure) (HCC)    Coronary arteriosclerosis    ED (erectile dysfunction)    Gout 03/09/2017   Hemangioma 08/24/2018   Hyperlipidemia    Hypertension    Hypertrophic scar    Malignant neoplasm of prostate ([place]) 2013   Was told that less than 1% of cancer   Melanocytic nevi of trunk    Melanocytic nevus of skin    Senile hyperkeratosis    STEMI (ST elevation myocardial infarction) ([place]) 2016    Past Surgical History:  Procedure Laterality Date   ICD IMPLANT     PROSTATECTOMY  2013   SUBQ ICD CHANGEOUT N/A 06/07/2020   Procedure: SUBQ ICD CHANGEOUT;  Surgeon: Evans Lance, MD;  Location: [place] [facility];  Service: Cardiovascular;  Laterality: N/A;    Current Medications: Current Meds  Medication Sig    allopurinol (ZYLOPRIM) 300 MG tablet Take 1 tablet (300 mg total) by mouth daily.   aspirin 81 MG chewable tablet Chew 81 mg by mouth daily.   B Complex-C-Folic Acid (STRESS 485 B-COMPLEX PO) Take 1 tablet by mouth daily.   BLACK ELDERBERRY PO Take by mouth as needed.   carvedilol (COREG) 6.25 MG tablet TAKE 1 TABLET BY MOUTH  TWICE DAILY WITH MEALS   Cholecalciferol (VITAMIN D-3) 25 MCG (1000 UT) CAPS Take 1,000 Units by mouth daily.   clopidogrel (PLAVIX) 75 MG tablet Take 1 tablet (75 mg total) by mouth daily.   Colchicine 0.6 MG CAPS Take 0.6 mg by mouth daily as needed (gout).   empagliflozin (JARDIANCE) 10 MG TABS tablet Take 1 tablet (10 mg total) by mouth daily before breakfast.   ferrous sulfate 325 (65 FE) MG tablet Take 325 mg by mouth daily with breakfast.   fluticasone (FLONASE) 50 MCG/ACT nasal spray Place 1 spray into both nostrils daily as needed for allergies or rhinitis.   MILK THISTLE PO Take 525 mg by mouth every other day.    nitroGLYCERIN (NITROSTAT) 0.4 MG SL tablet PLACE 1 TABLET UNDER THE TONGUE EVERY 5 MINUTES AS NEEDED FOR CHEST PAIN   potassium chloride SA (KLOR-CON M20) 20 MEQ tablet TAKE 1 TABLET BY MOUTH ONLY  WHEN YOU TAKE THE TORSEMIDE   PRALUENT 150 MG/ML SOAJ INJECT '150MG'$  SUBCUTANEOUSLY EVERY 2 WEEKS   spironolactone (ALDACTONE) 25 MG tablet Take 0.5 tablets (12.5 mg total) by mouth daily.   [DISCONTINUED] ENTRESTO 49-51 MG TAKE 1 TABLET BY MOUTH TWICE A DAY   [DISCONTINUED] furosemide (LASIX) 20 MG tablet TAKE 1 TABLET BY MOUTH DAILY AS NEEDED FOR FLUID OR EDEMA.   [DISCONTINUED] losartan (COZAAR) 50 MG tablet Take 1 tablet by mouth daily in the afternoon.     Allergies:   Ezetimibe, Repatha [evolocumab], Statins, and Penicillins   Social History   Socioeconomic History   Marital status: Married    Spouse name: [name]   Number of children: 3   Years of education: Not on file   Highest education level: Not on file  Occupational History    Comment:  retired  Tobacco Use   Smoking status: Never   Smokeless tobacco: Never  Vaping Use   Vaping Use: Never used  Substance and Sexual Activity   Alcohol use: Yes    Alcohol/week: 2.0 standard drinks of alcohol    Types: 2 Glasses of wine per week    Comment: 2 glasses of wine per week   Drug use: Never   Sexual activity: Yes    Birth control/protection: None  Other Topics Concern   Not on file  Social History Narrative   Moved to [place] in Oct 2020 to be closer to grandchildren   Retired IT sales professional, Clinical biochemist, Training and development officer   Married   3 sons   Social Determinants of Health   Financial Resource Strain: [place]  (03/15/2021)   Overall Financial Resource Strain (CARDIA)    Difficulty of Paying Living Expenses: Not hard at all  Food Insecurity: No Food Insecurity (03/15/2021)   Hunger Vital Sign    Worried About Running Out of Food in the Last Year: Never true    [place] in the Last Year: Never true  Transportation Needs: No Transportation Needs (03/15/2021)   PRAPARE - Hydrologist (Medical): No    Lack of Transportation (Non-Medical): No  Physical Activity: Sufficiently Active (03/15/2021)   Exercise Vital Sign    Days of Exercise per Week: 6 days    Minutes of Exercise per Session: 50 min  Stress: No Stress Concern Present (03/15/2021)   [place]    Feeling of Stress : Not at all  Social Connections: Moderately Integrated (03/15/2021)   Social Connection and Isolation Panel [NHANES]    Frequency of Communication with Friends and Family: More than three times a week    Frequency of Social Gatherings with Friends and Family: Twice a week    Attends Religious Services: 1 to 4 times per year    Active Member of [employer] or Organizations: No    Attends Music therapist: Never    Marital Status: Married     Family History: The patient's family history  includes Bladder Cancer in his brother; Diabetes in his brother; High Cholesterol in his father; Hypertension in his father; Migraines in his son; Stomach cancer in his father; Yves Dill Parkinson White syndrome in his son. There is no history of Prostate cancer, Breast cancer, Colon cancer, or Pancreatic cancer.  ROS:   Please see the history of present illness.    Fatigue all other systems reviewed and are negative.  EKGs/Labs/Other Studies Reviewed:    The following studies were  reviewed today: 2D Doppler echocardiogram January 2023: IMPRESSIONS   1. 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 The LVEF slightly improved, was 25-30% now 30-35%. Left  ventricular ejection fraction, by estimation, is 30 to 35%. The left  ventricle has moderately decreased function.  The left ventricle has no regional wall motion abnormalities. Left  ventricular diastolic parameters are consistent with Grade I diastolic  dysfunction (impaired relaxation).   2. Right ventricular systolic function is normal. The right ventricular  size is normal. Tricuspid regurgitation signal is inadequate for assessing  PA pressure.   3. Left atrial size was mildly dilated.   4. The mitral valve is normal in structure. Mild mitral valve  regurgitation. No evidence of mitral stenosis.   5. The aortic valve is tricuspid. Aortic valve regurgitation is not  visualized. Aortic valve sclerosis is present, with no evidence of aortic  valve stenosis.   6. The inferior vena cava is normal in size with greater than 50%  respiratory variability, suggesting right atrial pressure of 3 mmHg.     EKG:  EKG not repeated  Recent Labs: 09/22/2020: NT-Pro BNP 494; TSH 1.130 05/23/2021: ALT 20; BUN 23; Creatinine, Ser 1.14; Hemoglobin 15.2; Platelets 206.0; Potassium 4.0; Sodium 139  Recent Lipid Panel    Component Value Date/Time   CHOL 124 05/23/2021 0901   CHOL 120 09/22/2020 1213   TRIG 272.0  (H) 05/23/2021 0901   HDL 39.90 05/23/2021 0901   HDL 40 09/22/2020 1213   CHOLHDL 3 05/23/2021 0901   VLDL 54.4 (H) 05/23/2021 0901   LDLCALC 48 09/22/2020 1213   LDLDIRECT 52.0 05/23/2021 0901    Physical Exam:    VS:  BP (!) 118/58   Pulse (!) 59   Ht '5\' 7"'$  (1.702 m)   Wt 161 lb 3.2 oz (73.1 kg)   SpO2 97%   BMI 25.25 kg/m     Wt Readings from Last 3 Encounters:  09/09/21 161 lb 3.2 oz (73.1 kg)  05/23/21 162 lb 3.2 oz (73.6 kg)  03/11/21 161 lb (73 kg)     GEN: Healthy appearing. No acute distress HEENT: Normal NECK: No JVD. LYMPHATICS: No lymphadenopathy CARDIAC: No murmur. RRR no gallop, or edema. VASCULAR:  Normal Pulses. No bruits. RESPIRATORY:  Clear to auscultation without rales, wheezing or rhonchi  ABDOMEN: Soft, non-tender, non-distended, No pulsatile mass, MUSCULOSKELETAL: No deformity  SKIN: Warm and dry NEUROLOGIC:  Alert and oriented x 3 PSYCHIATRIC:  Normal affect   ASSESSMENT:    1. Chronic systolic heart failure ([place])   2. Coronary artery disease involving native coronary artery of native heart without angina pectoris   3. ICD (implantable cardioverter-defibrillator) in place   4. Essential hypertension   5. Hyperlipidemia LDL goal <70   6. Cardiac arrest with ventricular fibrillation (HCC)    PLAN:    In order of problems listed above:  Continue guideline directed therapy of systolic heart failure.  Basic metabolic panel and BNP today.  He may be somewhat volume contracted.  If he is we will decrease spironolactone to 12.5 mg Monday, Wednesday, and Friday.  He needs clinical follow-up in 4 to 6 months.  We will repeat an echocardiogram sometime in 2024. Secondary prevention discussed Follow-up in device clinic Blood pressure is low normal Continue high intensity therapy for lipid control using Praluent ASCVD  Overall education and awareness concerning primary/secondary risk prevention was discussed in detail: LDL less than 70, hemoglobin  A1c  less than 7, blood pressure target less than 130/80 mmHg, >150 minutes of moderate aerobic activity per week, avoidance of smoking, weight control (via diet and exercise), and continued surveillance/management of/for obstructive sleep apnea.    Medication Adjustments/Labs and Tests Ordered: Current medicines are reviewed at length with the patient today.  Concerns regarding medicines are outlined above.  Orders Placed This Encounter  Procedures   Pro b natriuretic peptide (BNP)   CBC   Basic metabolic panel   Meds ordered this encounter  Medications   sacubitril-valsartan (ENTRESTO) 49-51 MG    Sig: Take 1 tablet by mouth 2 (two) times daily.    Dispense:  180 tablet    Refill:  3    Patient Instructions  Medication Instructions:  Your physician has recommended you make the following change in your medication:   1) STOP Lasix 2) STOP Losartan  *If you need a refill on your cardiac medications before your next appointment, please call your pharmacy*  Lab Work: TODAY: BNP, BMET, CBC If you have labs (blood work) drawn today and your tests are completely normal, you will receive your results only by: [place] (if you have MyChart) OR A paper copy in the mail If you have any lab test that is abnormal or we need to change your treatment, we will call you to review the results.  Testing/Procedures: NONE  Follow-Up: At [facility], you and your health needs are our priority.  As part of our continuing mission to provide you with exceptional heart care, we have created designated Provider Care Teams.  These Care Teams include your primary Cardiologist (physician) and Advanced Practice Providers (APPs -  Physician Assistants and Nurse Practitioners) who all work together to provide you with the care you need, when you need it.  Your next appointment:   4-6 month(s)  The format for your next appointment:   In Person  Provider:   Sinclair Grooms, MD  {   Important Information About Sugar         Signed, Sinclair Grooms, MD  09/09/2021 12:40 PM    [place]

## 2021-09-09 ENCOUNTER — Encounter: Payer: Self-pay | Admitting: Interventional Cardiology

## 2021-09-09 ENCOUNTER — Ambulatory Visit (INDEPENDENT_AMBULATORY_CARE_PROVIDER_SITE_OTHER): Payer: Medicare Other | Admitting: Interventional Cardiology

## 2021-09-09 VITALS — BP 118/58 | HR 59 | Ht 67.0 in | Wt 161.2 lb

## 2021-09-09 DIAGNOSIS — I10 Essential (primary) hypertension: Secondary | ICD-10-CM

## 2021-09-09 DIAGNOSIS — I2510 Atherosclerotic heart disease of native coronary artery without angina pectoris: Secondary | ICD-10-CM | POA: Diagnosis not present

## 2021-09-09 DIAGNOSIS — Z95810 Presence of automatic (implantable) cardiac defibrillator: Secondary | ICD-10-CM | POA: Diagnosis not present

## 2021-09-09 DIAGNOSIS — I5022 Chronic systolic (congestive) heart failure: Secondary | ICD-10-CM | POA: Diagnosis not present

## 2021-09-09 DIAGNOSIS — I4901 Ventricular fibrillation: Secondary | ICD-10-CM

## 2021-09-09 DIAGNOSIS — I255 Ischemic cardiomyopathy: Secondary | ICD-10-CM

## 2021-09-09 DIAGNOSIS — E785 Hyperlipidemia, unspecified: Secondary | ICD-10-CM

## 2021-09-09 DIAGNOSIS — I469 Cardiac arrest, cause unspecified: Secondary | ICD-10-CM

## 2021-09-09 MED ORDER — ENTRESTO 49-51 MG PO TABS
1.0000 | ORAL_TABLET | Freq: Two times a day (BID) | ORAL | 3 refills | Status: DC
Start: 2021-09-09 — End: 2022-05-24

## 2021-09-09 NOTE — Patient Instructions (Signed)
Medication Instructions:  Your physician has recommended you make the following change in your medication:   1) STOP Lasix 2) STOP Losartan  *If you need a refill on your cardiac medications before your next appointment, please call your pharmacy*  Lab Work: TODAY: BNP, BMET, CBC If you have labs (blood work) drawn today and your tests are completely normal, you will receive your results only by: [place] (if you have MyChart) OR A paper copy in the mail If you have any lab test that is abnormal or we need to change your treatment, we will call you to review the results.  Testing/Procedures: NONE  Follow-Up: At [facility], you and your health needs are our priority.  As part of our continuing mission to provide you with exceptional heart care, we have created designated Provider Care Teams.  These Care Teams include your primary Cardiologist (physician) and Advanced Practice Providers (APPs -  Physician Assistants and Nurse Practitioners) who all work together to provide you with the care you need, when you need it.  Your next appointment:   4-6 month(s)  The format for your next appointment:   In Person  Provider:   Sinclair Grooms, MD {   Important Information About Sugar

## 2021-09-10 LAB — BASIC METABOLIC PANEL
BUN/Creatinine Ratio: 18 (ref 10–24)
BUN: 24 mg/dL (ref 8–27)
CO2: 19 mmol/L — ABNORMAL LOW (ref 20–29)
Calcium: 9.4 mg/dL (ref 8.6–10.2)
Chloride: 105 mmol/L (ref 96–106)
Creatinine, Ser: 1.34 mg/dL — ABNORMAL HIGH (ref 0.76–1.27)
Glucose: 88 mg/dL (ref 70–99)
Potassium: 4.8 mmol/L (ref 3.5–5.2)
Sodium: 141 mmol/L (ref 134–144)
eGFR: 57 mL/min/{1.73_m2} — ABNORMAL LOW (ref >59)

## 2021-09-10 LAB — PRO B NATRIURETIC PEPTIDE: NT-Pro BNP: 459 pg/mL — ABNORMAL HIGH (ref 0–376)

## 2021-09-10 LAB — CBC
Hematocrit: 44.4 % (ref 37.5–51.0)
Hemoglobin: 15 g/dL (ref 13.0–17.7)
MCH: 33.5 pg — ABNORMAL HIGH (ref 26.6–33.0)
MCHC: 33.8 g/dL (ref 31.5–35.7)
MCV: 99 fL — ABNORMAL HIGH (ref 79–97)
Platelets: 261 10*3/uL (ref 150–450)
RBC: 4.48 x10E6/uL (ref 4.14–5.80)
RDW: 12.5 % (ref 11.6–15.4)
WBC: 5.8 10*3/uL (ref 3.4–10.8)

## 2021-09-12 ENCOUNTER — Encounter: Payer: Self-pay | Admitting: Interventional Cardiology

## 2021-09-12 MED ORDER — SPIRONOLACTONE 25 MG PO TABS: 12.5000 mg | ORAL_TABLET | ORAL | Status: DC

## 2021-10-18 ENCOUNTER — Telehealth: Payer: Self-pay | Admitting: Interventional Cardiology

## 2021-10-18 NOTE — Telephone Encounter (Signed)
Spoke to Kurt Reilly and remote transmission did not go through. States he will send in a little bit. Advised pt if we do not get it we will call him back. Appreciative of call.

## 2021-10-18 NOTE — Telephone Encounter (Signed)
Pt would like for nurse to return call for requested info that she needed regarding his last transmission on Heart Device. Please advise

## 2021-10-24 NOTE — Telephone Encounter (Signed)
Transmission received 10/24/2021.

## 2021-11-21 ENCOUNTER — Telehealth: Payer: Self-pay | Admitting: Pharmacist

## 2021-11-21 NOTE — Progress Notes (Unsigned)
Chronic Care Management Pharmacy Assistant   Name: Kurt Reilly  MRN: [id number] DOB: [birth year]   Reason for Encounter: General Adherence Call   Recent office visits:  None  Recent consult visits:  09/09/2021 OV (Cardiology) Belva Crome, MD; If he is we will decrease spironolactone to 12.5 mg  Hospital visits:  None in previous 6 months  Medications: Outpatient Encounter Medications as of 11/21/2021  Medication Sig   allopurinol (ZYLOPRIM) 300 MG tablet Take 1 tablet (300 mg total) by mouth daily.   aspirin 81 MG chewable tablet Chew 81 mg by mouth daily.   B Complex-C-Folic Acid (STRESS 030 B-COMPLEX PO) Take 1 tablet by mouth daily.   BLACK ELDERBERRY PO Take by mouth as needed.   carvedilol (COREG) 6.25 MG tablet TAKE 1 TABLET BY MOUTH  TWICE DAILY WITH MEALS   Cholecalciferol (VITAMIN D-3) 25 MCG (1000 UT) CAPS Take 1,000 Units by mouth daily.   clopidogrel (PLAVIX) 75 MG tablet Take 1 tablet (75 mg total) by mouth daily.   Colchicine 0.6 MG CAPS Take 0.6 mg by mouth daily as needed (gout).   empagliflozin (JARDIANCE) 10 MG TABS tablet Take 1 tablet (10 mg total) by mouth daily before breakfast.   ferrous sulfate 325 (65 FE) MG tablet Take 325 mg by mouth daily with breakfast.   fluticasone (FLONASE) 50 MCG/ACT nasal spray Place 1 spray into both nostrils daily as needed for allergies or rhinitis.   MILK THISTLE PO Take 525 mg by mouth every other day.    nitroGLYCERIN (NITROSTAT) 0.4 MG SL tablet PLACE 1 TABLET UNDER THE TONGUE EVERY 5 MINUTES AS NEEDED FOR CHEST PAIN   potassium chloride SA (KLOR-CON M20) 20 MEQ tablet TAKE 1 TABLET BY MOUTH ONLY WHEN YOU TAKE THE TORSEMIDE   PRALUENT 150 MG/ML SOAJ INJECT '150MG'$  SUBCUTANEOUSLY EVERY 2 WEEKS   sacubitril-valsartan (ENTRESTO) 49-51 MG Take 1 tablet by mouth 2 (two) times daily.   spironolactone (ALDACTONE) 25 MG tablet Take 0.5 tablets (12.5 mg total) by mouth 3 (three) times a week. Take on Mondays, Wednesdays, and  Fridays.   No facility-administered encounter medications on file as of 11/21/2021.   Reviewed chart prior to disease state call. Spoke with patient regarding BP  Recent Office Vitals: BP Readings from Last 3 Encounters:  09/09/21 (!) 118/58  05/23/21 120/60  03/11/21 98/62   Pulse Readings from Last 3 Encounters:  09/09/21 (!) 59  05/23/21 69  03/11/21 67    Wt Readings from Last 3 Encounters:  09/09/21 161 lb 3.2 oz (73.1 kg)  05/23/21 162 lb 3.2 oz (73.6 kg)  03/11/21 161 lb (73 kg)     Kidney Function Lab Results  Component Value Date/Time   CREATININE 1.34 (H) 09/09/2021 12:35 PM   CREATININE 1.14 05/23/2021 09:01 AM   GFR 65.96 05/23/2021 09:01 AM   GFRNONAA 61 11/25/2019 07:45 AM   GFRAA 70 11/25/2019 07:45 AM       Latest Ref Rng & Units 09/09/2021   12:35 PM 05/23/2021    9:01 AM 03/17/2021    4:11 PM  BMP  Glucose 70 - 99 mg/dL 88  111  96   BUN 8 - 27 mg/dL '24  23  22   '$ Creatinine 0.76 - 1.27 mg/dL 1.34  1.14  1.03   BUN/Creat Ratio 10 - '24 18   21   '$ Sodium 134 - 144 mmol/L 141  139  142   Potassium 3.5 - 5.2 mmol/L 4.8  4.0  4.2   Chloride 96 - 106 mmol/L 105  105  107   CO2 20 - 29 mmol/L '19  25  22   '$ Calcium 8.6 - 10.2 mg/dL 9.4  9.5  9.5     Current antihypertensive regimen:  Entresto 49-51 mg twice daily Spironolactone 12.5 mg three times a week  How often are you checking your Blood Pressure? daily  Current home BP readings: 104/60  What recent interventions/DTPs have been made by any provider to improve Blood Pressure control since last CPP Visit: decrease spironolactone to 12.5 mg  Any recent hospitalizations or ED visits since last visit with CPP? No  What diet changes have been made to improve Blood Pressure Control?  Patient states he eats a healthy diet.  What exercise is being done to improve your Blood Pressure Control?  Patient states he works out 45 minutes a day.  Adherence Review: Is the patient currently on ACE/ARB  medication? Yes Does the patient have >5 day gap between last estimated fill dates? Yes  Care Gaps: Medicare Annual Wellness: Due now, last AWV 10/17/2019 Hemoglobin A1C: none available Colonoscopy: Overdue since 03/04/2014  Future Appointments  Date Time Provider [place]  01/16/2022 11:40 AM Belva Crome, MD [facility]  01/23/2022  7:40 AM [facility] DEVICE REMOTES [facility]  04/24/2022  7:40 AM [facility] DEVICE REMOTES [facility]  06/19/2022  3:30 PM [facility] CCM PHARMACIST [facility]  07/24/2022  7:40 AM [facility] DEVICE REMOTES [facility]  10/23/2022  7:40 AM [facility] DEVICE REMOTES [facility]  01/22/2023  7:40 AM [facility] DEVICE REMOTES [facility]   Star Rating Drugs: Entresto 49-51 mg last filled 05/25/2021 90 DS - Patients states he is taking both medications daily Jardiance 10 mg last filled 04/06/2021 90 DS  April D Calhoun, [place] Pharmacist Assistant [phone]

## 2021-11-28 ENCOUNTER — Encounter: Payer: Self-pay | Admitting: *Deleted

## 2021-12-11 ENCOUNTER — Other Ambulatory Visit (HOSPITAL_COMMUNITY): Payer: Self-pay | Admitting: Internal Medicine

## 2021-12-25 ENCOUNTER — Other Ambulatory Visit: Payer: Self-pay | Admitting: Interventional Cardiology

## 2021-12-25 ENCOUNTER — Other Ambulatory Visit: Payer: Self-pay | Admitting: Physician Assistant

## 2021-12-25 DIAGNOSIS — G8929 Other chronic pain: Secondary | ICD-10-CM

## 2022-01-03 ENCOUNTER — Telehealth: Payer: Self-pay | Admitting: Interventional Cardiology

## 2022-01-03 NOTE — Telephone Encounter (Signed)
Patient is requesting a call back from Dr. Thompson Caul nurse concerning one of his medications. Patient did not go into detail with me, but states he just needs to discuss this with the nurse.

## 2022-01-03 NOTE — Telephone Encounter (Signed)
Left message to call office

## 2022-01-09 ENCOUNTER — Telehealth: Payer: Self-pay | Admitting: Pharmacist Clinician (PhC)/ Clinical Pharmacy Specialist

## 2022-01-09 NOTE — Telephone Encounter (Signed)
Need to reach out to patient/pharmacy to determine PDP benefit

## 2022-01-10 ENCOUNTER — Telehealth: Payer: Self-pay | Admitting: Interventional Cardiology

## 2022-01-10 NOTE — Telephone Encounter (Signed)
Patient is requesting to speak directly with Dr. Thompson Caul nurse. He declined discussing with me stating he would prefer to speak directly with the nurse.

## 2022-01-10 NOTE — Telephone Encounter (Signed)
Returned call to patient.  Patient states this morning he was walking uphill and about halfway up the incline he began feeling what he describes as heartburn between his bellybutton and bottom of his ribcage. He sat down and rested, drank some water and symptoms resolved. He states this happened last week in a similar scenario, and again this afternoon when he was walking on an incline on his treadmill.  Patient denies CP, SOB, N/V, or dizziness. He wasn't sure if this could be heart-related or just heartburn. Informed patient this sounds unrelated to his heart, but I will forward this message to Dr. Tamala Julian to review.  Patient has appointment with Dr. Tamala Julian on 01/16/22.  Advised on ED precautions, patient verbalized understanding.

## 2022-01-14 NOTE — H&P (View-Only) (Signed)
Cardiology Office Note:    Date:  01/16/2022   ID:  Kurt Reilly, DOB [birth year], MRN [id number]  PCP:  Inda Coke, PA  Cardiologist:  Sinclair Grooms, MD   Referring MD: Inda Coke, [place]   Chief Complaint  Patient presents with   Coronary Artery Disease    Recurrent angina    History of Present Illness:    Kurt Reilly is a 71 y.o. male with a hx of  CAD with anterior MI/cardiac arrest in May 2016 with 100% LAD with PCI - had associated cardiogenic shock/failure to wean - then had staged PCI to LCX and ostial LAD in June 2016 (total of 2 stents to the LAD) [facility], Cirrc and LAD stents 0093, chronic systolic HF, ICM, underlying ICD (subcutaneous 2017), HLD, HTN and ED. EF was less than 35% -->improved to 40 to 45% --> most recent EF 25% (07/2019) --> LVEF 35% (02/2021).  He has recently experienced chest discomfort with walking up incline.  This has been progressive over the past 2 months.  He can walk up and down stairs without a problem.  He is not experience discomfort at rest.  He has never needed to use a nitroglycerin.  He denies orthopnea and PND.  I looked at all of his stent cards and he has had at least 5 stents deployed in the LAD and 2 in the circumflex.  Whether they overlap or not is unclear.  Most recent stent appointments were in 2017.  In sizes range between 3.0-4.0 with the great majority being 3.5 diameter.   Denies claudication.  No neurological symptoms or bleeding.  Current antiplatelet regimen is clopidogrel and aspirin.  Past Medical History:  Diagnosis Date   Actinic keratosis    Alkaline phosphatase raised    Anemia    Blood transfusion without reported diagnosis 2016   Cardiac arrhythmia    Cardiomyopathy [facility])    CHF (congestive heart failure) (HCC)    Coronary arteriosclerosis    ED (erectile dysfunction)    Gout 03/09/2017   Hemangioma 08/24/2018   Hyperlipidemia    Hypertension    Hypertrophic scar    Malignant neoplasm of  prostate ([place]) 2013   Was told that less than 1% of cancer   Melanocytic nevi of trunk    Melanocytic nevus of skin    Senile hyperkeratosis    STEMI (ST elevation myocardial infarction) ([place]) 2016    Past Surgical History:  Procedure Laterality Date   ICD IMPLANT     PROSTATECTOMY  2013   SUBQ ICD CHANGEOUT N/A 06/07/2020   Procedure: SUBQ ICD CHANGEOUT;  Surgeon: Evans Lance, MD;  Location: [facility];  Service: Cardiovascular;  Laterality: N/A;    Current Medications: Current Meds  Medication Sig   allopurinol (ZYLOPRIM) 300 MG tablet TAKE 1 TABLET BY MOUTH DAILY FOR GOUT   aspirin 81 MG chewable tablet Chew 81 mg by mouth daily.   B Complex-C-Folic Acid (STRESS 818 B-COMPLEX PO) Take 1 tablet by mouth daily.   BLACK ELDERBERRY PO Take by mouth as needed.   carvedilol (COREG) 6.25 MG tablet TAKE 1 TABLET BY MOUTH TWICE  DAILY WITH MEALS   Cholecalciferol (VITAMIN D-3) 25 MCG (1000 UT) CAPS Take 1,000 Units by mouth daily.   clopidogrel (PLAVIX) 75 MG tablet Take 1 tablet (75 mg total) by mouth daily.   Colchicine 0.6 MG CAPS Take 0.6 mg by mouth daily as needed (gout).   ferrous sulfate 325 (65  FE) MG tablet Take 325 mg by mouth daily with breakfast.   fluticasone (FLONASE) 50 MCG/ACT nasal spray Place 1 spray into both nostrils daily as needed for allergies or rhinitis.   JARDIANCE 10 MG TABS tablet TAKE 1 TABLET BY MOUTH DAILY  BEFORE BREAKFAST   MILK THISTLE PO Take 525 mg by mouth every other day.    nitroGLYCERIN (NITROSTAT) 0.4 MG SL tablet PLACE 1 TABLET UNDER THE TONGUE EVERY 5 MINUTES AS NEEDED FOR CHEST PAIN   potassium chloride SA (KLOR-CON M20) 20 MEQ tablet TAKE 1 TABLET BY MOUTH ONLY WHEN YOU TAKE THE TORSEMIDE   PRALUENT 150 MG/ML SOAJ INJECT '150MG'$  SUBCUTANEOUSLY EVERY 2 WEEKS   sacubitril-valsartan (ENTRESTO) 49-51 MG Take 1 tablet by mouth 2 (two) times daily.   spironolactone (ALDACTONE) 25 MG tablet Take 0.5 tablets (12.5 mg total) by mouth 3  (three) times a week. Take on Mondays, Wednesdays, and Fridays.     Allergies:   Ezetimibe, Repatha [evolocumab], Statins, and Penicillins   Social History   Socioeconomic History   Marital status: Married    Spouse name: [name]   Number of children: 3   Years of education: Not on file   Highest education level: Not on file  Occupational History    Comment: retired  Tobacco Use   Smoking status: Never   Smokeless tobacco: Never  Vaping Use   Vaping Use: Never used  Substance and Sexual Activity   Alcohol use: Yes    Alcohol/week: 2.0 standard drinks of alcohol    Types: 2 Glasses of wine per week    Comment: 2 glasses of wine per week   Drug use: Never   Sexual activity: Yes    Birth control/protection: None  Other Topics Concern   Not on file  Social History Narrative   Moved to [place] in Oct 2020 to be closer to grandchildren   Retired IT sales professional, Clinical biochemist, Training and development officer   Married   3 sons   Social Determinants of Health   Financial Resource Strain: [place]  (03/15/2021)   Overall Financial Resource Strain (CARDIA)    Difficulty of Paying Living Expenses: Not hard at all  Food Insecurity: No Food Insecurity (03/15/2021)   Hunger Vital Sign    Worried About Running Out of Food in the Last Year: Never true    [place] in the Last Year: Never true  Transportation Needs: No Transportation Needs (03/15/2021)   PRAPARE - Hydrologist (Medical): No    Lack of Transportation (Non-Medical): No  Physical Activity: Sufficiently Active (03/15/2021)   Exercise Vital Sign    Days of Exercise per Week: 6 days    Minutes of Exercise per Session: 50 min  Stress: No Stress Concern Present (03/15/2021)   [place]    Feeling of Stress : Not at all  Social Connections: Moderately Integrated (03/15/2021)   Social Connection and Isolation Panel [NHANES]    Frequency of  Communication with Friends and Family: More than three times a week    Frequency of Social Gatherings with Friends and Family: Twice a week    Attends Religious Services: 1 to 4 times per year    Active Member of [employer] or Organizations: No    Attends Archivist Meetings: Never    Marital Status: Married     Family History: The patient's family history includes Bladder Cancer in his brother; Diabetes in  his brother; High Cholesterol in his father; Hypertension in his father; Migraines in his son; Stomach cancer in his father; Yves Dill Parkinson White syndrome in his son. There is no history of Prostate cancer, Breast cancer, Colon cancer, or Pancreatic cancer.  ROS:   Please see the history of present illness.    Otherwise feels well.  Has several activities going on to his home where he is building or renovating structures.  He is able to carry lumber and go up and down stairs without symptoms.  Symptoms occur only with protracted walking when going up an incline.  All other systems reviewed and are negative.  EKGs/Labs/Other Studies Reviewed:    The following studies were reviewed today:  2D Doppler echocardiogram December 2022: IMPRESSIONS   1. 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 The LVEF slightly improved, was 25-30% now 30-35%. Left  ventricular ejection fraction, by estimation, is 30 to 35%. The left  ventricle has moderately decreased function.  The left ventricle has no regional wall motion abnormalities. Left  ventricular diastolic parameters are consistent with Grade I diastolic  dysfunction (impaired relaxation).   2. Right ventricular systolic function is normal. The right ventricular  size is normal. Tricuspid regurgitation signal is inadequate for assessing  PA pressure.   3. Left atrial size was mildly dilated.   4. The mitral valve is normal in structure. Mild mitral valve  regurgitation. No evidence of mitral  stenosis.   5. The aortic valve is tricuspid. Aortic valve regurgitation is not  visualized. Aortic valve sclerosis is present, with no evidence of aortic  valve stenosis.   6. The inferior vena cava is normal in size with greater than 50%  respiratory variability, suggesting right atrial pressure of 3 mmHg.    EKG:  EKG sinus bradycardia, poor R wave progression V1 through V3, T wave flattening, when compared to the tracing from October 2022.  Recent Labs: 05/23/2021: ALT 20 09/09/2021: BUN 24; Creatinine, Ser 1.34; Hemoglobin 15.0; NT-Pro BNP 459; Platelets 261; Potassium 4.8; Sodium 141  Recent Lipid Panel    Component Value Date/Time   CHOL 124 05/23/2021 0901   CHOL 120 09/22/2020 1213   TRIG 272.0 (H) 05/23/2021 0901   HDL 39.90 05/23/2021 0901   HDL 40 09/22/2020 1213   CHOLHDL 3 05/23/2021 0901   VLDL 54.4 (H) 05/23/2021 0901   LDLCALC 48 09/22/2020 1213   LDLDIRECT 52.0 05/23/2021 0901    Physical Exam:    VS:  BP 116/68   Pulse (!) 56   Ht '5\' 7"'$  (1.702 m)   Wt 163 lb 3.2 oz (74 kg)   SpO2 97%   BMI 25.56 kg/m     Wt Readings from Last 3 Encounters:  01/16/22 163 lb 3.2 oz (74 kg)  09/09/21 161 lb 3.2 oz (73.1 kg)  05/23/21 162 lb 3.2 oz (73.6 kg)     GEN: Appears younger than stated age. No acute distress HEENT: Normal NECK: No JVD. LYMPHATICS: No lymphadenopathy CARDIAC: No murmur. RRR S4 but no S3 gallop, or edema. VASCULAR:  Normal Pulses in radials, carotids, and posterior tibial positions bilaterally.. No bruits. RESPIRATORY:  Clear to auscultation without rales, wheezing or rhonchi  ABDOMEN: Soft, non-tender, non-distended, No pulsatile mass, MUSCULOSKELETAL: No deformity  SKIN: Warm and dry NEUROLOGIC:  Alert and oriented x 3 PSYCHIATRIC:  Normal affect   ASSESSMENT:    1. Coronary artery disease involving native coronary artery of native heart with  other form of angina pectoris ([place])   2. Chronic systolic heart failure ([place])   3. ICD  (implantable cardioverter-defibrillator) in place   4. Essential hypertension   5. Hyperlipidemia LDL goal <70   6. Pre-procedure lab exam    PLAN:    In order of problems listed above:  Exertional angina, progressive over 2 months.  Multiple stents, some overlapping, suspect in-stent restenosis in LAD, rule out circumflex in-stent restenosis.  Rule out de novo disease in the right coronary.  He is instructed to undergo coronary angiography to help define anatomy and guide therapy.  We considered uptitrating anti-ischemic therapy, however under the circumstances identification of anatomy seems the most prudent approach. Continue guideline directed therapy for heart failure including carvedilol, Jardiance, Entresto, and spironolactone. Followed in device clinic Blood pressure is adequate Continue Praluent and LDL cholesterol target less than 55. The patient was counseled to undergo left heart catheterization, coronary angiography, and possible percutaneous coronary intervention with stent implantation. The procedural risks and benefits were discussed in detail. The risks discussed included death, stroke, myocardial infarction, life-threatening bleeding, limb ischemia, kidney injury, allergy, and possible emergency cardiac surgery. The risk of these significant complications were estimated to occur less than 1% of the time. After discussion, the patient has agreed to proceed.    Medication Adjustments/Labs and Tests Ordered: Current medicines are reviewed at length with the patient today.  Concerns regarding medicines are outlined above.  Orders Placed This Encounter  Procedures   CBC   Basic metabolic panel   EKG 76-OTLX   No orders of the defined types were placed in this encounter.   Patient Instructions  Medication Instructions:  Your physician recommends that you continue on your current medications as directed. Please refer to the Current Medication list given to you today.  *If  you need a refill on your cardiac medications before your next appointment, please call your pharmacy*  Lab Work: TODAY: CBC, BMET If you have labs (blood work) drawn today and your tests are completely normal, you will receive your results only by: [place] (if you have MyChart) OR A paper copy in the mail If you have any lab test that is abnormal or we need to change your treatment, we will call you to review the results.  Testing/Procedures: Your physician has recommended you have a left heart catheterization performed. Please see instructions below.  Follow-Up: At [facility], you and your health needs are our priority.  As part of our continuing mission to provide you with exceptional heart care, we have created designated Provider Care Teams.  These Care Teams include your primary Cardiologist (physician) and Advanced Practice Providers (APPs -  Physician Assistants and Nurse Practitioners) who all work together to provide you with the care you need, when you need it.  Your next appointment:   3-4 week(s)  The format for your next appointment:   In Person  Provider:   Sinclair Grooms, MD  or Nicholes Rough, PA-C, Melina Copa, PA-C, Ambrose Pancoast, NP, Christen Bame, NP, or Richardson Dopp, PA-C   Other Instructions       Cardiac/Peripheral Catheterization   You are scheduled for a Cardiac Catheterization on Wednesday, November 15 with Dr. Daneen Schick.  1. Please arrive at the Main Entrance A at [facility]: [place], [place] [address] on November 15 at 12:00 PM (This time is two hours before your procedure to ensure your preparation). Free valet parking service is available.  You will check in at ADMITTING. The support person will be asked to wait in the waiting room.  It is OK to have someone drop you off and come back when you are ready to be discharged.       Special note: Every effort is made to have your procedure done on time. Please  understand that emergencies sometimes delay scheduled procedures.  2. Diet: Do not eat solid foods after midnight.  You may have clear liquids until 5 AM the day of the procedure.  3. Medication instructions in preparation for your procedure:   Contrast Allergy: No  Stop taking, spironolactone, Entresto, and Jardiance on the morning of Wednesday, November 15,  On the morning of your procedure, take Aspirin 81 mg and Plavix/Clopidogrel and any morning medicines NOT listed above.  You may use sips of water.  4. Plan to go home the same day, you will only stay overnight if medically necessary. 5. You MUST have a responsible adult to drive you home. 6. An adult MUST be with you the first 24 hours after you arrive home. 7. Bring a current list of your medications, and the last time and date medication taken. 8. Bring ID and current insurance cards. 9.Please wear clothes that are easy to get on and off and wear slip-on shoes.  Thank you for allowing [place] to care for you!   -- [place] [facility]   Important Information About Sugar         Signed, Sinclair Grooms, MD  01/16/2022 12:32 PM    [place]

## 2022-01-14 NOTE — Progress Notes (Unsigned)
Cardiology Office Note:    Date:  01/16/2022   ID:  Kurt Reilly, DOB 1950-10-09, MRN [id number]  PCP:  Inda Coke, PA  Cardiologist:  Sinclair Grooms, MD   Referring MD: Inda Coke, [place]   Chief Complaint  Patient presents with   Coronary Artery Disease    Recurrent angina    History of Present Illness:    Kurt Reilly is a 71 y.o. male with a hx of  CAD with anterior MI/cardiac arrest in May 2016 with 100% LAD with PCI - had associated cardiogenic shock/failure to wean - then had staged PCI to LCX and ostial LAD in June 2016 (total of 2 stents to the LAD) [facility], [facility] and LAD stents 5170, chronic systolic HF, ICM, underlying ICD (subcutaneous 2017), HLD, HTN and ED. EF was less than 35% -->improved to 40 to 45% --> most recent EF 25% (07/2019) --> LVEF 35% (02/2021).  He has recently experienced chest discomfort with walking up incline.  This has been progressive over the past 2 months.  He can walk up and down stairs without a problem.  He is not experience discomfort at rest.  He has never needed to use a nitroglycerin.  He denies orthopnea and PND.  I looked at all of his stent cards and he has had at least 5 stents deployed in the LAD and 2 in the circumflex.  Whether they overlap or not is unclear.  Most recent stent appointments were in 2017.  In sizes range between 3.0-4.0 with the great majority being 3.5 diameter.   Denies claudication.  No neurological symptoms or bleeding.  Current antiplatelet regimen is clopidogrel and aspirin.  Past Medical History:  Diagnosis Date   Actinic keratosis    Alkaline phosphatase raised    Anemia    Blood transfusion without reported diagnosis 2016   Cardiac arrhythmia    Cardiomyopathy [facility])    CHF (congestive heart failure) (HCC)    Coronary arteriosclerosis    ED (erectile dysfunction)    Gout 03/09/2017   Hemangioma 08/24/2018   Hyperlipidemia    Hypertension    Hypertrophic scar    Malignant neoplasm of  prostate ([place]) 2013   Was told that less than 1% of cancer   Melanocytic nevi of trunk    Melanocytic nevus of skin    Senile hyperkeratosis    STEMI (ST elevation myocardial infarction) ([place]) 2016    Past Surgical History:  Procedure Laterality Date   ICD IMPLANT     PROSTATECTOMY  2013   SUBQ ICD CHANGEOUT N/A 06/07/2020   Procedure: SUBQ ICD CHANGEOUT;  Surgeon: Evans Lance, MD;  Location: [place] [facility];  Service: Cardiovascular;  Laterality: N/A;    Current Medications: Current Meds  Medication Sig   allopurinol (ZYLOPRIM) 300 MG tablet TAKE 1 TABLET BY MOUTH DAILY FOR GOUT   aspirin 81 MG chewable tablet Chew 81 mg by mouth daily.   B Complex-C-Folic Acid (STRESS 017 B-COMPLEX PO) Take 1 tablet by mouth daily.   BLACK ELDERBERRY PO Take by mouth as needed.   carvedilol (COREG) 6.25 MG tablet TAKE 1 TABLET BY MOUTH TWICE  DAILY WITH MEALS   Cholecalciferol (VITAMIN D-3) 25 MCG (1000 UT) CAPS Take 1,000 Units by mouth daily.   clopidogrel (PLAVIX) 75 MG tablet Take 1 tablet (75 mg total) by mouth daily.   Colchicine 0.6 MG CAPS Take 0.6 mg by mouth daily as needed (gout).   ferrous sulfate 325 (65  FE) MG tablet Take 325 mg by mouth daily with breakfast.   fluticasone (FLONASE) 50 MCG/ACT nasal spray Place 1 spray into both nostrils daily as needed for allergies or rhinitis.   JARDIANCE 10 MG TABS tablet TAKE 1 TABLET BY MOUTH DAILY  BEFORE BREAKFAST   MILK THISTLE PO Take 525 mg by mouth every other day.    nitroGLYCERIN (NITROSTAT) 0.4 MG SL tablet PLACE 1 TABLET UNDER THE TONGUE EVERY 5 MINUTES AS NEEDED FOR CHEST PAIN   potassium chloride SA (KLOR-CON M20) 20 MEQ tablet TAKE 1 TABLET BY MOUTH ONLY WHEN YOU TAKE THE TORSEMIDE   PRALUENT 150 MG/ML SOAJ INJECT '150MG'$  SUBCUTANEOUSLY EVERY 2 WEEKS   sacubitril-valsartan (ENTRESTO) 49-51 MG Take 1 tablet by mouth 2 (two) times daily.   spironolactone (ALDACTONE) 25 MG tablet Take 0.5 tablets (12.5 mg total) by mouth 3  (three) times a week. Take on Mondays, Wednesdays, and Fridays.     Allergies:   Ezetimibe, Repatha [evolocumab], Statins, and Penicillins   Social History   Socioeconomic History   Marital status: Married    Spouse name: [name]   Number of children: 3   Years of education: Not on file   Highest education level: Not on file  Occupational History    Comment: retired  Tobacco Use   Smoking status: Never   Smokeless tobacco: Never  Vaping Use   Vaping Use: Never used  Substance and Sexual Activity   Alcohol use: Yes    Alcohol/week: 2.0 standard drinks of alcohol    Types: 2 Glasses of wine per week    Comment: 2 glasses of wine per week   Drug use: Never   Sexual activity: Yes    Birth control/protection: None  Other Topics Concern   Not on file  Social History Narrative   Moved to [place] in Oct 2020 to be closer to grandchildren   Retired IT sales professional, Clinical biochemist, Training and development officer   Married   3 sons   Social Determinants of Health   Financial Resource Strain: Happy  (03/15/2021)   Overall Financial Resource Strain (CARDIA)    Difficulty of Paying Living Expenses: Not hard at all  Food Insecurity: No Food Insecurity (03/15/2021)   Hunger Vital Sign    Worried About Running Out of Food in the Last Year: Never true    [place] in the Last Year: Never true  Transportation Needs: No Transportation Needs (03/15/2021)   PRAPARE - Hydrologist (Medical): No    Lack of Transportation (Non-Medical): No  Physical Activity: Sufficiently Active (03/15/2021)   Exercise Vital Sign    Days of Exercise per Week: 6 days    Minutes of Exercise per Session: 50 min  Stress: No Stress Concern Present (03/15/2021)   [place]    Feeling of Stress : Not at all  Social Connections: Moderately Integrated (03/15/2021)   Social Connection and Isolation Panel [NHANES]    Frequency of  Communication with Friends and Family: More than three times a week    Frequency of Social Gatherings with Friends and Family: Twice a week    Attends Religious Services: 1 to 4 times per year    Active Member of [employer] or Organizations: No    Attends Archivist Meetings: Never    Marital Status: Married     Family History: The patient's family history includes Bladder Cancer in his brother; Diabetes in  his brother; High Cholesterol in his father; Hypertension in his father; Migraines in his son; Stomach cancer in his father; Yves Dill Parkinson White syndrome in his son. There is no history of Prostate cancer, Breast cancer, Colon cancer, or Pancreatic cancer.  ROS:   Please see the history of present illness.    Otherwise feels well.  Has several activities going on to his home where he is building or renovating structures.  He is able to carry lumber and go up and down stairs without symptoms.  Symptoms occur only with protracted walking when going up an incline.  All other systems reviewed and are negative.  EKGs/Labs/Other Studies Reviewed:    The following studies were reviewed today:  2D Doppler echocardiogram December 2022: IMPRESSIONS   1. 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 The LVEF slightly improved, was 25-30% now 30-35%. Left  ventricular ejection fraction, by estimation, is 30 to 35%. The left  ventricle has moderately decreased function.  The left ventricle has no regional wall motion abnormalities. Left  ventricular diastolic parameters are consistent with Grade I diastolic  dysfunction (impaired relaxation).   2. Right ventricular systolic function is normal. The right ventricular  size is normal. Tricuspid regurgitation signal is inadequate for assessing  PA pressure.   3. Left atrial size was mildly dilated.   4. The mitral valve is normal in structure. Mild mitral valve  regurgitation. No evidence of mitral  stenosis.   5. The aortic valve is tricuspid. Aortic valve regurgitation is not  visualized. Aortic valve sclerosis is present, with no evidence of aortic  valve stenosis.   6. The inferior vena cava is normal in size with greater than 50%  respiratory variability, suggesting right atrial pressure of 3 mmHg.    EKG:  EKG sinus bradycardia, poor R wave progression V1 through V3, T wave flattening, when compared to the tracing from October 2022.  Recent Labs: 05/23/2021: ALT 20 09/09/2021: BUN 24; Creatinine, Ser 1.34; Hemoglobin 15.0; NT-Pro BNP 459; Platelets 261; Potassium 4.8; Sodium 141  Recent Lipid Panel    Component Value Date/Time   CHOL 124 05/23/2021 0901   CHOL 120 09/22/2020 1213   TRIG 272.0 (H) 05/23/2021 0901   HDL 39.90 05/23/2021 0901   HDL 40 09/22/2020 1213   CHOLHDL 3 05/23/2021 0901   VLDL 54.4 (H) 05/23/2021 0901   LDLCALC 48 09/22/2020 1213   LDLDIRECT 52.0 05/23/2021 0901    Physical Exam:    VS:  BP 116/68   Pulse (!) 56   Ht '5\' 7"'$  (1.702 m)   Wt 163 lb 3.2 oz (74 kg)   SpO2 97%   BMI 25.56 kg/m     Wt Readings from Last 3 Encounters:  01/16/22 163 lb 3.2 oz (74 kg)  09/09/21 161 lb 3.2 oz (73.1 kg)  05/23/21 162 lb 3.2 oz (73.6 kg)     GEN: Appears younger than stated age. No acute distress HEENT: Normal NECK: No JVD. LYMPHATICS: No lymphadenopathy CARDIAC: No murmur. RRR S4 but no S3 gallop, or edema. VASCULAR:  Normal Pulses in radials, carotids, and posterior tibial positions bilaterally.. No bruits. RESPIRATORY:  Clear to auscultation without rales, wheezing or rhonchi  ABDOMEN: Soft, non-tender, non-distended, No pulsatile mass, MUSCULOSKELETAL: No deformity  SKIN: Warm and dry NEUROLOGIC:  Alert and oriented x 3 PSYCHIATRIC:  Normal affect   ASSESSMENT:    1. Coronary artery disease involving native coronary artery of native heart with  other form of angina pectoris ([place])   2. Chronic systolic heart failure ([place])   3. ICD  (implantable cardioverter-defibrillator) in place   4. Essential hypertension   5. Hyperlipidemia LDL goal <70   6. Pre-procedure lab exam    PLAN:    In order of problems listed above:  Exertional angina, progressive over 2 months.  Multiple stents, some overlapping, suspect in-stent restenosis in LAD, rule out circumflex in-stent restenosis.  Rule out de novo disease in the right coronary.  He is instructed to undergo coronary angiography to help define anatomy and guide therapy.  We considered uptitrating anti-ischemic therapy, however under the circumstances identification of anatomy seems the most prudent approach. Continue guideline directed therapy for heart failure including carvedilol, Jardiance, Entresto, and spironolactone. Followed in device clinic Blood pressure is adequate Continue Praluent and LDL cholesterol target less than 55. The patient was counseled to undergo left heart catheterization, coronary angiography, and possible percutaneous coronary intervention with stent implantation. The procedural risks and benefits were discussed in detail. The risks discussed included death, stroke, myocardial infarction, life-threatening bleeding, limb ischemia, kidney injury, allergy, and possible emergency cardiac surgery. The risk of these significant complications were estimated to occur less than 1% of the time. After discussion, the patient has agreed to proceed.    Medication Adjustments/Labs and Tests Ordered: Current medicines are reviewed at length with the patient today.  Concerns regarding medicines are outlined above.  Orders Placed This Encounter  Procedures   CBC   Basic metabolic panel   EKG 26-STMH   No orders of the defined types were placed in this encounter.   Patient Instructions  Medication Instructions:  Your physician recommends that you continue on your current medications as directed. Please refer to the Current Medication list given to you today.  *If  you need a refill on your cardiac medications before your next appointment, please call your pharmacy*  Lab Work: TODAY: CBC, BMET If you have labs (blood work) drawn today and your tests are completely normal, you will receive your results only by: [place] (if you have MyChart) OR A paper copy in the mail If you have any lab test that is abnormal or we need to change your treatment, we will call you to review the results.  Testing/Procedures: Your physician has recommended you have a left heart catheterization performed. Please see instructions below.  Follow-Up: At [facility], you and your health needs are our priority.  As part of our continuing mission to provide you with exceptional heart care, we have created designated Provider Care Teams.  These Care Teams include your primary Cardiologist (physician) and Advanced Practice Providers (APPs -  Physician Assistants and Nurse Practitioners) who all work together to provide you with the care you need, when you need it.  Your next appointment:   3-4 week(s)  The format for your next appointment:   In Person  Provider:   Sinclair Grooms, MD  or Nicholes Rough, PA-C, Melina Copa, PA-C, Ambrose Pancoast, NP, Christen Bame, NP, or [place] Dopp, PA-C   Other Instructions       Cardiac/Peripheral Catheterization   You are scheduled for a Cardiac Catheterization on Wednesday, November 15 with Dr. Daneen Schick.  1. Please arrive at the Main Entrance A at [facility]: [place], [place] [address] on November 15 at 12:00 PM (This time is two hours before your procedure to ensure your preparation). Free valet parking service is available.  You will check in at ADMITTING. The support person will be asked to wait in the waiting room.  It is OK to have someone drop you off and come back when you are ready to be discharged.       Special note: Every effort is made to have your procedure done on time. Please  understand that emergencies sometimes delay scheduled procedures.  2. Diet: Do not eat solid foods after midnight.  You may have clear liquids until 5 AM the day of the procedure.  3. Medication instructions in preparation for your procedure:   Contrast Allergy: No  Stop taking, spironolactone, Entresto, and Jardiance on the morning of Wednesday, November 15,  On the morning of your procedure, take Aspirin 81 mg and Plavix/Clopidogrel and any morning medicines NOT listed above.  You may use sips of water.  4. Plan to go home the same day, you will only stay overnight if medically necessary. 5. You MUST have a responsible adult to drive you home. 6. An adult MUST be with you the first 24 hours after you arrive home. 7. Bring a current list of your medications, and the last time and date medication taken. 8. Bring ID and current insurance cards. 9.Please wear clothes that are easy to get on and off and wear slip-on shoes.  Thank you for allowing [place] to care for you!   -- [facility]   Important Information About Sugar         Signed, Sinclair Grooms, MD  01/16/2022 12:32 PM    [place]

## 2022-01-16 ENCOUNTER — Ambulatory Visit: Payer: Medicare Other | Attending: Interventional Cardiology | Admitting: Interventional Cardiology

## 2022-01-16 ENCOUNTER — Encounter: Payer: Self-pay | Admitting: Interventional Cardiology

## 2022-01-16 VITALS — BP 116/68 | HR 56 | Ht 67.0 in | Wt 163.2 lb

## 2022-01-16 DIAGNOSIS — E785 Hyperlipidemia, unspecified: Secondary | ICD-10-CM | POA: Diagnosis present

## 2022-01-16 DIAGNOSIS — I10 Essential (primary) hypertension: Secondary | ICD-10-CM

## 2022-01-16 DIAGNOSIS — Z01812 Encounter for preprocedural laboratory examination: Secondary | ICD-10-CM

## 2022-01-16 DIAGNOSIS — I5022 Chronic systolic (congestive) heart failure: Secondary | ICD-10-CM | POA: Diagnosis not present

## 2022-01-16 DIAGNOSIS — I255 Ischemic cardiomyopathy: Secondary | ICD-10-CM

## 2022-01-16 DIAGNOSIS — I25118 Atherosclerotic heart disease of native coronary artery with other forms of angina pectoris: Secondary | ICD-10-CM | POA: Diagnosis not present

## 2022-01-16 DIAGNOSIS — Z95810 Presence of automatic (implantable) cardiac defibrillator: Secondary | ICD-10-CM | POA: Diagnosis not present

## 2022-01-16 LAB — BASIC METABOLIC PANEL
BUN/Creatinine Ratio: 18 (ref 10–24)
BUN: 21 mg/dL (ref 8–27)
CO2: 25 mmol/L (ref 20–29)
Calcium: 9.5 mg/dL (ref 8.6–10.2)
Chloride: 102 mmol/L (ref 96–106)
Creatinine, Ser: 1.17 mg/dL (ref 0.76–1.27)
Glucose: 94 mg/dL (ref 70–99)
Potassium: 4.4 mmol/L (ref 3.5–5.2)
Sodium: 139 mmol/L (ref 134–144)
eGFR: 67 mL/min/{1.73_m2} (ref >59)

## 2022-01-16 LAB — CBC
Hematocrit: 43.7 % (ref 37.5–51.0)
Hemoglobin: 15.1 g/dL (ref 13.0–17.7)
MCH: 33.7 pg — ABNORMAL HIGH (ref 26.6–33.0)
MCHC: 34.6 g/dL (ref 31.5–35.7)
MCV: 98 fL — ABNORMAL HIGH (ref 79–97)
Platelets: 292 10*3/uL (ref 150–450)
RBC: 4.48 x10E6/uL (ref 4.14–5.80)
RDW: 12.1 % (ref 11.6–15.4)
WBC: 6 10*3/uL (ref 3.4–10.8)

## 2022-01-16 NOTE — Patient Instructions (Signed)
Medication Instructions:  Your physician recommends that you continue on your current medications as directed. Please refer to the Current Medication list given to you today.  *If you need a refill on your cardiac medications before your next appointment, please call your pharmacy*  Lab Work: TODAY: CBC, BMET If you have labs (blood work) drawn today and your tests are completely normal, you will receive your results only by: [place] (if you have MyChart) OR A paper copy in the mail If you have any lab test that is abnormal or we need to change your treatment, we will call you to review the results.  Testing/Procedures: Your physician has recommended you have a left heart catheterization performed. Please see instructions below.  Follow-Up: At [facility], you and your health needs are our priority.  As part of our continuing mission to provide you with exceptional heart care, we have created designated Provider Care Teams.  These Care Teams include your primary Cardiologist (physician) and Advanced Practice Providers (APPs -  Physician Assistants and Nurse Practitioners) who all work together to provide you with the care you need, when you need it.  Your next appointment:   3-4 week(s)  The format for your next appointment:   In Person  Provider:   Sinclair Grooms, MD  or Nicholes Rough, PA-C, Melina Copa, PA-C, Ambrose Pancoast, NP, Christen Bame, NP, or [place] Dopp, PA-C   Other Instructions       Cardiac/Peripheral Catheterization   You are scheduled for a Cardiac Catheterization on Wednesday, November 15 with Dr. Daneen Schick.  1. Please arrive at the Main Entrance A at [facility]: [place], [place] [address] on November 15 at 12:00 PM (This time is two hours before your procedure to ensure your preparation). Free valet parking service is available. You will check in at ADMITTING. The support person will be asked to wait in the waiting  room.  It is OK to have someone drop you off and come back when you are ready to be discharged.       Special note: Every effort is made to have your procedure done on time. Please understand that emergencies sometimes delay scheduled procedures.  2. Diet: Do not eat solid foods after midnight.  You may have clear liquids until 5 AM the day of the procedure.  3. Medication instructions in preparation for your procedure:   Contrast Allergy: No  Stop taking, spironolactone, Entresto, and Jardiance on the morning of Wednesday, November 15,  On the morning of your procedure, take Aspirin 81 mg and Plavix/Clopidogrel and any morning medicines NOT listed above.  You may use sips of water.  4. Plan to go home the same day, you will only stay overnight if medically necessary. 5. You MUST have a responsible adult to drive you home. 6. An adult MUST be with you the first 24 hours after you arrive home. 7. Bring a current list of your medications, and the last time and date medication taken. 8. Bring ID and current insurance cards. 9.Please wear clothes that are easy to get on and off and wear slip-on shoes.  Thank you for allowing [place] to care for you!   -- [place] [facility]   Important Information About [place]

## 2022-01-17 ENCOUNTER — Telehealth: Payer: Self-pay | Admitting: *Deleted

## 2022-01-17 NOTE — Telephone Encounter (Signed)
Cardiac Catheterization scheduled at [facility] for: Wednesday January 18, 2022 2 PM Arrival time and place: [place] Entrance A at: 41 Noon  Nothing to eat after midnight prior to procedure, clear liquids until 5 AM day of procedure.  Medication instructions: -Hold:  Spironolactone/KCl-AM of procedure Jardiance-AM of procedure  -Except hold medications usual morning medications can be taken with sips of water including aspirin 81 mg and Plavix 75 mg  Confirmed patient has responsible adult to drive home post procedure and be with patient first 24 hours after arriving home.  Patient reports no new symptoms concerning for COVID-19 in the past 10 days.  Reviewed procedure instructions with patient.

## 2022-01-18 ENCOUNTER — Ambulatory Visit (HOSPITAL_COMMUNITY)
Admission: RE | Admit: 2022-01-18 | Discharge: 2022-01-18 | Disposition: A | Discharge status: Home / Self Care | Payer: Medicare Other | Attending: Interventional Cardiology | Admitting: Interventional Cardiology

## 2022-01-18 ENCOUNTER — Encounter (HOSPITAL_COMMUNITY)
Admission: RE | Disposition: A | Discharge status: Home / Self Care | Payer: Self-pay | Source: Home / Self Care | Attending: Interventional Cardiology

## 2022-01-18 ENCOUNTER — Other Ambulatory Visit: Payer: Self-pay

## 2022-01-18 DIAGNOSIS — E785 Hyperlipidemia, unspecified: Secondary | ICD-10-CM | POA: Diagnosis not present

## 2022-01-18 DIAGNOSIS — I25118 Atherosclerotic heart disease of native coronary artery with other forms of angina pectoris: Secondary | ICD-10-CM | POA: Diagnosis not present

## 2022-01-18 DIAGNOSIS — Z955 Presence of coronary angioplasty implant and graft: Secondary | ICD-10-CM | POA: Diagnosis not present

## 2022-01-18 DIAGNOSIS — Z95810 Presence of automatic (implantable) cardiac defibrillator: Secondary | ICD-10-CM | POA: Diagnosis not present

## 2022-01-18 DIAGNOSIS — R079 Chest pain, unspecified: Secondary | ICD-10-CM | POA: Diagnosis present

## 2022-01-18 DIAGNOSIS — I5022 Chronic systolic (congestive) heart failure: Secondary | ICD-10-CM | POA: Insufficient documentation

## 2022-01-18 DIAGNOSIS — I10 Essential (primary) hypertension: Secondary | ICD-10-CM | POA: Diagnosis present

## 2022-01-18 DIAGNOSIS — I209 Angina pectoris, unspecified: Secondary | ICD-10-CM

## 2022-01-18 DIAGNOSIS — I429 Cardiomyopathy, unspecified: Secondary | ICD-10-CM

## 2022-01-18 DIAGNOSIS — I2510 Atherosclerotic heart disease of native coronary artery without angina pectoris: Secondary | ICD-10-CM | POA: Diagnosis not present

## 2022-01-18 DIAGNOSIS — I2582 Chronic total occlusion of coronary artery: Secondary | ICD-10-CM | POA: Insufficient documentation

## 2022-01-18 DIAGNOSIS — I110 Hypertensive heart disease with heart failure: Secondary | ICD-10-CM | POA: Diagnosis not present

## 2022-01-18 DIAGNOSIS — Z79899 Other long term (current) drug therapy: Secondary | ICD-10-CM | POA: Insufficient documentation

## 2022-01-18 HISTORY — PX: LEFT HEART CATH AND CORONARY ANGIOGRAPHY: CATH118249

## 2022-01-18 SURGERY — LEFT HEART CATH AND CORONARY ANGIOGRAPHY
Anesthesia: LOCAL

## 2022-01-18 MED ORDER — ACETAMINOPHEN 325 MG PO TABS: 650.0000 mg | ORAL_TABLET | ORAL | Status: DC | PRN

## 2022-01-18 MED ORDER — SODIUM CHLORIDE 0.9 % IV SOLN: INTRAVENOUS | Status: DC

## 2022-01-18 MED ORDER — ISOSORBIDE MONONITRATE ER 30 MG PO TB24: 15.0000 mg | ORAL_TABLET | Freq: Every day | ORAL | 3 refills | Status: DC

## 2022-01-18 MED ORDER — VERAPAMIL HCL 2.5 MG/ML IV SOLN
INTRAVENOUS | Status: AC
  Filled 2022-01-18: qty 2

## 2022-01-18 MED ORDER — ASPIRIN 81 MG PO CHEW: 81.0000 mg | CHEWABLE_TABLET | ORAL | Status: DC

## 2022-01-18 MED ORDER — LIDOCAINE HCL (PF) 1 % IJ SOLN
INTRAMUSCULAR | Status: AC
  Filled 2022-01-18: qty 30

## 2022-01-18 MED ORDER — SODIUM CHLORIDE 0.9% FLUSH: 3.0000 mL | Freq: Two times a day (BID) | INTRAVENOUS | Status: DC

## 2022-01-18 MED ORDER — HEPARIN (PORCINE) IN NACL 1000-0.9 UT/500ML-% IV SOLN
INTRAVENOUS | Status: DC | PRN
  Administered 2022-01-18 (×2): 500 mL

## 2022-01-18 MED ORDER — SODIUM CHLORIDE 0.9 % IV SOLN: 250.0000 mL | INTRAVENOUS | Status: DC | PRN

## 2022-01-18 MED ORDER — SODIUM CHLORIDE 0.9% FLUSH: 3.0000 mL | INTRAVENOUS | Status: DC | PRN

## 2022-01-18 MED ORDER — LABETALOL HCL 5 MG/ML IV SOLN: 10.0000 mg | INTRAVENOUS | Status: DC | PRN

## 2022-01-18 MED ORDER — VERAPAMIL HCL 2.5 MG/ML IV SOLN
INTRAVENOUS | Status: DC | PRN
  Administered 2022-01-18: 10 mL via INTRA_ARTERIAL

## 2022-01-18 MED ORDER — MIDAZOLAM HCL 2 MG/2ML IJ SOLN
INTRAMUSCULAR | Status: AC
  Filled 2022-01-18: qty 2

## 2022-01-18 MED ORDER — HEPARIN SODIUM (PORCINE) 1000 UNIT/ML IJ SOLN
INTRAMUSCULAR | Status: AC
  Filled 2022-01-18: qty 10

## 2022-01-18 MED ORDER — FENTANYL CITRATE (PF) 100 MCG/2ML IJ SOLN
INTRAMUSCULAR | Status: DC | PRN
  Administered 2022-01-18 (×2): 25 ug via INTRAVENOUS

## 2022-01-18 MED ORDER — LIDOCAINE HCL (PF) 1 % IJ SOLN
INTRAMUSCULAR | Status: DC | PRN
  Administered 2022-01-18: 2 mL

## 2022-01-18 MED ORDER — MIDAZOLAM HCL 2 MG/2ML IJ SOLN
INTRAMUSCULAR | Status: DC | PRN
  Administered 2022-01-18 (×3): 1 mg via INTRAVENOUS

## 2022-01-18 MED ORDER — CLOPIDOGREL BISULFATE 75 MG PO TABS: 75.0000 mg | ORAL_TABLET | ORAL | Status: DC

## 2022-01-18 MED ORDER — HEPARIN SODIUM (PORCINE) 1000 UNIT/ML IJ SOLN
INTRAMUSCULAR | Status: DC | PRN
  Administered 2022-01-18: 3500 [IU] via INTRAVENOUS

## 2022-01-18 MED ORDER — ONDANSETRON HCL 4 MG/2ML IJ SOLN: 4.0000 mg | Freq: Four times a day (QID) | INTRAMUSCULAR | Status: DC | PRN

## 2022-01-18 MED ORDER — FENTANYL CITRATE (PF) 100 MCG/2ML IJ SOLN
INTRAMUSCULAR | Status: AC
  Filled 2022-01-18: qty 2

## 2022-01-18 MED ORDER — HYDRALAZINE HCL 20 MG/ML IJ SOLN: 10.0000 mg | INTRAMUSCULAR | Status: DC | PRN

## 2022-01-18 MED ORDER — HEPARIN (PORCINE) IN NACL 1000-0.9 UT/500ML-% IV SOLN
INTRAVENOUS | Status: AC
  Filled 2022-01-18: qty 1000

## 2022-01-18 MED ORDER — IOHEXOL 350 MG/ML SOLN
INTRAVENOUS | Status: DC | PRN
  Administered 2022-01-18: 130 mL

## 2022-01-18 SURGICAL SUPPLY — 18 items
CATH 5FR JL3.5 JR4 ANG PIG MP (CATHETERS) IMPLANT
CATH INFINITI 5 FR 3DRC (CATHETERS) IMPLANT
CATH INFINITI 5FR MPB2 (CATHETERS) IMPLANT
CATH LAUNCHER 5F EBU3.0 (CATHETERS) IMPLANT
CATH LAUNCHER 5F JL3 (CATHETERS) IMPLANT
CATH LAUNCHER 5F RADR (CATHETERS) IMPLANT
CATHETER LAUNCHER 5F EBU3.0 (CATHETERS) ×1
CATHETER LAUNCHER 5F JL3 (CATHETERS) ×1
CATHETER LAUNCHER 5F RADR (CATHETERS) ×1
DEVICE RAD COMP TR BAND LRG (VASCULAR PRODUCTS) IMPLANT
GLIDESHEATH SLEND A-KIT 6F 22G (SHEATH) IMPLANT
GUIDEWIRE INQWIRE 1.5J.035X260 (WIRE) IMPLANT
INQWIRE 1.5J .035X260CM (WIRE) ×1
KIT HEART LEFT (KITS) ×1 IMPLANT
PACK CARDIAC CATHETERIZATION (CUSTOM PROCEDURE TRAY) ×1 IMPLANT
SHEATH PROBE COVER 6X72 (BAG) IMPLANT
TRANSDUCER W/STOPCOCK (MISCELLANEOUS) ×1 IMPLANT
TUBING CIL FLEX 10 FLL-RA (TUBING) ×1 IMPLANT

## 2022-01-18 NOTE — CV Procedure (Signed)
Nondominant RCA supplying collaterals to the apical LAD.  Total occlusion in the LAD is at the distal 10 mm of the previously placed overlapping ostial to mid LAD stents in 2016. Total occlusion mid circumflex after the large first obtuse marginal.  The proximal to mid circumflex is a 2 stent procedure involving the proximal obtuse marginal and circumflex.  Total occlusion is within the stented segment distal to the origin of the first obtuse marginal.  The distal circumflex fills by left to to left collaterals from the first obtuse marginal. The left coronary system separate ostia for the circumflex and LAD. Severe left ventricular dysfunction with mid anterior to apical akinesis/dyskinesis.  LVEF 30% with LVEDP 24 mmHg.

## 2022-01-18 NOTE — Progress Notes (Signed)
TR band removed and new dressing placed including an arm brace. No bleeding or hematoma noted to site.

## 2022-01-18 NOTE — Progress Notes (Signed)
Attempted to release 3cc's and pt began bleeding. 3cc's replaced and will wait an extra 58mns and attempt again.

## 2022-01-18 NOTE — Interval H&P Note (Signed)
Cath Lab Visit (complete for each Cath Lab visit)  Clinical Evaluation Leading to the Procedure:   ACS: No.  Non-ACS:    Anginal Classification: CCS III  Anti-ischemic medical therapy: Maximal Therapy (2 or more classes of medications)  Non-Invasive Test Results: No non-invasive testing performed  Prior CABG: No previous CABG      History and Physical Interval Note:  01/18/2022 1:07 PM  Kurt Reilly  has presented today for surgery, with the diagnosis of chest pain.  The various methods of treatment have been discussed with the patient and family. After consideration of risks, benefits and other options for treatment, the patient has consented to  Procedure(s): LEFT HEART CATH AND CORONARY ANGIOGRAPHY (N/A) as a surgical intervention.  The patient's history has been reviewed, patient examined, no change in status, stable for surgery.  I have reviewed the patient's chart and labs.  Questions were answered to the patient's satisfaction.     Belva Crome III

## 2022-01-18 NOTE — Progress Notes (Signed)
Pt and wife received d/c instructions written and verbal. All questions and concerns addressed. PIVs removed and intact. Dressing and arm brace in place, no bleeding or hematoma noted. Pt denies any acute pain or discomfort. Remains in stable condition.

## 2022-01-19 ENCOUNTER — Encounter (HOSPITAL_COMMUNITY): Payer: Self-pay | Admitting: Interventional Cardiology

## 2022-01-20 ENCOUNTER — Telehealth: Payer: Self-pay | Admitting: Interventional Cardiology

## 2022-01-20 NOTE — Telephone Encounter (Signed)
Interdisciplinary Heart Team Summary Note   Attendees: [place], [place], [place], [place], [place], [place], [place], [place], and [place] The team reviewed the clinical history and coronary images from catheterization performed 01/18/2022. Medical therapy was felt the best treatment option. CABG to LIMA would not provide benefit as anteroapical area is infarcted. The distal circumflex is too small to graft. CTO PCI was likewise not felt to reasonable given vessel size and infarct location. Discussed with patient and wife. Plan to add LA nitrates, encourage physical activity/walking to recruit collaterals, and follow clinically.

## 2022-01-20 NOTE — Telephone Encounter (Signed)
Interdisciplinary Heart Team Summary Note  Attendees: [place], [place], [place], [place], [place], [place], [place], [place], and [place] The team reviewed the clinical history and coronary images from catheterization performed 01/18/2022. Medical therapy was felt the best treatment option. CABG to LIMA would not provide benefit as anteroapical area is infarcted. The distal circumflex is too small to graft. CTO PCI was likewise not felt to reasonable given vessel size and infarct location. Discussed with patient and wife. Plan to add LA nitrates, encourage physical activity/walking to recruit collaterals, and follow clinically.

## 2022-01-23 ENCOUNTER — Ambulatory Visit (INDEPENDENT_AMBULATORY_CARE_PROVIDER_SITE_OTHER): Payer: Medicare Other

## 2022-01-23 DIAGNOSIS — I5022 Chronic systolic (congestive) heart failure: Secondary | ICD-10-CM

## 2022-01-23 DIAGNOSIS — I469 Cardiac arrest, cause unspecified: Secondary | ICD-10-CM

## 2022-01-24 LAB — CUP PACEART REMOTE DEVICE CHECK
Battery Remaining Percentage: 84 %
Date Time Interrogation Session: 20231120083100
Implantable Lead Connection Status: 753985
Implantable Lead Implant Date: 20170727
Implantable Lead Location: 753862
Implantable Lead Model: 3401
Implantable Pulse Generator Implant Date: 20220404
Pulse Gen Serial Number: 156707

## 2022-02-03 ENCOUNTER — Other Ambulatory Visit: Payer: Self-pay | Admitting: Interventional Cardiology

## 2022-02-03 ENCOUNTER — Other Ambulatory Visit (HOSPITAL_COMMUNITY): Payer: Self-pay | Admitting: Internal Medicine

## 2022-02-03 MED ORDER — SPIRONOLACTONE 25 MG PO TABS: 12.5000 mg | ORAL_TABLET | ORAL | 3 refills | Status: DC

## 2022-02-06 ENCOUNTER — Encounter: Payer: Medicare Other | Admitting: Internal Medicine

## 2022-02-07 ENCOUNTER — Encounter: Payer: Self-pay | Admitting: Internal Medicine

## 2022-02-07 ENCOUNTER — Ambulatory Visit: Payer: Medicare Other | Attending: Internal Medicine | Admitting: Internal Medicine

## 2022-02-07 ENCOUNTER — Telehealth: Payer: Self-pay | Admitting: Pharmacist

## 2022-02-07 VITALS — BP 118/76 | HR 73 | Ht 67.0 in | Wt 162.0 lb

## 2022-02-07 DIAGNOSIS — Z95810 Presence of automatic (implantable) cardiac defibrillator: Secondary | ICD-10-CM | POA: Insufficient documentation

## 2022-02-07 DIAGNOSIS — E785 Hyperlipidemia, unspecified: Secondary | ICD-10-CM

## 2022-02-07 DIAGNOSIS — I2510 Atherosclerotic heart disease of native coronary artery without angina pectoris: Secondary | ICD-10-CM | POA: Diagnosis present

## 2022-02-07 DIAGNOSIS — I5022 Chronic systolic (congestive) heart failure: Secondary | ICD-10-CM | POA: Diagnosis present

## 2022-02-07 DIAGNOSIS — I255 Ischemic cardiomyopathy: Secondary | ICD-10-CM

## 2022-02-07 DIAGNOSIS — I25118 Atherosclerotic heart disease of native coronary artery with other forms of angina pectoris: Secondary | ICD-10-CM

## 2022-02-07 MED ORDER — PRALUENT 150 MG/ML ~~LOC~~ SOAJ: 1.0000 mL | SUBCUTANEOUS | 3 refills | Status: DC

## 2022-02-07 NOTE — Telephone Encounter (Signed)
PA for Praluent approved through 03/06/23.

## 2022-02-07 NOTE — Addendum Note (Signed)
Addended by: Rollen Sox on: 02/07/2022 09:05 AM   Modules accepted: Orders

## 2022-02-07 NOTE — Telephone Encounter (Signed)
PA for Praluent submitted.  Key: [id number]

## 2022-02-07 NOTE — Progress Notes (Signed)
HPI Mr. Pro returns today for followup. He is a pleasant 71 yo man with an ICM, s/p MI with persistent LV dysfunction and EF 25%. He recently had a left heart cath. He has not received any ICD therapies. He has undergone S-ICD gen change out. He has been treated with XRT for recurrent prostate CA.  He is still exercising.  Allergies  Allergen Reactions   Ezetimibe Other (See Comments)    Muscle aches    Repatha [Evolocumab]     myalgias   Statins Other (See Comments)    Myalgia   Penicillins Rash    Occurred as a child - tolerated Zosyn June 2016      Current Outpatient Medications  Medication Sig Dispense Refill   Alirocumab (PRALUENT) 150 MG/ML SOAJ Inject 1 mL into the skin every 14 (fourteen) days. 6 mL 3   allopurinol (ZYLOPRIM) 300 MG tablet TAKE 1 TABLET BY MOUTH DAILY FOR GOUT 90 tablet 3   aspirin 81 MG chewable tablet Chew 81 mg by mouth daily.     BLACK ELDERBERRY PO Take 550 mg by mouth once a week.     carvedilol (COREG) 6.25 MG tablet TAKE 1 TABLET BY MOUTH TWICE  DAILY WITH MEALS 180 tablet 3   Cholecalciferol (VITAMIN D-3) 25 MCG (1000 UT) CAPS Take 2,000 Units by mouth daily.     clopidogrel (PLAVIX) 75 MG tablet Take 1 tablet (75 mg total) by mouth daily. 90 tablet 2   Colchicine 0.6 MG CAPS Take 0.6 mg by mouth daily as needed (gout).     empagliflozin (JARDIANCE) 10 MG TABS tablet TAKE 1 TABLET BY MOUTH DAILY  BEFORE BREAKFAST 90 tablet 0   ferrous sulfate 325 (65 FE) MG tablet Take 325 mg by mouth daily with breakfast.     fluticasone (FLONASE) 50 MCG/ACT nasal spray Place 1 spray into both nostrils daily as needed for allergies or rhinitis.     isosorbide mononitrate (IMDUR) 30 MG 24 hr tablet Take 0.5 tablets (15 mg total) by mouth daily. 45 tablet 3   MILK THISTLE PO Take 500 mg by mouth daily.     Multiple Vitamins-Minerals (MULTIVITAMIN WITH MINERALS) tablet Take 1 tablet by mouth daily.     nitroGLYCERIN (NITROSTAT) 0.4 MG SL tablet PLACE 1 TABLET  UNDER THE TONGUE EVERY 5 MINUTES AS NEEDED FOR CHEST PAIN 25 tablet 11   potassium chloride SA (KLOR-CON M20) 20 MEQ tablet TAKE 1 TABLET BY MOUTH ONLY WHEN YOU TAKE THE TORSEMIDE (Patient taking differently: Take 20 mEq by mouth daily as needed (water weight gain). TAKE 1 TABLET BY MOUTH ONLY WHEN YOU TAKE THE TORSEMIDE) 90 tablet 1   sacubitril-valsartan (ENTRESTO) 49-51 MG Take 1 tablet by mouth 2 (two) times daily. 180 tablet 3   spironolactone (ALDACTONE) 25 MG tablet Take 0.5 tablets (12.5 mg total) by mouth 3 (three) times a week. Take on Mondays, Wednesdays, and Fridays. 45 tablet 3   torsemide (DEMADEX) 20 MG tablet Take 20 mg by mouth daily as needed (water weight gain).     No current facility-administered medications for this visit.     Past Medical History:  Diagnosis Date   Actinic keratosis    Alkaline phosphatase raised    Anemia    Blood transfusion without reported diagnosis 2016   Cardiac arrhythmia    Cardiomyopathy [facility])    CHF (congestive heart failure) ([place])    Coronary arteriosclerosis    ED (erectile dysfunction)  Gout 03/09/2017   Hemangioma 08/24/2018   Hyperlipidemia    Hypertension    Hypertrophic scar    Malignant neoplasm of prostate ([place]) 2013   Was told that less than 1% of cancer   Melanocytic nevi of trunk    Melanocytic nevus of skin    Senile hyperkeratosis    STEMI (ST elevation myocardial infarction) ([place]) 2016    ROS:   All systems reviewed and negative except as noted in the HPI.   Past Surgical History:  Procedure Laterality Date   ICD IMPLANT     LEFT HEART CATH AND CORONARY ANGIOGRAPHY N/A 01/18/2022   Procedure: LEFT HEART CATH AND CORONARY ANGIOGRAPHY;  Surgeon: Belva Crome, MD;  Location: [place] [facility];  Service: Cardiovascular;  Laterality: N/A;   PROSTATECTOMY  2013   SUBQ ICD CHANGEOUT N/A 06/07/2020   Procedure: [place];  Surgeon: Evans Lance, MD;  Location: [place] [facility];  Service:  Cardiovascular;  Laterality: N/A;     Family History  Problem Relation Age of Onset   Hypertension Father    High Cholesterol Father    Stomach cancer Father    Bladder Cancer Brother    Diabetes Brother    Yves Dill Parkinson White syndrome Son    Migraines Son    Prostate cancer Neg Hx    Breast cancer Neg Hx    Colon cancer Neg Hx    Pancreatic cancer Neg Hx      Social History   Socioeconomic History   Marital status: Married    Spouse name: [name]   Number of children: 3   Years of education: Not on file   Highest education level: Not on file  Occupational History    Comment: retired  Tobacco Use   Smoking status: Never   Smokeless tobacco: Never  Vaping Use   Vaping Use: Never used  Substance and Sexual Activity   Alcohol use: Yes    Alcohol/week: 2.0 standard drinks of alcohol    Types: 2 Glasses of wine per week    Comment: 2 glasses of wine per week   Drug use: Never   Sexual activity: Yes    Birth control/protection: None  Other Topics Concern   Not on file  Social History Narrative   Moved to [place] in Oct 2020 to be closer to grandchildren   Retired IT sales professional, Clinical biochemist, Training and development officer   Married   3 sons   Social Determinants of Health   Financial Resource Strain: [place]  (03/15/2021)   Overall Financial Resource Strain (CARDIA)    Difficulty of Paying Living Expenses: Not hard at all  Food Insecurity: No Food Insecurity (03/15/2021)   Hunger Vital Sign    Worried About Running Out of Food in the Last Year: Never true    [place] in the Last Year: Never true  Transportation Needs: No Transportation Needs (03/15/2021)   PRAPARE - Hydrologist (Medical): No    Lack of Transportation (Non-Medical): No  Physical Activity: Sufficiently Active (03/15/2021)   Exercise Vital Sign    Days of Exercise per Week: 6 days    Minutes of Exercise per Session: 50 min  Stress: No Stress Concern Present (03/15/2021)    [place]    Feeling of Stress : Not at all  Social Connections: Moderately Integrated (03/15/2021)   Social Connection and Isolation Panel [NHANES]  Frequency of Communication with Friends and Family: More than three times a week    Frequency of Social Gatherings with Friends and Family: Twice a week    Attends Religious Services: 1 to 4 times per year    Active Member of [employer] or Organizations: No    Attends Archivist Meetings: Never    Marital Status: Married  Human resources officer Violence: Not At Risk (03/15/2021)   Humiliation, Afraid, Rape, and Kick questionnaire    Fear of Current or Ex-Partner: No    Emotionally Abused: No    Physically Abused: No    Sexually Abused: No     BP 118/76   Pulse 73   Ht '5\' 7"'$  (1.702 m)   Wt 162 lb (73.5 kg)   SpO2 99%   BMI 25.37 kg/m   Physical Exam:  Well appearing NAD HEENT: Unremarkable Neck:  No JVD, no thyromegally Lymphatics:  No adenopathy Back:  No CVA tenderness Lungs:  Clear with no wheezes HEART:  Regular rate rhythm, no murmurs, no rubs, no clicks Abd:  soft, positive bowel sounds, no organomegally, no rebound, no guarding Ext:  2 plus pulses, no edema, no cyanosis, no clubbing Skin:  No rashes no nodules Neuro:  CN II through XII intact, motor grossly intact   DEVICE  Normal device function.  See PaceArt for details.   Assess/Plan:  Chronic systolic heart failure - He is on good medical therapy and his symptoms are class 2. No change in meds. S-ICD - he appears to have healed up nicely. His device is working normally. CAD - he is s/p MI. He has class 2 anginal symptoms. He is encouraged to walk and take slntg as necessary. HTN - his bp is well controlled. No change in his meds.   Carleene Overlie Zakaria Sedor,MD

## 2022-02-07 NOTE — Patient Instructions (Signed)
Medication Instructions:  Your physician recommends that you continue on your current medications as directed. Please refer to the Current Medication list given to you today.  *If you need a refill on your cardiac medications before your next appointment, please call your pharmacy*  Lab Work: None ordered.  If you have labs (blood work) drawn today and your tests are completely normal, you will receive your results only by: [place] (if you have MyChart) OR A paper copy in the mail If you have any lab test that is abnormal or we need to change your treatment, we will call you to review the results.  Testing/Procedures: None ordered.  Follow-Up: At [facility], you and your health needs are our priority.  As part of our continuing mission to provide you with exceptional heart care, we have created designated Provider Care Teams.  These Care Teams include your primary Cardiologist (physician) and Advanced Practice Providers (APPs -  Physician Assistants and Nurse Practitioners) who all work together to provide you with the care you need, when you need it.  We recommend signing up for the patient portal called "MyChart".  Sign up information is provided on this After Visit Summary.  MyChart is used to connect with patients for Virtual Visits (Telemedicine).  Patients are able to view lab/test results, encounter notes, upcoming appointments, etc.  Non-urgent messages can be sent to your provider as well.   To learn more about what you can do with MyChart, go to [email or web address].    Your next appointment:   1 year(s)  The format for your next appointment:   In Person  Provider:   Cristopher Peru, MD{or one of the following Advanced Practice Providers on your designated Care Team:   Tommye Standard, [place] Legrand Como "Jonni Sanger" Chalmers Cater, [place]  Remote monitoring is used to monitor your ICD from home. This monitoring reduces the number of office visits required to check your device to one  time per year. It allows [place] to keep an eye on the functioning of your device to ensure it is working properly. You are scheduled for a device check from home on 04/24/22. You may send your transmission at any time that day. If you have a wireless device, the transmission will be sent automatically. After your physician reviews your transmission, you will receive a postcard with your next transmission date.  Important Information About [place]

## 2022-02-12 NOTE — Progress Notes (Unsigned)
Cardiology Office Note:    Date:  02/13/2022   ID:  Kurt Reilly, DOB Jun 01, 1950, MRN [id number]  PCP:  Inda Coke, [facility] Providers Cardiologist:  Sinclair Grooms, MD     Referring MD: Inda Coke, [place]   Chief Complaint: CAD follow-up  History of Present Illness:    Kurt Reilly is a very pleasant 71 y.o. male with a hx of CAD with anterior MI/cardiac arrest in May 2016 with 100% LAD with PCI -had associated cardiogenic shock/failure to wean -then had staged PCI to LCx and ostial LAD in June 2016 (total of 2 stents to the LAD), [place] and LAD stents 2119, chronic systolic heart failure, ICM, underlying ICD (subcutaneous 2017), HLD, HTN, XRT for recurrent prostate cancer, and ED. EF was less than 35% ? improved to 40 to 45%, most recently 30-35% on echo 02/2021.  He underwent subcutaneous ICD changed out 06/07/2020.  Per Dr. Tamala Julian, he has a total of at least 5 stents deployed in the LAD and 2 in the circumflex.  Whether they overlap or not is unclear.  Most recent stent appointments were in 2017. Sizes range between 3.0-4.0 with the great majority being 3.5 diameter.  Seen by Dr. Tamala Julian 01/16/2022 with chest discomfort when walking up incline that had progressed over the previous 2 months.  He can walk up and down stairs without a problem.  No chest discomfort at rest.  He has never used nitroglycerin. No orthopnea or PND.  EKG revealed sinus bradycardia, poor R wave progression V1 through V3, T wave flattening when compared to tracing from October 2022.  With symptoms of exertional angina, through shared decision making it was best approach was coronary angiography to define anatomy and guide therapy.  LHC 01/18/2022 revealed separate LAD and circumflex ostia noted, ostial to mid LAD stent patent except for the distal 10 mm where there is total occlusion.  Mid to apical LAD is filled by left to left and right to left collaterals.  Ostial to distal circumflex  stents ending in a two-step bifurcation, demonstrating total occlusion of the distal margin of the circumflex in-stent.  Left to right collaterals noted from the patent obtuse marginal arising from the bifurcation stent.  Nondominant right coronary supplying collaterals to the apical LAD, severe left ventricular dysfunction, segmental involving mid anterior wall to apex and inferior apical segment with some regions of akinesis/dyskinesis.  EDP 24 mmHg, EF 25 to 35%. Case discussed with interdisciplinary heart team with the following summary:  Attendees: [place], [place], [place], [place], [place], [place], [place], [place], [place], and [place] The team reviewed the clinical history and coronary images from catheterization performed 01/18/2022. Medical therapy was felt the best treatment option. CABG to LIMA would not provide benefit as anteroapical area is infarcted. The distal circumflex is too small to graft. CTO PCI was likewise not felt to reasonable given vessel size and infarct location. Discussed with patient and wife. Plan to add LA nitrates, encourage physical activity/walking to recruit collaterals, and follow clinically.  Seen by Dr. Lovena Le, EP, on 02/07/22 at which time he was feeling well with no specific cardiac complaints. Advised to continue medical therapy. One year follow-up was recommended.   Today, he is here with his wife for follow-up of CAD. Is feeling well, walks on treadmill 45 minutes daily. Also very active at home, does yard and house work. Has dizziness when he wakes up that gradually resolves throughout the day, this occurs daily. He only  has chest pain if he walks up a 4.5-5% or higher incline. No chest pain, dyspnea, palpitations, presyncope, syncope with regular physical activity. Has stairs in his home which he is up and down all day with no symptoms. Takes a 45 minute nap daily and feels fatigued in the evenings. Is having low BP every evening, recalls recent BP of 106/55.  Also c/o right arm pain at the site of the cath. There is no deformity in this area. Later tells me he built something shortly after his cath, thinks he must have aggravated it. Feels his heart pounding more since recent catheterization. Had to stop to rest after 1 hour of bending over doing yard work. Eats low sodium < 1500 mg diet and hydrates well. No orthopnea, edema, or PND. Did not start the isosorbide.   Past Medical History:  Diagnosis Date   Actinic keratosis    Alkaline phosphatase raised    Anemia    Blood transfusion without reported diagnosis 2016   Cardiac arrhythmia    Cardiomyopathy [facility])    CHF (congestive heart failure) (HCC)    Coronary arteriosclerosis    ED (erectile dysfunction)    Gout 03/09/2017   Hemangioma 08/24/2018   Hyperlipidemia    Hypertension    Hypertrophic scar    Malignant neoplasm of prostate ([place]) 2013   Was told that less than 1% of cancer   Melanocytic nevi of trunk    Melanocytic nevus of skin    Senile hyperkeratosis    STEMI (ST elevation myocardial infarction) ([place]) 2016    Past Surgical History:  Procedure Laterality Date   ICD IMPLANT     LEFT HEART CATH AND CORONARY ANGIOGRAPHY N/A 01/18/2022   Procedure: LEFT HEART CATH AND CORONARY ANGIOGRAPHY;  Surgeon: Belva Crome, MD;  Location: [place] [facility];  Service: Cardiovascular;  Laterality: N/A;   PROSTATECTOMY  2013   SUBQ ICD CHANGEOUT N/A 06/07/2020   Procedure: [place];  Surgeon: Evans Lance, MD;  Location: [place] [facility];  Service: Cardiovascular;  Laterality: N/A;    Current Medications: Current Meds  Medication Sig   Alirocumab (PRALUENT) 150 MG/ML SOAJ Inject 1 mL into the skin every 14 (fourteen) days.   allopurinol (ZYLOPRIM) 300 MG tablet TAKE 1 TABLET BY MOUTH DAILY FOR GOUT   aspirin 81 MG chewable tablet Chew 81 mg by mouth daily.   BLACK ELDERBERRY PO Take 550 mg by mouth once a week.   carvedilol (COREG) 6.25 MG tablet TAKE 1 TABLET BY  MOUTH TWICE  DAILY WITH MEALS   Cholecalciferol (VITAMIN D-3) 25 MCG (1000 UT) CAPS Take 2,000 Units by mouth daily.   clopidogrel (PLAVIX) 75 MG tablet Take 1 tablet (75 mg total) by mouth daily.   Colchicine 0.6 MG CAPS Take 0.6 mg by mouth daily as needed (gout).   empagliflozin (JARDIANCE) 10 MG TABS tablet TAKE 1 TABLET BY MOUTH DAILY  BEFORE BREAKFAST   ferrous sulfate 325 (65 FE) MG tablet Take 325 mg by mouth daily with breakfast.   fluticasone (FLONASE) 50 MCG/ACT nasal spray Place 1 spray into both nostrils daily as needed for allergies or rhinitis.   MILK THISTLE PO Take 500 mg by mouth daily.   Multiple Vitamins-Minerals (MULTIVITAMIN WITH MINERALS) tablet Take 1 tablet by mouth daily.   nitroGLYCERIN (NITROSTAT) 0.4 MG SL tablet PLACE 1 TABLET UNDER THE TONGUE EVERY 5 MINUTES AS NEEDED FOR CHEST PAIN   potassium chloride SA (KLOR-CON M20) 20 MEQ tablet  TAKE 1 TABLET BY MOUTH ONLY WHEN YOU TAKE THE TORSEMIDE (Patient taking differently: Take 20 mEq by mouth daily as needed (water weight gain). TAKE 1 TABLET BY MOUTH ONLY WHEN YOU TAKE THE TORSEMIDE)   sacubitril-valsartan (ENTRESTO) 24-26 MG Take 1 tablet by mouth 2 (two) times daily.   sacubitril-valsartan (ENTRESTO) 49-51 MG Take 1 tablet by mouth 2 (two) times daily.   spironolactone (ALDACTONE) 25 MG tablet Take 0.5 tablets (12.5 mg total) by mouth 3 (three) times a week. Take on Mondays, Wednesdays, and Fridays.   torsemide (DEMADEX) 20 MG tablet Take 20 mg by mouth daily as needed (water weight gain).     Allergies:   Ezetimibe, Repatha [evolocumab], Statins, and Penicillins   Social History   Socioeconomic History   Marital status: Married    Spouse name: [name]   Number of children: 3   Years of education: Not on file   Highest education level: Not on file  Occupational History    Comment: retired  Tobacco Use   Smoking status: Never   Smokeless tobacco: Never  Vaping Use   Vaping Use: Never used  Substance and  Sexual Activity   Alcohol use: Yes    Alcohol/week: 2.0 standard drinks of alcohol    Types: 2 Glasses of wine per week    Comment: 2 glasses of wine per week   Drug use: Never   Sexual activity: Yes    Birth control/protection: None  Other Topics Concern   Not on file  Social History Narrative   Moved to [place] in Oct 2020 to be closer to grandchildren   Retired IT sales professional, Clinical biochemist, Training and development officer   Married   3 sons   Social Determinants of Health   Financial Resource Strain: [place]  (03/15/2021)   Overall Financial Resource Strain (CARDIA)    Difficulty of Paying Living Expenses: Not hard at all  Food Insecurity: No Food Insecurity (03/15/2021)   Hunger Vital Sign    Worried About Running Out of Food in the Last Year: Never true    [place] in the Last Year: Never true  Transportation Needs: No Transportation Needs (03/15/2021)   PRAPARE - Hydrologist (Medical): No    Lack of Transportation (Non-Medical): No  Physical Activity: Sufficiently Active (03/15/2021)   Exercise Vital Sign    Days of Exercise per Week: 6 days    Minutes of Exercise per Session: 50 min  Stress: No Stress Concern Present (03/15/2021)   [place]    Feeling of Stress : Not at all  Social Connections: Moderately Integrated (03/15/2021)   Social Connection and Isolation Panel [NHANES]    Frequency of Communication with Friends and Family: More than three times a week    Frequency of Social Gatherings with Friends and Family: Twice a week    Attends Religious Services: 1 to 4 times per year    Active Member of [employer] or Organizations: No    Attends Music therapist: Never    Marital Status: Married     Family History: The patient's family history includes Bladder Cancer in his brother; Diabetes in his brother; High Cholesterol in his father; Hypertension in his father; Migraines  in his son; Stomach cancer in his father; Yves Dill Parkinson White syndrome in his son. There is no history of Prostate cancer, Breast cancer, Colon cancer, or Pancreatic cancer.  ROS:   Please see the  history of present illness.    All other systems reviewed and are negative.  Labs/Other Studies Reviewed:    The following studies were reviewed today:  LHC 01/18/22 CONCLUSIONS: Separate LAD and circumflex ostia are noted. Ostial to mid LAD stent is patent except for the distal 10 mm where there is total occlusion.  Mid to apical LAD is filled by left to left and right to left collaterals. Ostial to distal circumflex stents ending in a two-stent bifurcation, demonstrating total occlusion of the distal margin of the circumflex in-stent.  Left to right collaterals noted from the patent obtuse marginal arising from the bifurcation stent. Nondominant right coronary supplying collaterals to the apical LAD. Severe left ventricular dysfunction, segmental involving mid anterior wall to apex and inferoapical segment with some regions of akinesis/dyskinesis.  EDP is 24 mmHg.  EF is 25 to 35%.   RECOMMENDATIONS:   Medical management for now. We will present at heart team conference.  LV function likely excludes him from LAD and distal circumflex territory revascularization.  Total occlusion due to in-stent could be attempted but not likely to be durable.   Continue antianginal and systolic heart failure therapy.  Add long-acting nitrate therapy. Present at heart team, 01/20/2022.   Diagnostic Dominance: Right    Echo 03/02/21  1. 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 The LVEF slightly improved, was 25-30% now 30-35%. Left  ventricular ejection fraction, by estimation, is 30 to 35%. The left  ventricle has moderately decreased function.  The left ventricle has no regional wall motion abnormalities. Left  ventricular diastolic parameters are consistent  with Grade I diastolic  dysfunction (impaired relaxation).   2. Right ventricular systolic function is normal. The right ventricular  size is normal. Tricuspid regurgitation signal is inadequate for assessing  PA pressure.   3. Left atrial size was mildly dilated.   4. The mitral valve is normal in structure. Mild mitral valve  regurgitation. No evidence of mitral stenosis.   5. The aortic valve is tricuspid. Aortic valve regurgitation is not  visualized. Aortic valve sclerosis is present, with no evidence of aortic  valve stenosis.   6. The inferior vena cava is normal in size with greater than 50%  respiratory variability, suggesting right atrial pressure of 3 mmHg.    Recent Labs: 05/23/2021: ALT 20 09/09/2021: NT-Pro BNP 459 01/16/2022: BUN 21; Creatinine, Ser 1.17; Hemoglobin 15.1; Platelets 292; Potassium 4.4; Sodium 139  Recent Lipid Panel    Component Value Date/Time   CHOL 124 05/23/2021 0901   CHOL 120 09/22/2020 1213   TRIG 272.0 (H) 05/23/2021 0901   HDL 39.90 05/23/2021 0901   HDL 40 09/22/2020 1213   CHOLHDL 3 05/23/2021 0901   VLDL 54.4 (H) 05/23/2021 0901   LDLCALC 48 09/22/2020 1213   LDLDIRECT 52.0 05/23/2021 0901     Risk Assessment/Calculations:         Physical Exam:    VS:  BP 126/74   Pulse 61   Ht _0  (1.702 m)   Wt 160 lb (72.6 kg)   SpO2 99%   BMI 25.06 kg/m     Wt Readings from Last 3 Encounters:  02/13/22 160 lb (72.6 kg)  02/07/22 162 lb (73.5 kg)  01/18/22 156 lb (70.8 kg)     GEN:  Well nourished, well developed in no acute distress HEENT: Normal NECK: No JVD; No carotid bruits CARDIAC: RRR, no murmurs, rubs, gallops RESPIRATORY:  Clear  to auscultation without rales, wheezing or rhonchi  ABDOMEN: Soft, non-tender, non-distended MUSCULOSKELETAL:  No edema; No deformity. 2+ pedal pulses, equal bilaterally SKIN: Warm and dry NEUROLOGIC:  Alert and oriented x 3 PSYCHIATRIC:  Normal affect   EKG:  EKG is not ordered today.       Diagnoses:    1. Coronary artery disease of native artery of native heart with stable angina pectoris ([place])   2. Hyperlipidemia LDL goal <70   3. Chronic systolic heart failure ([place])   4. Essential hypertension   5. ICD (implantable cardioverter-defibrillator) in place   6. Ischemic cardiomyopathy    Assessment and Plan:     CAD with stable angina: Cath result and management plan reviewed and questions answered to his satisfaction. Is feeling well, very active with symptoms of angina only with incline > 4.5%. Did not start Imdur because he was not having chest pain. Discussed taking it as needed for exercise/hiking. He will consider this. No indication for further ischemic evaluation at this time. No bleeding problems. Continue DAPT with aspirin and Plavix, continue Jardiance, carvedilol, Praluent.   Chronic HFrEF/ICM: s/p ICD implant. LVEF 25-35% on cath 01/18/22. No evidence of volume overload on exam today. He denies dyspnea, orthopnea, PND. Reports dizziness in the mornings and soft BP readings, particularly in the evenings. We will trial lower dose of Entresto at 24-26 mg BID to see if this improves his symptom of fatigue. If no improvement after one month, will resume 49-51 mg dose. Continue GDMT including Jardiance, spironolactone 12.5 mg 3 times per week, carvedilol, Entresto. Has torsemide to take as needed, has never taken it.   ICD implant: Subq ICD with [facility] 06/2020. Seen by Dr. Lovena Le 02/07/22, no ICD therapies. Management per EP.   Hypertension: BP is well-controlled, somewhat soft in the evenings. Will monitor on lower dose of Entresto. Advised him to notify us with concerns prior to next office visit.   Hyperlipidemia LDL goal < 70: LDL 52 on 05/23/21. Continue Praluent. Will have him come in for fasting labs prior to 3 month appointment with Dr. Ali Lowe.      Disposition: 3 months with Dr. Ali Lowe, transfer from Dr. Tamala Julian  Medication Adjustments/Labs and Tests  Ordered: Current medicines are reviewed at length with the patient today.  Concerns regarding medicines are outlined above.  Orders Placed This Encounter  Procedures   Comp Met (CMET)   Lipid Profile   Meds ordered this encounter  Medications   sacubitril-valsartan (ENTRESTO) 24-26 MG    Sig: Take 1 tablet by mouth 2 (two) times daily.    Dispense:  180 tablet    Refill:  3    Please Honor Card patient is presenting for Carmie Kanner: [id number]; Juanna Cao: [id number]; [facility]: [id number]; TDSK: [phone]    Patient Instructions  Medication Instructions:   Terald Sleeper one (1) tablet by mouth ( 24-26 mg) twice daily.     *If you need a refill on your cardiac medications before your next appointment, please call your pharmacy*   Lab Work:  Your physician recommends that you return for a FASTING lipid profile on Wednesday, February 14. You can come in on the day of your appointment anytime between 7:30-4:30 fasting from midnight the night before.    If you have labs (blood work) drawn today and your tests are completely normal, you will receive your results only by: [place] (if you have MyChart) OR A paper copy in the mail If you have any lab test that  is abnormal or we need to change your treatment, we will call you to review the results.   Testing/Procedures:  None ordered.   Follow-Up: At [facility], you and your health needs are our priority.  As part of our continuing mission to provide you with exceptional heart care, we have created designated Provider Care Teams.  These Care Teams include your primary Cardiologist (physician) and Advanced Practice Providers (APPs -  Physician Assistants and Nurse Practitioners) who all work together to provide you with the care you need, when you need it.  We recommend signing up for the patient portal called "MyChart".  Sign up information is provided on this After Visit Summary.  MyChart is used to connect with patients for  Virtual Visits (Telemedicine).  Patients are able to view lab/test results, encounter notes, upcoming appointments, etc.  Non-urgent messages can be sent to your provider as well.   To learn more about what you can do with MyChart, go to [email or web address].    Your next appointment:   2 month(s)  The format for your next appointment:   In Person  Provider:   Dr. Ali Lowe.     Important Information About Sugar         Signed, Emmaline Life, NP  02/13/2022 6:11 PM    [place]

## 2022-02-13 ENCOUNTER — Ambulatory Visit: Payer: Medicare Other | Attending: Nurse Practitioner | Admitting: Nurse Practitioner

## 2022-02-13 ENCOUNTER — Encounter: Payer: Self-pay | Admitting: Nurse Practitioner

## 2022-02-13 VITALS — BP 126/74 | HR 61 | Ht 67.0 in | Wt 160.0 lb

## 2022-02-13 DIAGNOSIS — Z95810 Presence of automatic (implantable) cardiac defibrillator: Secondary | ICD-10-CM | POA: Diagnosis present

## 2022-02-13 DIAGNOSIS — I5022 Chronic systolic (congestive) heart failure: Secondary | ICD-10-CM

## 2022-02-13 DIAGNOSIS — I255 Ischemic cardiomyopathy: Secondary | ICD-10-CM

## 2022-02-13 DIAGNOSIS — I10 Essential (primary) hypertension: Secondary | ICD-10-CM | POA: Diagnosis not present

## 2022-02-13 DIAGNOSIS — E785 Hyperlipidemia, unspecified: Secondary | ICD-10-CM

## 2022-02-13 DIAGNOSIS — I25118 Atherosclerotic heart disease of native coronary artery with other forms of angina pectoris: Secondary | ICD-10-CM | POA: Diagnosis not present

## 2022-02-13 MED ORDER — ENTRESTO 24-26 MG PO TABS: 1.0000 | ORAL_TABLET | Freq: Two times a day (BID) | ORAL | 3 refills | Status: DC

## 2022-02-13 NOTE — Patient Instructions (Signed)
Medication Instructions:   START Entresto one (1) tablet by mouth ( 24-26 mg) twice daily.     *If you need a refill on your cardiac medications before your next appointment, please call your pharmacy*   Lab Work:  Your physician recommends that you return for a FASTING lipid profile on Wednesday, February 14. You can come in on the day of your appointment anytime between 7:30-4:30 fasting from midnight the night before.    If you have labs (blood work) drawn today and your tests are completely normal, you will receive your results only by: [place] (if you have MyChart) OR A paper copy in the mail If you have any lab test that is abnormal or we need to change your treatment, we will call you to review the results.   Testing/Procedures:  None ordered.   Follow-Up: At [facility], you and your health needs are our priority.  As part of our continuing mission to provide you with exceptional heart care, we have created designated Provider Care Teams.  These Care Teams include your primary Cardiologist (physician) and Advanced Practice Providers (APPs -  Physician Assistants and Nurse Practitioners) who all work together to provide you with the care you need, when you need it.  We recommend signing up for the patient portal called "MyChart".  Sign up information is provided on this After Visit Summary.  MyChart is used to connect with patients for Virtual Visits (Telemedicine).  Patients are able to view lab/test results, encounter notes, upcoming appointments, etc.  Non-urgent messages can be sent to your provider as well.   To learn more about what you can do with MyChart, go to [email or web address].    Your next appointment:   2 month(s)  The format for your next appointment:   In Person  Provider:   Dr. Ali Lowe.     Important Information About Sugar

## 2022-02-16 ENCOUNTER — Encounter: Payer: Self-pay | Admitting: *Deleted

## 2022-02-20 ENCOUNTER — Encounter: Payer: Self-pay | Admitting: Interventional Cardiology

## 2022-02-21 ENCOUNTER — Ambulatory Visit: Payer: Medicare Other
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
## 2022-03-01 ENCOUNTER — Ambulatory Visit (INDEPENDENT_AMBULATORY_CARE_PROVIDER_SITE_OTHER): Payer: Medicare Other

## 2022-03-01 DIAGNOSIS — Z23 Encounter for immunization: Secondary | ICD-10-CM | POA: Diagnosis not present

## 2022-03-01 NOTE — Progress Notes (Signed)
Patient was given the flu shot in his left arm today. Patient tolerated injection well. Joanette Gula, CMA

## 2022-03-08 NOTE — Progress Notes (Signed)
Remote ICD transmission.   

## 2022-03-23 ENCOUNTER — Encounter: Payer: Medicare Other | Admitting: Physician Assistant

## 2022-04-12 ENCOUNTER — Other Ambulatory Visit: Payer: Self-pay

## 2022-04-12 MED ORDER — CLOPIDOGREL BISULFATE 75 MG PO TABS: 75.0000 mg | ORAL_TABLET | Freq: Every day | ORAL | 3 refills | Status: DC

## 2022-04-16 ENCOUNTER — Other Ambulatory Visit (HOSPITAL_COMMUNITY): Payer: Self-pay | Admitting: Internal Medicine

## 2022-04-19 ENCOUNTER — Ambulatory Visit: Payer: Medicare Other | Attending: Nurse Practitioner

## 2022-04-19 LAB — LIPID PANEL
Chol/HDL Ratio: 3.2 ratio (ref 0.0–5.0)
Cholesterol, Total: 136 mg/dL (ref 100–199)
HDL: 42 mg/dL (ref >39)
LDL Chol Calc (NIH): 58 mg/dL (ref 0–99)
Triglycerides: 224 mg/dL — ABNORMAL HIGH (ref 0–149)
VLDL Cholesterol Cal: 36 mg/dL (ref 5–40)

## 2022-04-19 LAB — COMPREHENSIVE METABOLIC PANEL
ALT: 22 IU/L (ref 0–44)
AST: 18 IU/L (ref 0–40)
Albumin/Globulin Ratio: 2 (ref 1.2–2.2)
Albumin: 4.8 g/dL (ref 3.9–4.9)
Alkaline Phosphatase: 130 IU/L — ABNORMAL HIGH (ref 44–121)
BUN/Creatinine Ratio: 15 (ref 10–24)
BUN: 16 mg/dL (ref 8–27)
Bilirubin Total: 0.4 mg/dL (ref 0.0–1.2)
CO2: 21 mmol/L (ref 20–29)
Calcium: 9.2 mg/dL (ref 8.6–10.2)
Chloride: 103 mmol/L (ref 96–106)
Creatinine, Ser: 1.06 mg/dL (ref 0.76–1.27)
Globulin, Total: 2.4 g/dL (ref 1.5–4.5)
Glucose: 123 mg/dL — ABNORMAL HIGH (ref 70–99)
Potassium: 4.2 mmol/L (ref 3.5–5.2)
Sodium: 140 mmol/L (ref 134–144)
Total Protein: 7.2 g/dL (ref 6.0–8.5)
eGFR: 76 mL/min/{1.73_m2} (ref >59)

## 2022-04-20 NOTE — Progress Notes (Signed)
Cardiology Office Note:    Date:  05/17/2022   ID:  Kurt Reilly, DOB [birth year], MRN [id number]  PCP:  Inda Coke, [place] Providers Cardiologist:  Lenna Sciara, MD Referring MD: Inda Coke, [place]   Chief Complaint/Reason for Referral:  Cardiology follow up  PATIENT RESCHEDULED   ASSESSMENT:    1. Coronary artery disease of native artery of native heart with stable angina pectoris ([place])   2. Ischemic cardiomyopathy   3. Hyperlipidemia LDL goal <70   4. Essential hypertension   5. ICD (implantable cardioverter-defibrillator) in place   6. Aortic atherosclerosis ([place])   7. CKD (chronic kidney disease) stage 2, GFR 60-89 ml/min     PLAN:    In order of problems listed above: 1.  Coronary artery disease: 2.  Ischemic cardiomyopathy: 3.  Hyperlipidemia: 4.  Hypertension: 5.  Status post ICD: 6.  Aortic atherosclerosis continue aspirin, Praluent, and strict blood pressure control. 7.  CKD stage II: Continue Entresto and Jardiance            History of Present Illness:    FOCUSED PROBLEM LIST:   1.  Coronary artery disease status post PCI of the LAD and bifurcation OM and circumflex remotely; AV Lcx and mid LAD now occluded; anatomy reviewed by Heart Team 2023 and consensus opinion was medical therapy should be pursued 2.  Ischemic cardiomyopathy EF ~35% status post subcutaneous ICD 3.  Hypertension 4.  Hyperlipidemia 5.  CKD stage II 6.  LV apical aneurysm 7.  Aortic atherosclerosis on CT abd/pelvis 2022   The patient is a 72 y.o. male with the indicated medical history here for cardiology follow up.  He was seen in December in the cardiology clinic.  At that point in time he was feeling well walking on a treadmill daily for about 45 minutes.  He endorses chest pain when he walks up an incline.         Current Medications: No outpatient medications have been marked as taking for the 04/26/22 encounter (Office Visit) with Early Osmond,  MD.     Allergies:    Ezetimibe, Repatha [evolocumab], Statins, and Penicillins   Social History:   Social History   Tobacco Use   Smoking status: Never   Smokeless tobacco: Never  Vaping Use   Vaping Use: Never used  Substance Use Topics   Alcohol use: Yes    Alcohol/week: 2.0 standard drinks of alcohol    Types: 2 Glasses of wine per week    Comment: 2 glasses of wine per week   Drug use: Never     Family Hx: Family History  Problem Relation Age of Onset   Hypertension Father    High Cholesterol Father    Stomach cancer Father    Bladder Cancer Brother    Diabetes Brother    Yves Dill Parkinson White syndrome Son    Migraines Son    Prostate cancer Neg Hx    Breast cancer Neg Hx    Colon cancer Neg Hx    Pancreatic cancer Neg Hx      Review of Systems:   Please see the history of present illness.    All other systems reviewed and are negative.     EKGs/Labs/Other Test Reviewed:    EKG:  EKG performed 2023 that I personally reviewed demonstrates sinus bradycardia with NSTT  Prior CV studies:  Coronary angiography 2023: Separate LAD and circumflex ostia are noted. Ostial to mid LAD stent is patent  except for the distal 10 mm where there is total occlusion.  Mid to apical LAD is filled by left to left and right to left collaterals. Ostial to distal circumflex stents ending in a two-stent bifurcation, demonstrating total occlusion of the distal margin of the circumflex in-stent.  Left to right collaterals noted from the patent obtuse marginal arising from the bifurcation stent. Nondominant right coronary supplying collaterals to the apical LAD. Severe left ventricular dysfunction, segmental involving mid anterior wall to apex and inferoapical segment with some regions of akinesis/dyskinesis.  EDP is 24 mmHg.  EF is 25 to 35%.  TTE 2022:  1. 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 The LVEF slightly improved, was 25-30% now  30-35%. Left  ventricular ejection fraction, by estimation, is 30 to 35%. The left  ventricle has moderately decreased function.  The left ventricle has no regional wall motion abnormalities. Left  ventricular diastolic parameters are consistent with Grade I diastolic  dysfunction (impaired relaxation).   2. Right ventricular systolic function is normal. The right ventricular  size is normal. Tricuspid regurgitation signal is inadequate for assessing  PA pressure.   3. Left atrial size was mildly dilated.   4. The mitral valve is normal in structure. Mild mitral valve  regurgitation. No evidence of mitral stenosis.   5. The aortic valve is tricuspid. Aortic valve regurgitation is not  visualized. Aortic valve sclerosis is present, with no evidence of aortic  valve stenosis.   6. The inferior vena cava is normal in size with greater than 50%  respiratory variability, suggesting right atrial pressure of 3 mmHg.   Other studies Reviewed: Review of the additional studies/records demonstrates: CT abd/pelvis 2022 with aortic atherosclerosis  Recent Labs: 09/09/2021: NT-Pro BNP 459 01/16/2022: Hemoglobin 15.1; Platelets 292 04/19/2022: ALT 22; BUN 16; Creatinine, Ser 1.06; Potassium 4.2; Sodium 140   Recent Lipid Panel Lab Results  Component Value Date/Time   CHOL 136 04/19/2022 07:48 AM   TRIG 224 (H) 04/19/2022 07:48 AM   HDL 42 04/19/2022 07:48 AM   LDLCALC 58 04/19/2022 07:48 AM   LDLDIRECT 52.0 05/23/2021 09:01 AM    Risk Assessment/Calculations:      Physical Exam:      Signed, Early Osmond, MD  05/17/2022 2:19 PM    [facility] [place], [place], [place]  [address] Phone: [phone]; Fax: [phone]   Note:  This document was prepared using Dragon voice recognition software and may include unintentional dictation errors.

## 2022-04-25 ENCOUNTER — Other Ambulatory Visit: Payer: Self-pay

## 2022-04-25 MED ORDER — CLOPIDOGREL BISULFATE 75 MG PO TABS: 75.0000 mg | ORAL_TABLET | Freq: Every day | ORAL | 3 refills | Status: DC

## 2022-04-25 MED ORDER — ENTRESTO 24-26 MG PO TABS: 1.0000 | ORAL_TABLET | Freq: Two times a day (BID) | ORAL | 3 refills | Status: DC

## 2022-04-25 NOTE — Telephone Encounter (Signed)
Can you please D/C the other strength if pt is not supposed to be taking it. Please address

## 2022-04-25 NOTE — Telephone Encounter (Signed)
[facility] mail order pharmacy is requesting a refill on Entresto 24-26 mg tablets. Pt has entresto 49-51 mg tablets on medication list as well. Please specify which dose pt is supposed to be taking.

## 2022-04-26 ENCOUNTER — Other Ambulatory Visit: Payer: Self-pay | Admitting: *Deleted

## 2022-04-26 ENCOUNTER — Ambulatory Visit: Payer: Medicare Other | Admitting: Internal Medicine

## 2022-04-26 DIAGNOSIS — E785 Hyperlipidemia, unspecified: Secondary | ICD-10-CM

## 2022-04-26 DIAGNOSIS — G8929 Other chronic pain: Secondary | ICD-10-CM

## 2022-04-26 DIAGNOSIS — I10 Essential (primary) hypertension: Secondary | ICD-10-CM

## 2022-04-26 DIAGNOSIS — Z95810 Presence of automatic (implantable) cardiac defibrillator: Secondary | ICD-10-CM

## 2022-04-26 DIAGNOSIS — I255 Ischemic cardiomyopathy: Secondary | ICD-10-CM

## 2022-04-26 DIAGNOSIS — N182 Chronic kidney disease, stage 2 (mild): Secondary | ICD-10-CM

## 2022-04-26 DIAGNOSIS — I25118 Atherosclerotic heart disease of native coronary artery with other forms of angina pectoris: Secondary | ICD-10-CM

## 2022-04-26 DIAGNOSIS — I700 Atherosclerosis of aorta: Secondary | ICD-10-CM

## 2022-04-26 MED ORDER — ALLOPURINOL 300 MG PO TABS: ORAL_TABLET | ORAL | 0 refills | Status: DC

## 2022-05-16 ENCOUNTER — Other Ambulatory Visit: Payer: Self-pay | Admitting: Pharmacist Clinician (PhC)/ Clinical Pharmacy Specialist

## 2022-05-16 DIAGNOSIS — I25118 Atherosclerotic heart disease of native coronary artery with other forms of angina pectoris: Secondary | ICD-10-CM

## 2022-05-16 DIAGNOSIS — E785 Hyperlipidemia, unspecified: Secondary | ICD-10-CM

## 2022-05-16 MED ORDER — PRALUENT 150 MG/ML ~~LOC~~ SOAJ: 1.0000 mL | SUBCUTANEOUS | 3 refills | Status: DC

## 2022-05-17 NOTE — Progress Notes (Signed)
Subjective:    Kurt Reilly is a 72 y.o. male and is here for review of complex chronic conditions.  HPI  Health Maintenance Due  Topic Date Due   Medicare Annual Wellness (AWV)  Never done    Acute Concerns: Popping in Ears Had flights in 2/24 and popping has not stopped since then. Also had a Covid-19 infection after this trip. Used Flonase but did not help ears. Denies fever.  Chronic Issues: CAD Treated with Praluent 1 mL every 14 days, Plavix 75 mg daily TRIG elevated at 224 on 04/19/22, lipid panel otherwise normal.  Hypertension Treated with Coreg 6.25 mg twice daily with meals. Blood pressure normal today at 112/74.  Vitamin D Deficiency Treated with Vitamin D3 2,000 units daily.  Congestive Heart Failure Treated with Entresto 24-26 mg twice daily, Jardiance 10 mg daily before breakfast, Klor-Con 20 meq daily as needed, Aldactone 25 mg three times weekly on Mondays, Wednesdays, Fridays, Demadex 20 mg as needed.  Biochemically recurrent malignant neoplasm of prostate Managed by urology  Health Maintenance: Immunizations -- Due for shingles, Covid-19 vaccines. Colonoscopy -- Last completed 2018. Recommended repeat in 2028. PSA --  Lab Results  Component Value Date   PSA 0.09 (L) 05/23/2021   PSA 0.46 02/06/2020   Diet -- Eats healthy diet. Sleep habits -- Generally good. Rare insomnia once every few weeks. Exercise -- Stays active.  Weight -- @FLOWAMB (14)@  Recent weight history Wt Readings from Last 10 Encounters:  05/24/22 159 lb 6.4 oz (72.3 kg)  02/13/22 160 lb (72.6 kg)  02/07/22 162 lb (73.5 kg)  01/18/22 156 lb (70.8 kg)  01/16/22 163 lb 3.2 oz (74 kg)  09/09/21 161 lb 3.2 oz (73.1 kg)  05/23/21 162 lb 3.2 oz (73.6 kg)  03/11/21 161 lb (73 kg)  01/03/21 163 lb 9.6 oz (74.2 kg)  12/07/20 161 lb (73 kg)   Body mass index is 26.08 kg/m.  Mood -- Stable. He meditates. Alcohol use --  reports current alcohol use of about 2.0 standard drinks  of alcohol per week.  Tobacco use --  Tobacco Use: Low Risk  (05/24/2022)   Patient History    Smoking Tobacco Use: Never    Smokeless Tobacco Use: Never    Passive Exposure: Not on file    Eligible for Low Dose CT? No  UTD with eye doctor? UTD UTD with dentist? UTD     05/24/2022   10:59 AM  Depression screen PHQ 2/9  Decreased Interest 0  Down, Depressed, Hopeless 0  PHQ - 2 Score 0  Altered sleeping 0  Tired, decreased energy 0  Change in appetite 0  Feeling bad or failure about yourself  0  Trouble concentrating 0  Moving slowly or fidgety/restless 0  Suicidal thoughts 0  PHQ-9 Score 0  Difficult doing work/chores Not difficult at all    Other providers/specialists: Patient Care Team: Inda Coke, [place] as PCP - General (Physician Assistant) Belva Crome, MD (Inactive) as PCP - Cardiology (Cardiology) Lyndee Hensen, PT as Physical Therapist (Physical Therapy) Irine Seal, MD as Attending Physician (Urology) Tyler Pita, MD as Consulting Physician (Radiation Oncology) Delice Bison, Charlestine Massed, NP as Nurse Practitioner (Hematology and Oncology) Harmon Pier, RN as Registered Nurse Edythe Clarity, [facility] as Pharmacist (Pharmacist)    PMHx, SurgHx, SocialHx, Medications, and Allergies were reviewed in the Visit Navigator and updated as appropriate.   Past Medical History:  Diagnosis Date   Actinic keratosis    Alkaline phosphatase raised  Anemia    Blood transfusion without reported diagnosis 2016   Cardiac arrhythmia    Cardiomyopathy [facility])    CHF (congestive heart failure) (H[facility])    Coronary arteriosclerosis    ED (erectile dysfunction)    Gout 03/09/2017   Hemangioma 08/24/2018   Hyperlipidemia    Hypertension    Hypertrophic scar    Malignant neoplasm of prostate ([place]) 2013   Was told that less than 1% of cancer   Melanocytic nevi of trunk    Melanocytic nevus of skin    Senile hyperkeratosis    STEMI (ST elevation myocardial  infarction) ([place]) 2016     Past Surgical History:  Procedure Laterality Date   ICD IMPLANT     LEFT HEART CATH AND CORONARY ANGIOGRAPHY N/A 01/18/2022   Procedure: LEFT HEART CATH AND CORONARY ANGIOGRAPHY;  Surgeon: Belva Crome, MD;  Location: [place];  Service: Cardiovascular;  Laterality: N/A;   PROSTATECTOMY  2013   SUBQ ICD CHANGEOUT N/A 06/07/2020   Procedure: [place];  Surgeon: Evans Lance, MD;  Location: [place] [facility];  Service: Cardiovascular;  Laterality: N/A;     Family History  Problem Relation Age of Onset   Hypertension Father    High Cholesterol Father    Stomach cancer Father    Bladder Cancer Brother    Diabetes Brother    Yves Dill Parkinson White syndrome Son    Migraines Son    Prostate cancer Neg Hx    Breast cancer Neg Hx    Colon cancer Neg Hx    Pancreatic cancer Neg Hx     Social History   Tobacco Use   Smoking status: Never   Smokeless tobacco: Never  Vaping Use   Vaping Use: Never used  Substance Use Topics   Alcohol use: Yes    Alcohol/week: 2.0 standard drinks of alcohol    Types: 2 Glasses of wine per week    Comment: 2 glasses of wine per week   Drug use: Never    Review of Systems:   Review of Systems  Constitutional:  Negative for chills, fever, malaise/fatigue and weight loss.  HENT:  Negative for hearing loss, sinus pain and sore throat.   Respiratory:  Negative for cough, hemoptysis and shortness of breath.   Cardiovascular:  Negative for chest pain, palpitations, leg swelling and PND.  Gastrointestinal:  Negative for abdominal pain, blood in stool, constipation, diarrhea, heartburn, nausea and vomiting.  Genitourinary:  Negative for dysuria, frequency and urgency.  Musculoskeletal:  Negative for back pain, myalgias and neck pain.  Skin:  Negative for itching and rash.  Neurological:  Negative for dizziness, tingling, seizures and headaches.  Endo/Heme/Allergies:  Negative for polydipsia.   Psychiatric/Behavioral:  Negative for depression. The patient has insomnia (Rare). The patient is not nervous/anxious.     Objective:    Vitals:   05/24/22 1100  BP: 112/74  Pulse: (!) 56  Resp: 16  Temp: (!) 97.5 F (36.4 C)  SpO2: 99%    Body mass index is 26.08 kg/m.  General  Alert, cooperative, no distress, appears stated age  Head:  Normocephalic, without obvious abnormality, atraumatic  Eyes:  PERRL, conjunctiva/corneas clear, EOM's intact, fundi benign, both eyes       Ears:  Normal TM's and external ear canals, both ears  Nose: Nares normal, septum midline, mucosa normal, no drainage or sinus tenderness  Throat: Lips, mucosa, and tongue normal; teeth and gums normal  Neck: Supple,  symmetrical, trachea midline, no adenopathy;     thyroid:  No enlargement/tenderness/nodules; no carotid bruit or JVD  Back:   Symmetric, no curvature, ROM normal, no CVA tenderness  Lungs:   Clear to auscultation bilaterally, respirations unlabored  Chest wall:  No tenderness or deformity  Heart:  Regular rate and rhythm, S1 and S2 normal, no murmur, rub or gallop  Abdomen:   Soft, non-tender, bowel sounds active all four quadrants, no masses, no organomegaly  Extremities: Extremities normal, atraumatic, no cyanosis or edema  Prostate : Deferred   Skin: Skin color, texture, turgor normal, no rashes or lesions  Lymph nodes: Cervical, supraclavicular, and axillary nodes normal  Neurologic: CNII-XII grossly intact. Normal strength, sensation and reflexes throughout   AssessmentPlan:   Encounter for general adult medical examination with abnormal findings Today patient counseled on age appropriate routine health concerns for screening and prevention, each reviewed and up to date or declined. Immunizations reviewed and up to date or declined. Labs reviewed. Risk factors for depression reviewed and negative. Hearing function and visual acuity are intact. ADLs screened and addressed as needed.  Functional ability and level of safety reviewed and appropriate. Education, counseling and referrals performed based on assessed risks today. Patient provided with a copy of personalized plan for preventive services.  Dysfunction of both eustachian tubes Suspect ETD Recommend atrovent nasal spray  Follow-up if new/worsening sx  Essential (primary) hypertension; Coronary artery disease of native artery of native heart with stable angina pectoris ([place]); Cardiomyopathy Stable Mgmt per cardiology Remains very compliant  Biochemically recurrent malignant neoplasm of prostate [facility]) Management per urology  I,Alexander Ruley,acting as a scribe for Inda Coke, PA.,have documented all relevant documentation on the behalf of Inda Coke, PA,as directed by  Inda Coke, PA while in the presence of Inda Coke, [place].  I, Inda Coke, [place], have reviewed all documentation for this visit. The documentation on 05/24/22 for the exam, diagnosis, procedures, and orders are all accurate and complete.   Inda Coke, PA-C [place]

## 2022-05-24 ENCOUNTER — Ambulatory Visit (INDEPENDENT_AMBULATORY_CARE_PROVIDER_SITE_OTHER): Payer: Medicare Other | Admitting: Physician Assistant

## 2022-05-24 ENCOUNTER — Encounter: Payer: Self-pay | Admitting: Physician Assistant

## 2022-05-24 VITALS — BP 112/74 | HR 56 | Temp 97.5°F | Resp 16 | Ht 65.55 in | Wt 159.4 lb

## 2022-05-24 DIAGNOSIS — R9721 Rising PSA following treatment for malignant neoplasm of prostate: Secondary | ICD-10-CM

## 2022-05-24 DIAGNOSIS — C61 Malignant neoplasm of prostate: Secondary | ICD-10-CM | POA: Diagnosis not present

## 2022-05-24 DIAGNOSIS — I25118 Atherosclerotic heart disease of native coronary artery with other forms of angina pectoris: Secondary | ICD-10-CM

## 2022-05-24 DIAGNOSIS — H6993 Unspecified Eustachian tube disorder, bilateral: Secondary | ICD-10-CM | POA: Diagnosis not present

## 2022-05-24 DIAGNOSIS — I10 Essential (primary) hypertension: Secondary | ICD-10-CM

## 2022-05-24 DIAGNOSIS — Z0001 Encounter for general adult medical examination with abnormal findings: Secondary | ICD-10-CM

## 2022-05-24 DIAGNOSIS — I429 Cardiomyopathy, unspecified: Secondary | ICD-10-CM

## 2022-05-26 NOTE — Progress Notes (Unsigned)
Cardiology Office Note:    Date:  05/31/2022   ID:  Kurt Reilly, DOB October 05, 1950, MRN [id number]  PCP:  Inda Coke, [place] Providers Cardiologist:  Lenna Sciara, MD Referring MD: Inda Coke, [place]   Chief Complaint/Reason for Referral:  Cardiology follow up  ASSESSMENT:    1. Coronary artery disease of native artery of native heart with stable angina pectoris ([place])   2. Ischemic cardiomyopathy   3. Hyperlipidemia LDL goal <70   4. Essential hypertension   5. ICD (implantable cardioverter-defibrillator) in place   6. Aortic atherosclerosis ([place])   7. CKD (chronic kidney disease) stage 2, GFR 60-89 ml/min     PLAN:    In order of problems listed above: 1.  Coronary artery disease:  Continue ASA, Plavix, and Praluent.  Change Coreg 6.25 twice daily to Toprol-XL 12.5 mg at bedtime.  If possible we will try to push up this dose to help with angina. 2.  Ischemic cardiomyopathy:  Continue Entresto, spironolactone, and Jardiance.  TTE to evaluate EF and MR. Will change to Toprol-XL 12.5 mg and see if we can increase this to help with angina.  Increase Jardiance to 25 mg; the patient just refilled his prescription for 10 mg and have asked him to double up on this until he is ready to refill his prescription. 3.  Hyperlipidemia:  Intolerant of Zetia, statins, and Repatha.  Continue Praluent.  LDL last month was 58.   4.  Hypertension: Blood pressure is well-controlled on his current regimen. 5.  Status post ICD:  Followed by EP.  No ICD shocks 6.  Aortic atherosclerosis continue aspirin, Praluent, and strict blood pressure control. 7.  CKD stage II: Continue Entresto and Jardiance             Dispo:  Return in about 6 months (around 12/01/2022).      Medication Adjustments/Labs and Tests Ordered: Current medicines are reviewed at length with the patient today.  Concerns regarding medicines are outlined above.  The following changes have been made:      Labs/tests ordered: Orders Placed This Encounter  Procedures   ECHOCARDIOGRAM COMPLETE    Medication Changes: Meds ordered this encounter  Medications   metoprolol succinate (TOPROL XL) 25 MG 24 hr tablet    Sig: Take 0.5 tablets (12.5 mg total) by mouth at bedtime.    Dispense:  45 tablet    Refill:  3   empagliflozin (JARDIANCE) 25 MG TABS tablet    Sig: Take 1 tablet (25 mg total) by mouth daily before breakfast.    Dispense:  90 tablet    Refill:  3     Current medicines are reviewed at length with the patient today.  The patient does not have concerns regarding medicines.   History of Present Illness:    FOCUSED PROBLEM LIST:   1.  Coronary artery disease status post PCI of the LAD and bifurcation OM and circumflex remotely; AV Lcx and mid LAD now occluded; anatomy reviewed by Heart Team 2023 and consensus opinion was medical therapy should be pursued 2.  Ischemic cardiomyopathy EF ~35% status post subcutaneous ICD 3.  Hypertension 4.  Hyperlipidemia; intolerant of statins, Zetia, and Repatha; on Praluent 5.  CKD stage II 6.  LV apical aneurysm 7.  Aortic atherosclerosis on CT abd/pelvis 2022    The patient is a 72 y.o. male with the indicated medical history here for cardiology follow up.  He was seen in December in the  cardiology clinic.  At that point in time he was feeling well walking on a treadmill daily for about 45 minutes.  He endorses chest pain when he walks up an incline.  The patient is here today with his wife.  When he exerts himself in the yard such as raking leaves or uses the treadmill going up an incline he does develop angina.  This remits with rest.  He does not have any pain at rest.  He did go to [place] recently and when he was down there after the flight he did notice some ankle edema for a day or 2.  This has not recurred.  He fortunately is not required any emergency room visits or hospitalizations.  He denies any presyncope or syncope.  He has  had no severe bleeding or bruising.  He is otherwise well and tolerating his medications without significant issues.         Current Medications: Current Meds  Medication Sig   Alirocumab (PRALUENT) 150 MG/ML SOAJ Inject 1 mL into the skin every 14 (fourteen) days.   allopurinol (ZYLOPRIM) 300 MG tablet TAKE 1 TABLET BY MOUTH DAILY FOR GOUT   aspirin 81 MG chewable tablet Chew 81 mg by mouth daily.   BLACK ELDERBERRY PO Take 550 mg by mouth once a week.   Cholecalciferol (VITAMIN D-3) 25 MCG (1000 UT) CAPS Take 2,000 Units by mouth daily.   clopidogrel (PLAVIX) 75 MG tablet Take 1 tablet (75 mg total) by mouth daily.   Colchicine 0.6 MG CAPS Take 0.6 mg by mouth daily as needed (gout).   empagliflozin (JARDIANCE) 25 MG TABS tablet Take 1 tablet (25 mg total) by mouth daily before breakfast.   ferrous sulfate 325 (65 FE) MG tablet Take 325 mg by mouth daily with breakfast.   fluticasone (FLONASE) 50 MCG/ACT nasal spray Place 1 spray into both nostrils daily as needed for allergies or rhinitis.   metoprolol succinate (TOPROL XL) 25 MG 24 hr tablet Take 0.5 tablets (12.5 mg total) by mouth at bedtime.   MILK THISTLE PO Take 500 mg by mouth daily.   Multiple Vitamins-Minerals (MULTIVITAMIN WITH MINERALS) tablet Take 1 tablet by mouth daily.   nitroGLYCERIN (NITROSTAT) 0.4 MG SL tablet PLACE 1 TABLET UNDER THE TONGUE EVERY 5 MINUTES AS NEEDED FOR CHEST PAIN   potassium chloride SA (KLOR-CON M20) 20 MEQ tablet TAKE 1 TABLET BY MOUTH ONLY WHEN YOU TAKE THE TORSEMIDE (Patient taking differently: Take 20 mEq by mouth daily as needed (water weight gain). TAKE 1 TABLET BY MOUTH ONLY WHEN YOU TAKE THE TORSEMIDE)   sacubitril-valsartan (ENTRESTO) 24-26 MG Take 1 tablet by mouth 2 (two) times daily.   spironolactone (ALDACTONE) 25 MG tablet Take 0.5 tablets (12.5 mg total) by mouth 3 (three) times a week. Take on Mondays, Wednesdays, and Fridays.   torsemide (DEMADEX) 20 MG tablet Take 20 mg by mouth  daily as needed (water weight gain).   [DISCONTINUED] carvedilol (COREG) 6.25 MG tablet TAKE 1 TABLET BY MOUTH TWICE  DAILY WITH MEALS   [DISCONTINUED] JARDIANCE 10 MG TABS tablet TAKE 1 TABLET BY MOUTH DAILY  BEFORE BREAKFAST     Allergies:    Ezetimibe, Repatha [evolocumab], Statins, and Penicillins   Social History:   Social History   Tobacco Use   Smoking status: Never   Smokeless tobacco: Never  Vaping Use   Vaping Use: Never used  Substance Use Topics   Alcohol use: Yes    Alcohol/week: 2.0 standard drinks of  alcohol    Types: 2 Glasses of wine per week    Comment: 2 glasses of wine per week   Drug use: Never     Family Hx: Family History  Problem Relation Age of Onset   Hypertension Father    High Cholesterol Father    Stomach cancer Father    Bladder Cancer Brother    Diabetes Brother    Yves Dill Parkinson White syndrome Son    Migraines Son    Prostate cancer Neg Hx    Breast cancer Neg Hx    Colon cancer Neg Hx    Pancreatic cancer Neg Hx      Review of Systems:   Please see the history of present illness.    All other systems reviewed and are negative.     EKGs/Labs/Other Test Reviewed:    EKG:  EKG performed 2023 that I personally reviewed demonstrates sinus bradycardia with NSTT   Prior CV studies:  Cardiac Studies & Procedures   CARDIAC CATHETERIZATION  CARDIAC CATHETERIZATION 01/18/2022  Narrative CONCLUSIONS: Separate LAD and circumflex ostia are noted. Ostial to mid LAD stent is patent except for the distal 10 mm where there is total occlusion.  Mid to apical LAD is filled by left to left and right to left collaterals. Ostial to distal circumflex stents ending in a two-stent bifurcation, demonstrating total occlusion of the distal margin of the circumflex in-stent.  Left to right collaterals noted from the patent obtuse marginal arising from the bifurcation stent. Nondominant right coronary supplying collaterals to the apical LAD. Severe  left ventricular dysfunction, segmental involving mid anterior wall to apex and inferoapical segment with some regions of akinesis/dyskinesis.  EDP is 24 mmHg.  EF is 25 to 35%.  RECOMMENDATIONS:  Medical management for now. We will present at heart team conference.  LV function likely excludes him from LAD and distal circumflex territory revascularization.  Total occlusion due to in-stent could be attempted but not likely to be durable. Continue antianginal and systolic heart failure therapy.  Add long-acting nitrate therapy. Present at heart team, 01/20/2022.  Findings Coronary Findings Diagnostic  Dominance: Right  Left Anterior Descending Collaterals Dist LAD filled by collaterals from 2nd Sept.  Collaterals Dist LAD filled by collaterals from Dist RCA.  Mid LAD lesion is 100% stenosed. The lesion was previously treated .  First Diagonal Branch Vessel is small in size.  Second Diagonal Branch 2nd Diag lesion is 70% stenosed.  Left Circumflex Prox Cx to Dist Cx lesion is 25% stenosed. The lesion was previously treated . Dist Cx lesion is 100% stenosed. The lesion was previously treated .  First Obtuse Marginal Branch 1st Mrg lesion is 30% stenosed. The lesion was previously treated .  Second Obtuse Marginal Branch Vessel is small in size.  Third Obtuse Marginal Branch Collaterals 3rd Mrg filled by collaterals from 1st Mrg.  Right Coronary Artery There is mild diffuse disease throughout the vessel.  Intervention  No interventions have been documented.   STRESS TESTS  MYOCARDIAL PERFUSION IMAGING 07/14/2019  Narrative  Nuclear stress EF: 25%. The left ventricular ejection fraction is severely decreased (<30%).  There was no ST segment deviation noted during stress.  Defect 1: There is a large defect of severe severity present in the basal inferoseptal, basal inferior, mid inferoseptal, mid inferior, apical anterior, apical septal, apical inferior, apical  lateral and apex location.  Findings consistent with prior anterioapical and inferior wall myocardial infarction.  There is no evidence of ischemia  ECHOCARDIOGRAM  ECHOCARDIOGRAM COMPLETE 03/02/2021  Narrative ECHOCARDIOGRAM REPORT    Patient Name:   Kurt Reilly    Date of Exam: 03/02/2021 Medical Rec #:  [id number]     Height:       67.0 in Accession #:    [id number]    Weight:       163.6 lb Date of Birth:  07/24/1950      BSA:          1.857 m Patient Age:    68 years      BP:           140/80 mmHg Patient Gender: M             HR:           60 bpm. Exam Location:  [facility]  Procedure: 2D Echo, 3D Echo, Cardiac Doppler and Color Doppler  Indications:    I50.9 CHF  History:        Patient has prior history of Echocardiogram examinations, most recent 04/21/2020. CHF and Cardiomyopathy, CAD and Previous Myocardial Infarction, Abnormal ECG and Pacemaker, Signs/Symptoms:Shortness of Breath and Chest Pain; Risk Factors:Hypertension and Dyslipidemia. Ischemic Cardiomyopathy (prior EF 25-30%), STEMI (2016).  Sonographer:    Deliah Boston RDCS Referring Phys: [place]   1. Compared to prior study in 04/2020 the entire apex now appears to be aneurysmal. The LVEF slightly improved, was 25-30% now 30-35%. Left ventricular ejection fraction, by estimation, is 30 to 35%. The left ventricle has moderately decreased function. The left ventricle has no regional wall motion abnormalities. Left ventricular diastolic parameters are consistent with Grade I diastolic dysfunction (impaired relaxation). 2. Right ventricular systolic function is normal. The right ventricular size is normal. Tricuspid regurgitation signal is inadequate for assessing PA pressure. 3. Left atrial size was mildly dilated. 4. The mitral valve is normal in structure. Mild mitral valve regurgitation. No evidence of mitral stenosis. 5. The aortic valve is tricuspid. Aortic valve regurgitation is  not visualized. Aortic valve sclerosis is present, with no evidence of aortic valve stenosis. 6. The inferior vena cava is normal in size with greater than 50% respiratory variability, suggesting right atrial pressure of 3 mmHg.  FINDINGS Left Ventricle: Compared to prior study in 04/2020 the entire apex now appears to be aneurysmal. The LVEF slightly improved, was 25-30% now 30-35%. Left ventricular ejection fraction, by estimation, is 30 to 35%. The left ventricle has moderately decreased function. The left ventricle has no regional wall motion abnormalities. The left ventricular internal cavity size was normal in size. There is no left ventricular hypertrophy. Left ventricular diastolic parameters are consistent with Grade I diastolic dysfunction (impaired relaxation).   LV Wall Scoring: The entire apex is aneurysmal. The anterior septum, mid inferoseptal segment, and basal inferoseptal segment are akinetic. The anterior wall, antero-lateral wall, inferior wall, and posterior wall are hypokinetic. The.  Right Ventricle: The right ventricular size is normal. No increase in right ventricular wall thickness. Right ventricular systolic function is normal. Tricuspid regurgitation signal is inadequate for assessing PA pressure.  Left Atrium: Left atrial size was mildly dilated.  Right Atrium: Right atrial size was normal in size.  Pericardium: There is no evidence of pericardial effusion. Presence of epicardial fat layer.  Mitral Valve: The mitral valve is normal in structure. Mild mitral valve regurgitation. No evidence of mitral valve stenosis.  Tricuspid Valve: The tricuspid valve is normal in structure. Tricuspid valve regurgitation is not demonstrated. No evidence of tricuspid stenosis.  Aortic Valve: The aortic valve is tricuspid. Aortic valve regurgitation is not visualized. Aortic valve sclerosis is present, with no evidence of aortic valve stenosis.  Pulmonic Valve: The pulmonic  valve was normal in structure. Pulmonic valve regurgitation is not visualized. No evidence of pulmonic stenosis.  Aorta: The aortic root is normal in size and structure.  Venous: The inferior vena cava is normal in size with greater than 50% respiratory variability, suggesting right atrial pressure of 3 mmHg.  IAS/Shunts: No atrial level shunt detected by color flow Doppler.   LEFT VENTRICLE PLAX 2D LVIDd:         5.50 cm   Diastology LVIDs:         4.15 cm   LV e' medial:    8.05 cm/s LV PW:         0.90 cm   LV E/e' medial:  9.6 LV IVS:        0.95 cm   LV e' lateral:   13.80 cm/s LVOT diam:     2.30 cm   LV E/e' lateral: 5.6 LV SV:         75 LV SV Index:   41 LVOT Area:     4.15 cm  3D Volume EF: 3D EF:        40 % LV EDV:       187 ml LV ESV:       112 ml LV SV:        76 ml  RIGHT VENTRICLE RV S prime:     10.30 cm/s TAPSE (M-mode): 2.0 cm  LEFT ATRIUM             Index        RIGHT ATRIUM           Index LA diam:        4.70 cm 2.53 cm/m   RA Area:     14.70 cm LA Vol (A2C):   73.4 ml 39.50 ml/m  RA Volume:   38.90 ml  20.95 ml/m LA Vol (A4C):   65.8 ml 35.44 ml/m LA Biplane Vol: 70.5 ml 37.97 ml/m AORTIC VALVE LVOT Vmax:   84.15 cm/s LVOT Vmean:  53.700 cm/s LVOT VTI:    0.181 m  AORTA Ao Root diam: 3.10 cm Ao Asc diam:  3.50 cm  MITRAL VALVE MV Area (PHT): cm         SHUNTS MV Decel Time: 205 msec    Systemic VTI:  0.18 m MR Peak grad: 115.3 mmHg   Systemic Diam: 2.30 cm MR Mean grad: 70.0 mmHg MR Vmax:      537.00 cm/s MR Vmean:     384.0 cm/s MV E velocity: 77.33 cm/s MV A velocity: 94.55 cm/s MV E/A ratio:  0.82  Kardie Tobb DO Electronically signed by Berniece Salines DO Signature Date/Time: 03/02/2021/3:19:03 PM    Final             Other studies Reviewed: Review of the additional studies/records demonstrates: CT abd/pelvis 2022 with aortic atherosclerosis   Recent Labs: 09/09/2021: NT-Pro BNP 459 01/16/2022: Hemoglobin 15.1;  Platelets 292 04/19/2022: ALT 22; BUN 16; Creatinine, Ser 1.06; Potassium 4.2; Sodium 140   Recent Lipid Panel Lab Results  Component Value Date/Time   CHOL 136 04/19/2022 07:48 AM   TRIG 224 (H) 04/19/2022 07:48 AM   HDL 42 04/19/2022 07:48 AM   LDLCALC 58 04/19/2022 07:48 AM   LDLDIRECT 52.0 05/23/2021 09:01 AM    Risk Assessment/Calculations:  Physical Exam:    VS:  BP 124/80   Pulse (!) 59   Ht 5' 5.5" (1.664 m)   Wt 161 lb 3.2 oz (73.1 kg)   SpO2 99%   BMI 26.42 kg/m    Wt Readings from Last 3 Encounters:  05/31/22 161 lb 3.2 oz (73.1 kg)  05/24/22 159 lb 6.4 oz (72.3 kg)  02/13/22 160 lb (72.6 kg)    GENERAL:  No apparent distress, AOx3 HEENT:  No carotid bruits, +2 carotid impulses, no scleral icterus CAR: RRR no murmurs, gallops, rubs, or thrills RES:  Clear to auscultation bilaterally ABD:  Soft, nontender, nondistended, positive bowel sounds x 4 VASC:  +2 radial pulses, +2 carotid pulses, palpable pedal pulses NEURO:  CN 2-12 grossly intact; motor and sensory grossly intact PSYCH:  No active depression or anxiety EXT:  No edema, ecchymosis, or cyanosis  Signed, Early Osmond, MD  05/31/2022 10:27 AM    [address], [place], [place]  [address] Phone: [phone]; Fax: [phone]   Note:  This document was prepared using Dragon voice recognition software and may include unintentional dictation errors.

## 2022-05-29 ENCOUNTER — Encounter: Payer: Self-pay | Admitting: Physician Assistant

## 2022-05-30 ENCOUNTER — Telehealth: Payer: Self-pay | Admitting: Physician Assistant

## 2022-05-30 ENCOUNTER — Other Ambulatory Visit: Payer: Self-pay | Admitting: Physician Assistant

## 2022-05-30 DIAGNOSIS — R748 Abnormal levels of other serum enzymes: Secondary | ICD-10-CM

## 2022-05-30 DIAGNOSIS — R7309 Other abnormal glucose: Secondary | ICD-10-CM

## 2022-05-30 NOTE — Telephone Encounter (Signed)
Contacted Kurt Reilly to schedule their annual wellness visit. Appointment made for 06/06/2022.  [place] Direct Dial [phone]

## 2022-05-31 ENCOUNTER — Encounter: Payer: Self-pay | Admitting: Internal Medicine

## 2022-05-31 ENCOUNTER — Ambulatory Visit: Payer: Medicare Other | Attending: Internal Medicine | Admitting: Internal Medicine

## 2022-05-31 VITALS — BP 124/80 | HR 59 | Ht 65.5 in | Wt 161.2 lb

## 2022-05-31 DIAGNOSIS — Z95810 Presence of automatic (implantable) cardiac defibrillator: Secondary | ICD-10-CM | POA: Insufficient documentation

## 2022-05-31 DIAGNOSIS — I700 Atherosclerosis of aorta: Secondary | ICD-10-CM | POA: Insufficient documentation

## 2022-05-31 DIAGNOSIS — I255 Ischemic cardiomyopathy: Secondary | ICD-10-CM | POA: Diagnosis not present

## 2022-05-31 DIAGNOSIS — E785 Hyperlipidemia, unspecified: Secondary | ICD-10-CM | POA: Diagnosis not present

## 2022-05-31 DIAGNOSIS — I10 Essential (primary) hypertension: Secondary | ICD-10-CM | POA: Insufficient documentation

## 2022-05-31 DIAGNOSIS — I25118 Atherosclerotic heart disease of native coronary artery with other forms of angina pectoris: Secondary | ICD-10-CM | POA: Insufficient documentation

## 2022-05-31 DIAGNOSIS — N182 Chronic kidney disease, stage 2 (mild): Secondary | ICD-10-CM | POA: Insufficient documentation

## 2022-05-31 MED ORDER — EMPAGLIFLOZIN 25 MG PO TABS: 25.0000 mg | ORAL_TABLET | Freq: Every day | ORAL | 3 refills | Status: DC

## 2022-05-31 MED ORDER — METOPROLOL SUCCINATE ER 25 MG PO TB24: 12.5000 mg | ORAL_TABLET | Freq: Every day | ORAL | 3 refills | Status: DC

## 2022-05-31 NOTE — Patient Instructions (Signed)
Medication Instructions:  Your physician has recommended you make the following change in your medication:  1.) stop carvedilol 2.) start metoprolol succinate (Toprol XL) 25 mg --take HALF TABLET daily at bedtime 3.) increase Jardiance to 25 mg - one tablet daily  *If you need a refill on your cardiac medications before your next appointment, please call your pharmacy*   Lab Work: none   Testing/Procedures: Your physician has requested that you have an echocardiogram. Echocardiography is a painless test that uses sound waves to create images of your heart. It provides your doctor with information about the size and shape of your heart and how well your heart's chambers and valves are working. This procedure takes approximately one hour. There are no restrictions for this procedure. Please do NOT wear cologne, perfume, aftershave, or lotions (deodorant is allowed). Please arrive 15 minutes prior to your appointment time.   Follow-Up: At [facility], you and your health needs are our priority.  As part of our continuing mission to provide you with exceptional heart care, we have created designated Provider Care Teams.  These Care Teams include your primary Cardiologist (physician) and Advanced Practice Providers (APPs -  Physician Assistants and Nurse Practitioners) who all work together to provide you with the care you need, when you need it.  We recommend signing up for the patient portal called "MyChart".  Sign up information is provided on this After Visit Summary.  MyChart is used to connect with patients for Virtual Visits (Telemedicine).  Patients are able to view lab/test results, encounter notes, upcoming appointments, etc.  Non-urgent messages can be sent to your provider as well.   To learn more about what you can do with MyChart, go to [email or web address].    Your next appointment:   6 month(s)  Provider:   Early Osmond, MD

## 2022-06-01 ENCOUNTER — Other Ambulatory Visit (INDEPENDENT_AMBULATORY_CARE_PROVIDER_SITE_OTHER): Payer: Medicare Other

## 2022-06-01 ENCOUNTER — Telehealth: Payer: Self-pay | Admitting: Pharmacist

## 2022-06-01 DIAGNOSIS — R748 Abnormal levels of other serum enzymes: Secondary | ICD-10-CM

## 2022-06-01 DIAGNOSIS — R7309 Other abnormal glucose: Secondary | ICD-10-CM

## 2022-06-01 LAB — COMPREHENSIVE METABOLIC PANEL
ALT: 14 U/L (ref 0–53)
AST: 15 U/L (ref 0–37)
Albumin: 4.5 g/dL (ref 3.5–5.2)
Alkaline Phosphatase: 92 U/L (ref 39–117)
BUN: 24 mg/dL — ABNORMAL HIGH (ref 6–23)
CO2: 28 mEq/L (ref 19–32)
Calcium: 9.2 mg/dL (ref 8.4–10.5)
Chloride: 105 mEq/L (ref 96–112)
Creatinine, Ser: 1.08 mg/dL (ref 0.40–1.50)
GFR: 69.88 mL/min (ref >60.00)
Glucose, Bld: 113 mg/dL — ABNORMAL HIGH (ref 70–99)
Potassium: 4.1 mEq/L (ref 3.5–5.1)
Sodium: 138 mEq/L (ref 135–145)
Total Bilirubin: 0.7 mg/dL (ref 0.2–1.2)
Total Protein: 7 g/dL (ref 6.0–8.3)

## 2022-06-01 LAB — HEMOGLOBIN A1C: Hgb A1c MFr Bld: 5.9 % (ref 4.6–6.5)

## 2022-06-01 NOTE — Progress Notes (Signed)
Care Management & Coordination Services Pharmacy Team  Reason for Encounter: Check on [place] rx  Called and spoke to pharmacist at [facility] about patient's rx. She stated patient picked up medication 05/19/22 for 90 day supply. Price was 362.00 after insurance. This medication was covered by insurance but is not preferred and in a higher tier bracket. There was  no option to try and do PA per pharmacist. CPP updated.  [facility]

## 2022-06-06 ENCOUNTER — Ambulatory Visit (INDEPENDENT_AMBULATORY_CARE_PROVIDER_SITE_OTHER): Payer: Medicare Other

## 2022-06-06 VITALS — Wt 161.0 lb

## 2022-06-06 DIAGNOSIS — Z0000 Encounter for general adult medical examination without abnormal findings: Secondary | ICD-10-CM

## 2022-06-06 NOTE — Progress Notes (Signed)
I connected with  Kurt Reilly on 06/06/22 by a audio enabled telemedicine application and verified that I am speaking with the correct person using two identifiers.  Patient Location: Home  Provider Location: Office/Clinic  I discussed the limitations of evaluation and management by telemedicine. The patient expressed understanding and agreed to proceed.    Patient Medicare AWV questionnaire was completed by the patient on 06/05/22; I have confirmed that all information answered by patient is correct and no changes since this date.        Subjective:   Kurt Reilly is a 72 y.o. male who presents for an Initial Medicare Annual Wellness Visit.  Review of Systems     Cardiac Risk Factors include: advanced age (>47men, >85 women);dyslipidemia;male gender;hypertension     Objective:    Today's Vitals   06/06/22 1041  Weight: 161 lb (73 kg)   Body mass index is 26.38 kg/m.     06/06/2022   10:52 AM 01/18/2022   12:51 PM 01/09/2021    7:02 AM 11/17/2020    3:29 PM 07/13/2020    8:15 AM 06/07/2020    8:47 AM 10/29/2019    3:38 PM  Advanced Directives  Does Patient Have a Medical Advance Directive? Yes Yes No Yes No Yes No  Type of Paramedic of [place];Living will Living will  Living will  [place]   Does patient want to make changes to medical advance directive?    No - Patient declined     Copy of [place] in Chart? No - copy requested        Would patient like information on creating a medical advance directive?   No - Patient declined  No - Patient declined  No - Patient declined    Current Medications (verified) Outpatient Encounter Medications as of 06/06/2022  Medication Sig   Alirocumab (PRALUENT) 150 MG/ML SOAJ Inject 1 mL into the skin every 14 (fourteen) days.   allopurinol (ZYLOPRIM) 300 MG tablet TAKE 1 TABLET BY MOUTH DAILY FOR GOUT   aspirin 81 MG chewable tablet Chew 81 mg by mouth daily.   BLACK  ELDERBERRY PO Take 550 mg by mouth once a week.   Cholecalciferol (VITAMIN D-3) 25 MCG (1000 UT) CAPS Take 2,000 Units by mouth daily.   clopidogrel (PLAVIX) 75 MG tablet Take 1 tablet (75 mg total) by mouth daily.   Colchicine 0.6 MG CAPS Take 0.6 mg by mouth daily as needed (gout).   empagliflozin (JARDIANCE) 25 MG TABS tablet Take 1 tablet (25 mg total) by mouth daily before breakfast.   ferrous sulfate 325 (65 FE) MG tablet Take 325 mg by mouth daily with breakfast.   fluticasone (FLONASE) 50 MCG/ACT nasal spray Place 1 spray into both nostrils daily as needed for allergies or rhinitis.   metoprolol succinate (TOPROL XL) 25 MG 24 hr tablet Take 0.5 tablets (12.5 mg total) by mouth at bedtime.   MILK THISTLE PO Take 500 mg by mouth daily.   Multiple Vitamins-Minerals (MULTIVITAMIN WITH MINERALS) tablet Take 1 tablet by mouth daily.   nitroGLYCERIN (NITROSTAT) 0.4 MG SL tablet PLACE 1 TABLET UNDER THE TONGUE EVERY 5 MINUTES AS NEEDED FOR CHEST PAIN   potassium chloride SA (KLOR-CON M20) 20 MEQ tablet TAKE 1 TABLET BY MOUTH ONLY WHEN YOU TAKE THE TORSEMIDE (Patient taking differently: Take 20 mEq by mouth daily as needed (water weight gain). TAKE 1 TABLET BY MOUTH ONLY WHEN YOU TAKE THE TORSEMIDE)  sacubitril-valsartan (ENTRESTO) 24-26 MG Take 1 tablet by mouth 2 (two) times daily.   spironolactone (ALDACTONE) 25 MG tablet Take 0.5 tablets (12.5 mg total) by mouth 3 (three) times a week. Take on Mondays, Wednesdays, and Fridays.   torsemide (DEMADEX) 20 MG tablet Take 20 mg by mouth daily as needed (water weight gain).   No facility-administered encounter medications on file as of 06/06/2022.    Allergies (verified) Ezetimibe, Repatha [evolocumab], Statins, and Penicillins   History: Past Medical History:  Diagnosis Date   Actinic keratosis    Alkaline phosphatase raised    Anemia    Blood transfusion without reported diagnosis 2016   Cardiac arrhythmia    Cardiomyopathy    CHF  (congestive heart failure)    Coronary arteriosclerosis    ED (erectile dysfunction)    Gout 03/09/2017   Hemangioma 08/24/2018   Hyperlipidemia    Hypertension    Hypertrophic scar    Malignant neoplasm of prostate 2013   Was told that less than 1% of cancer   Melanocytic nevi of trunk    Melanocytic nevus of skin    Senile hyperkeratosis    STEMI (ST elevation myocardial infarction) 2016   Past Surgical History:  Procedure Laterality Date   ICD IMPLANT     LEFT HEART CATH AND CORONARY ANGIOGRAPHY N/A 01/18/2022   Procedure: LEFT HEART CATH AND CORONARY ANGIOGRAPHY;  Surgeon: Belva Crome, MD;  Location: [facility];  Service: Cardiovascular;  Laterality: N/A;   PROSTATECTOMY  2013   SUBQ ICD CHANGEOUT N/A 06/07/2020   Procedure: [place];  Surgeon: Evans Lance, MD;  Location: [facility];  Service: Cardiovascular;  Laterality: N/A;   Family History  Problem Relation Age of Onset   Hypertension Father    High Cholesterol Father    Stomach cancer Father    Bladder Cancer Brother    Diabetes Brother    Yves Dill Parkinson White syndrome Son    Migraines Son    Prostate cancer Neg Hx    Breast cancer Neg Hx    Colon cancer Neg Hx    Pancreatic cancer Neg Hx    Social History   Socioeconomic History   Marital status: Married    Spouse name: [name]   Number of children: 3   Years of education: Not on file   Highest education level: Not on file  Occupational History    Comment: retired  Tobacco Use   Smoking status: Never   Smokeless tobacco: Never  Vaping Use   Vaping Use: Never used  Substance and Sexual Activity   Alcohol use: Yes    Alcohol/week: 2.0 standard drinks of alcohol    Types: 2 Glasses of wine per week    Comment: 2 glasses of wine per week   Drug use: Never   Sexual activity: Yes    Birth control/protection: None  Other Topics Concern   Not on file  Social History Narrative   Moved to [place] in Oct 2020 to be closer to  grandchildren   Retired IT sales professional, Clinical biochemist, Training and development officer   Married   3 sons   Social Determinants of Health   Financial Resource Strain: [place]  (06/05/2022)   Overall Financial Resource Strain ([place])    Difficulty of Paying Living Expenses: Not hard at all  Food Insecurity: No Food Insecurity (06/05/2022)   Hunger Vital Sign    Worried About Running Out of Food in the Last Year: Never true  Ran Out of Food in the Last Year: Never true  Transportation Needs: No Transportation Needs (06/05/2022)   PRAPARE - Hydrologist (Medical): No    Lack of Transportation (Non-Medical): No  Physical Activity: Sufficiently Active (06/05/2022)   Exercise Vital Sign    Days of Exercise per Week: 6 days    Minutes of Exercise per Session: 50 min  Stress: No Stress Concern Present (06/05/2022)   [place]    Feeling of Stress : Not at all  Social Connections: Moderately Integrated (06/05/2022)   Social Connection and Isolation Panel [NHANES]    Frequency of Communication with Friends and Family: More than three times a week    Frequency of Social Gatherings with Friends and Family: Once a week    Attends Religious Services: More than 4 times per year    Active Member of [employer] or Organizations: No    Attends Music therapist: Never    Marital Status: Married    Tobacco Counseling Counseling given: Not Answered   Clinical Intake:  Pre-visit preparation completed: Yes  Pain : No/denies pain     BMI - recorded: 26.38 Nutritional Status: BMI 25 -29 Overweight Nutritional Risks: None Diabetes: No  How often do you need to have someone help you when you read instructions, pamphlets, or other written materials from your doctor or pharmacy?: 1 - Never  Diabetic?no  Interpreter Needed?: No  Information entered by :: Charlott Rakes, LPN   Activities of Daily Living     06/05/2022    8:35 PM  In your present state of health, do you have any difficulty performing the following activities:  Hearing? 0  Vision? 0  Difficulty concentrating or making decisions? 0  Walking or climbing stairs? 0  Dressing or bathing? 0  Doing errands, shopping? 0  Preparing Food and eating ? N  Using the Toilet? N  In the past six months, have you accidently leaked urine? N  Do you have problems with loss of bowel control? N  Managing your Medications? N  Managing your Finances? N  Housekeeping or managing your Housekeeping? N    Patient Care Team: Inda Coke, [place] as PCP - General (Physician Assistant) Early Osmond, MD as PCP - Cardiology (Cardiology) Lyndee Hensen, PT as Physical Therapist (Physical Therapy) Irine Seal, MD as Attending Physician (Urology) Tyler Pita, MD as Consulting Physician (Radiation Oncology) Delice Bison Charlestine Massed, NP as Nurse Practitioner (Hematology and Oncology) Harmon Pier, RN as Registered Nurse Edythe Clarity, [facility] as Pharmacist (Pharmacist)  Indicate any recent Medical Services you may have received from other than [facility] providers in the past year (date may be approximate).     Assessment:   This is a routine wellness examination for Kurt Reilly.  Hearing/Vision screen Hearing Screening - Comments:: Pt denies any hearing issues  Vision Screening - Comments:: Pt [facility] for annual ye exams   Dietary issues and exercise activities discussed: Current Exercise Habits: Home exercise routine, Type of exercise: Other - see comments, Time (Minutes): 50, Frequency (Times/Week): 6, Weekly Exercise (Minutes/Week): 300   Goals Addressed             This Visit's Progress    Patient Stated       Stay active and healthy       Depression Screen    06/06/2022   10:43 AM 05/24/2022   10:59 AM 03/15/2021  12:05 PM 11/10/2020   11:20 AM 08/25/2019    1:25 PM  PHQ 2/9 Scores  PHQ - 2 Score 0 0 0 0 0   PHQ- 9 Score 0 0       Fall Risk    06/05/2022    8:35 PM 05/24/2022   10:59 AM 05/23/2021    8:10 AM 11/10/2020   11:20 AM 08/25/2019    1:24 PM  Fall Risk   Falls in the past year? 1 0 1 1 0  Number falls in past yr: 0 0 0 1 0  Injury with Fall? 0 0 1 1 0  Comment   broke ribs and clavical    Risk for fall due to :  No Fall Risks History of fall(s)    Follow up Falls prevention discussed  Falls evaluation completed  Falls evaluation completed    [place]:  Any stairs in or around the home? Yes  If so, are there any without handrails? No  Home free of loose throw rugs in walkways, pet beds, electrical cords, etc? Yes  Adequate lighting in your home to reduce risk of falls? Yes   ASSISTIVE DEVICES UTILIZED TO PREVENT FALLS:  Life alert? No  Use of a cane, walker or w/c? No  Grab bars in the bathroom? No  Shower chair or bench in shower? No  Elevated toilet seat or a handicapped toilet? No   TIMED UP AND GO:  Was the test performed? No .  Cognitive Function:declined        Immunizations Immunization History  Administered Date(s) Administered   Fluad Quad(high Dose 65+) 02/06/2020, 02/10/2021   Influenza,inj,Quad PF,6+ Mos 03/01/2022   PFIZER(Purple Top)SARS-COV-2 Vaccination 05/10/2019, 05/31/2019   Tdap 02/04/2015    TDAP status: Up to date  Flu Vaccine status: Up to date  Pneumococcal vaccine status: Due, Education has been provided regarding the importance of this vaccine. Advised may receive this vaccine at local pharmacy or Health Dept. Aware to provide a copy of the vaccination record if obtained from local pharmacy or Health Dept. Verbalized acceptance and understanding.  Covid-19 vaccine status: Completed vaccines  Qualifies for Shingles Vaccine? No    Screening Tests Health Maintenance  Topic Date Due   COVID-19 Vaccine (3 - Pfizer risk series) 06/09/2022 (Originally 06/28/2019)   Pneumonia Vaccine 84+ Years old (1  of 1 - PCV) 06/06/2023 (Originally 08/12/2017)   COLONOSCOPY (Pts 45-11yrs Insurance coverage will need to be confirmed)  03/06/2026 (Originally 03/04/2014)   INFLUENZA VACCINE  10/05/2022   Medicare Annual Wellness (AWV)  06/06/2023   DTaP/Tdap/Td (2 - Td or Tdap) 02/03/2025   Hepatitis C Screening  Completed   HPV VACCINES  Aged Out   Zoster Vaccines- Shingrix  Discontinued    Health Maintenance  There are no preventive care reminders to display for this patient.   Colorectal cancer screening: Type of screening: Colonoscopy. Completed 03/04/04. Repeat every pt postponed due every 10 years  years   Additional Screening:  Hepatitis C Screening: Completed 02/06/20  Vision Screening: Recommended annual ophthalmology exams for early detection of glaucoma and other disorders of the eye. Is the patient up to date with their annual eye exam?  Yes  Who is the provider or what is the name of the office in which the patient attends annual eye exams? [facility]  If pt is not established with a provider, would they like to be referred to a provider to establish care? No .  Dental Screening: Recommended annual dental exams for proper oral hygiene  Community Resource Referral / Chronic Care Management: CRR required this visit?  No   CCM required this visit?  No      Plan:     I have personally reviewed and noted the following in the patient's chart:   Medical and social history Use of alcohol, tobacco or illicit drugs  Current medications and supplements including opioid prescriptions. Patient is not currently taking opioid prescriptions. Functional ability and status Nutritional status Physical activity Advanced directives List of other physicians Hospitalizations, surgeries, and ER visits in previous 12 months Vitals Screenings to include cognitive, depression, and falls Referrals and appointments  In addition, I have reviewed and discussed with patient certain preventive  protocols, quality metrics, and best practice recommendations. A written personalized care plan for preventive services as well as general preventive health recommendations were provided to patient.     Willette Brace, LPN   [id number]   Nurse Notes: Pt declined cognitive testing at this time pt was alert and oriented and knowledgeable of questions asked

## 2022-06-06 NOTE — Patient Instructions (Signed)
Kurt Reilly , Thank you for taking time to come for your Medicare Wellness Visit. I appreciate your ongoing commitment to your health goals. Please review the following plan we discussed and let me know if I can assist you in the future.   These are the goals we discussed:  Goals      LDL CALC < 70     Track and Manage Fluids and Swelling-Heart Failure     Timeframe:  Long-Range Goal Priority:  High Start Date:     06/10/21                        Expected End Date:    12/10/21                   Follow Up Date 09/09/21    - call office if I gain more than 2 pounds in one day or 5 pounds in one week - use salt in moderation    Why is this important?   It is important to check your weight daily and watch how much salt and liquids you have.  It will help you to manage your heart failure.    Notes:         This is a list of the screening recommended for you and due dates:  Health Maintenance  Topic Date Due   Pneumonia Vaccine (1 of 1 - PCV) Never done   COVID-19 Vaccine (3 - Pfizer risk series) 06/09/2022*   Colon Cancer Screening  03/06/2026*   Flu Shot  10/05/2022   Medicare Annual Wellness Visit  06/06/2023   DTaP/Tdap/Td vaccine (2 - Td or Tdap) 02/03/2025   Hepatitis C Screening: USPSTF Recommendation to screen - Ages 8-79 yo.  Completed   HPV Vaccine  Aged Out   Zoster (Shingles) Vaccine  Discontinued  *Topic was postponed. The date shown is not the original due date.    Advanced directives: Please bring a copy of your health care power of attorney and living will to the office at your convenience.  Conditions/risks identified: stay healthy and active   Next appointment: Follow up in one year for your annual wellness visit.   Preventive Care 70 Years and Older, Male  Preventive care refers to lifestyle choices and visits with your health care provider that can promote health and wellness. What does preventive care include? A yearly physical exam. This is also called an  annual well check. Dental exams once or twice a year. Routine eye exams. Ask your health care provider how often you should have your eyes checked. Personal lifestyle choices, including: Daily care of your teeth and gums. Regular physical activity. Eating a healthy diet. Avoiding tobacco and drug use. Limiting alcohol use. Practicing safe sex. Taking low doses of aspirin every day. Taking vitamin and mineral supplements as recommended by your health care provider. What happens during an annual well check? The services and screenings done by your health care provider during your annual well check will depend on your age, overall health, lifestyle risk factors, and family history of disease. Counseling  Your health care provider may ask you questions about your: Alcohol use. Tobacco use. Drug use. Emotional well-being. Home and relationship well-being. Sexual activity. Eating habits. History of falls. Memory and ability to understand (cognition). Work and work Statistician. Screening  You may have the following tests or measurements: Height, weight, and BMI. Blood pressure. Lipid and cholesterol levels. These may be checked every 5  years, or more frequently if you are over 30 years old. Skin check. Lung cancer screening. You may have this screening every year starting at age 81 if you have a 30-pack-year history of smoking and currently smoke or have quit within the past 15 years. Fecal occult blood test (FOBT) of the stool. You may have this test every year starting at age 21. Flexible sigmoidoscopy or colonoscopy. You may have a sigmoidoscopy every 5 years or a colonoscopy every 10 years starting at age 34. Prostate cancer screening. Recommendations will vary depending on your family history and other risks. Hepatitis C blood test. Hepatitis B blood test. Sexually transmitted disease (STD) testing. Diabetes screening. This is done by checking your blood sugar (glucose) after you  have not eaten for a while (fasting). You may have this done every 1-3 years. Abdominal aortic aneurysm (AAA) screening. You may need this if you are a current or former smoker. Osteoporosis. You may be screened starting at age 66 if you are at high risk. Talk with your health care provider about your test results, treatment options, and if necessary, the need for more tests. Vaccines  Your health care provider may recommend certain vaccines, such as: Influenza vaccine. This is recommended every year. Tetanus, diphtheria, and acellular pertussis (Tdap, Td) vaccine. You may need a Td booster every 10 years. Zoster vaccine. You may need this after age 2. Pneumococcal 13-valent conjugate (PCV13) vaccine. One dose is recommended after age 88. Pneumococcal polysaccharide (PPSV23) vaccine. One dose is recommended after age 25. Talk to your health care provider about which screenings and vaccines you need and how often you need them. This information is not intended to replace advice given to you by your health care provider. Make sure you discuss any questions you have with your health care provider. Document Released: 03/19/2015 Document Revised: 11/10/2015 Document Reviewed: 12/22/2014 Elsevier Interactive Patient Education  2017 [place] Prevention in the Home Falls can cause injuries. They can happen to people of all ages. There are many things you can do to make your home safe and to help prevent falls. What can I do on the outside of my home? Regularly fix the edges of walkways and driveways and fix any cracks. Remove anything that might make you trip as you walk through a door, such as a raised step or threshold. Trim any bushes or trees on the path to your home. Use bright outdoor lighting. Clear any walking paths of anything that might make someone trip, such as rocks or tools. Regularly check to see if handrails are loose or broken. Make sure that both sides of any steps have  handrails. Any raised decks and porches should have guardrails on the edges. Have any leaves, snow, or ice cleared regularly. Use sand or salt on walking paths during winter. Clean up any spills in your garage right away. This includes oil or grease spills. What can I do in the bathroom? Use night lights. Install grab bars by the toilet and in the tub and shower. Do not use towel bars as grab bars. Use non-skid mats or decals in the tub or shower. If you need to sit down in the shower, use a plastic, non-slip stool. Keep the floor dry. Clean up any water that spills on the floor as soon as it happens. Remove soap buildup in the tub or shower regularly. Attach bath mats securely with double-sided non-slip rug tape. Do not have throw rugs and other things on the  floor that can make you trip. What can I do in the bedroom? Use night lights. Make sure that you have a light by your bed that is easy to reach. Do not use any sheets or blankets that are too big for your bed. They should not hang down onto the floor. Have a firm chair that has side arms. You can use this for support while you get dressed. Do not have throw rugs and other things on the floor that can make you trip. What can I do in the kitchen? Clean up any spills right away. Avoid walking on wet floors. Keep items that you use a lot in easy-to-reach places. If you need to reach something above you, use a strong step stool that has a grab bar. Keep electrical cords out of the way. Do not use floor polish or wax that makes floors slippery. If you must use wax, use non-skid floor wax. Do not have throw rugs and other things on the floor that can make you trip. What can I do with my stairs? Do not leave any items on the stairs. Make sure that there are handrails on both sides of the stairs and use them. Fix handrails that are broken or loose. Make sure that handrails are as long as the stairways. Check any carpeting to make sure  that it is firmly attached to the stairs. Fix any carpet that is loose or worn. Avoid having throw rugs at the top or bottom of the stairs. If you do have throw rugs, attach them to the floor with carpet tape. Make sure that you have a light switch at the top of the stairs and the bottom of the stairs. If you do not have them, ask someone to add them for you. What else can I do to help prevent falls? Wear shoes that: Do not have high heels. Have rubber bottoms. Are comfortable and fit you well. Are closed at the toe. Do not wear sandals. If you use a stepladder: Make sure that it is fully opened. Do not climb a closed stepladder. Make sure that both sides of the stepladder are locked into place. Ask someone to hold it for you, if possible. Clearly mark and make sure that you can see: Any grab bars or handrails. First and last steps. Where the edge of each step is. Use tools that help you move around (mobility aids) if they are needed. These include: Canes. Walkers. Scooters. Crutches. Turn on the lights when you go into a dark area. Replace any light bulbs as soon as they burn out. Set up your furniture so you have a clear path. Avoid moving your furniture around. If any of your floors are uneven, fix them. If there are any pets around you, be aware of where they are. Review your medicines with your doctor. Some medicines can make you feel dizzy. This can increase your chance of falling. Ask your doctor what other things that you can do to help prevent falls. This information is not intended to replace advice given to you by your health care provider. Make sure you discuss any questions you have with your health care provider. Document Released: 12/17/2008 Document Revised: 07/29/2015 Document Reviewed: 03/27/2014 Elsevier Interactive Patient Education  2017 [employer].

## 2022-06-06 NOTE — Progress Notes (Signed)
I connected with  Kurt Reilly on 06/06/22 by a audio enabled telemedicine application and verified that I am speaking with the correct person using two identifiers.  Patient Location: Home  Provider Location: Office/Clinic  I discussed the limitations of evaluation and management by telemedicine. The patient expressed understanding and agreed to proceed.    Patient Medicare AWV questionnaire was completed by the patient on 06/05/22; I have confirmed that all information answered by patient is correct and no changes since this date.        Subjective:   Kurt Reilly is a 72 y.o. male who presents for an Initial Medicare Annual Wellness Visit.  Review of Systems     Cardiac Risk Factors include: advanced age (>86men, >89 women);dyslipidemia;male gender;hypertension     Objective:    Today's Vitals   06/06/22 1041  Weight: 161 lb (73 kg)   Body mass index is 26.38 kg/m.     06/06/2022   10:52 AM 01/18/2022   12:51 PM 01/09/2021    7:02 AM 11/17/2020    3:29 PM 07/13/2020    8:15 AM 06/07/2020    8:47 AM 10/29/2019    3:38 PM  Advanced Directives  Does Patient Have a Medical Advance Directive? Yes Yes No Yes No Yes No  Type of Paramedic of [place];Living will Living will  Living will  [place]   Does patient want to make changes to medical advance directive?    No - Patient declined     Copy of [place] in Chart? No - copy requested        Would patient like information on creating a medical advance directive?   No - Patient declined  No - Patient declined  No - Patient declined    Current Medications (verified) Outpatient Encounter Medications as of 06/06/2022  Medication Sig   Alirocumab (PRALUENT) 150 MG/ML SOAJ Inject 1 mL into the skin every 14 (fourteen) days.   allopurinol (ZYLOPRIM) 300 MG tablet TAKE 1 TABLET BY MOUTH DAILY FOR GOUT   aspirin 81 MG chewable tablet Chew 81 mg by mouth daily.   BLACK  ELDERBERRY PO Take 550 mg by mouth once a week.   Cholecalciferol (VITAMIN D-3) 25 MCG (1000 UT) CAPS Take 2,000 Units by mouth daily.   clopidogrel (PLAVIX) 75 MG tablet Take 1 tablet (75 mg total) by mouth daily.   Colchicine 0.6 MG CAPS Take 0.6 mg by mouth daily as needed (gout).   empagliflozin (JARDIANCE) 25 MG TABS tablet Take 1 tablet (25 mg total) by mouth daily before breakfast.   ferrous sulfate 325 (65 FE) MG tablet Take 325 mg by mouth daily with breakfast.   fluticasone (FLONASE) 50 MCG/ACT nasal spray Place 1 spray into both nostrils daily as needed for allergies or rhinitis.   metoprolol succinate (TOPROL XL) 25 MG 24 hr tablet Take 0.5 tablets (12.5 mg total) by mouth at bedtime.   MILK THISTLE PO Take 500 mg by mouth daily.   Multiple Vitamins-Minerals (MULTIVITAMIN WITH MINERALS) tablet Take 1 tablet by mouth daily.   nitroGLYCERIN (NITROSTAT) 0.4 MG SL tablet PLACE 1 TABLET UNDER THE TONGUE EVERY 5 MINUTES AS NEEDED FOR CHEST PAIN   potassium chloride SA (KLOR-CON M20) 20 MEQ tablet TAKE 1 TABLET BY MOUTH ONLY WHEN YOU TAKE THE TORSEMIDE (Patient taking differently: Take 20 mEq by mouth daily as needed (water weight gain). TAKE 1 TABLET BY MOUTH ONLY WHEN YOU TAKE THE TORSEMIDE)  sacubitril-valsartan (ENTRESTO) 24-26 MG Take 1 tablet by mouth 2 (two) times daily.   spironolactone (ALDACTONE) 25 MG tablet Take 0.5 tablets (12.5 mg total) by mouth 3 (three) times a week. Take on Mondays, Wednesdays, and Fridays.   torsemide (DEMADEX) 20 MG tablet Take 20 mg by mouth daily as needed (water weight gain).   No facility-administered encounter medications on file as of 06/06/2022.    Allergies (verified) Ezetimibe, Repatha [evolocumab], Statins, and Penicillins   History: Past Medical History:  Diagnosis Date   Actinic keratosis    Alkaline phosphatase raised    Anemia    Blood transfusion without reported diagnosis 2016   Cardiac arrhythmia    Cardiomyopathy    CHF  (congestive heart failure)    Coronary arteriosclerosis    ED (erectile dysfunction)    Gout 03/09/2017   Hemangioma 08/24/2018   Hyperlipidemia    Hypertension    Hypertrophic scar    Malignant neoplasm of prostate 2013   Was told that less than 1% of cancer   Melanocytic nevi of trunk    Melanocytic nevus of skin    Senile hyperkeratosis    STEMI (ST elevation myocardial infarction) 2016   Past Surgical History:  Procedure Laterality Date   ICD IMPLANT     LEFT HEART CATH AND CORONARY ANGIOGRAPHY N/A 01/18/2022   Procedure: LEFT HEART CATH AND CORONARY ANGIOGRAPHY;  Surgeon: Belva Crome, MD;  Location: [facility];  Service: Cardiovascular;  Laterality: N/A;   PROSTATECTOMY  2013   SUBQ ICD CHANGEOUT N/A 06/07/2020   Procedure: [place];  Surgeon: Evans Lance, MD;  Location: [facility];  Service: Cardiovascular;  Laterality: N/A;   Family History  Problem Relation Age of Onset   Hypertension Father    High Cholesterol Father    Stomach cancer Father    Bladder Cancer Brother    Diabetes Brother    Yves Dill Parkinson White syndrome Son    Migraines Son    Prostate cancer Neg Hx    Breast cancer Neg Hx    Colon cancer Neg Hx    Pancreatic cancer Neg Hx    Social History   Socioeconomic History   Marital status: Married    Spouse name: [name]   Number of children: 3   Years of education: Not on file   Highest education level: Not on file  Occupational History    Comment: retired  Tobacco Use   Smoking status: Never   Smokeless tobacco: Never  Vaping Use   Vaping Use: Never used  Substance and Sexual Activity   Alcohol use: Yes    Alcohol/week: 2.0 standard drinks of alcohol    Types: 2 Glasses of wine per week    Comment: 2 glasses of wine per week   Drug use: Never   Sexual activity: Yes    Birth control/protection: None  Other Topics Concern   Not on file  Social History Narrative   Moved to [place] in Oct 2020 to be closer to  grandchildren   Retired IT sales professional, Clinical biochemist, Training and development officer   Married   3 sons   Social Determinants of Health   Financial Resource Strain: [place]  (06/05/2022)   Overall Financial Resource Strain ([place])    Difficulty of Paying Living Expenses: Not hard at all  Food Insecurity: No Food Insecurity (06/05/2022)   Hunger Vital Sign    Worried About Running Out of Food in the Last Year: Never true  Ran Out of Food in the Last Year: Never true  Transportation Needs: No Transportation Needs (06/05/2022)   PRAPARE - Hydrologist (Medical): No    Lack of Transportation (Non-Medical): No  Physical Activity: Sufficiently Active (06/05/2022)   Exercise Vital Sign    Days of Exercise per Week: 6 days    Minutes of Exercise per Session: 50 min  Stress: No Stress Concern Present (06/05/2022)   [place]    Feeling of Stress : Not at all  Social Connections: Moderately Integrated (06/05/2022)   Social Connection and Isolation Panel [NHANES]    Frequency of Communication with Friends and Family: More than three times a week    Frequency of Social Gatherings with Friends and Family: Once a week    Attends Religious Services: More than 4 times per year    Active Member of [employer] or Organizations: No    Attends Music therapist: Never    Marital Status: Married    Tobacco Counseling Counseling given: Not Answered   Clinical Intake:  Pre-visit preparation completed: Yes  Pain : No/denies pain     BMI - recorded: 26.38 Nutritional Status: BMI 25 -29 Overweight Nutritional Risks: None Diabetes: No  How often do you need to have someone help you when you read instructions, pamphlets, or other written materials from your doctor or pharmacy?: 1 - Never  Diabetic?no  Interpreter Needed?: No  Information entered by :: Charlott Rakes, LPN   Activities of Daily Living     06/05/2022    8:35 PM  In your present state of health, do you have any difficulty performing the following activities:  Hearing? 0  Vision? 0  Difficulty concentrating or making decisions? 0  Walking or climbing stairs? 0  Dressing or bathing? 0  Doing errands, shopping? 0  Preparing Food and eating ? N  Using the Toilet? N  In the past six months, have you accidently leaked urine? N  Do you have problems with loss of bowel control? N  Managing your Medications? N  Managing your Finances? N  Housekeeping or managing your Housekeeping? N    Patient Care Team: Inda Coke, [place] as PCP - General (Physician Assistant) Early Osmond, MD as PCP - Cardiology (Cardiology) Lyndee Hensen, PT as Physical Therapist (Physical Therapy) Irine Seal, MD as Attending Physician (Urology) Tyler Pita, MD as Consulting Physician (Radiation Oncology) Delice Bison Charlestine Massed, NP as Nurse Practitioner (Hematology and Oncology) Harmon Pier, RN as Registered Nurse Edythe Clarity, [facility] as Pharmacist (Pharmacist)  Indicate any recent Medical Services you may have received from other than [facility] providers in the past year (date may be approximate).     Assessment:   This is a routine wellness examination for Kurt Reilly.  Hearing/Vision screen Hearing Screening - Comments:: Pt denies any hearing issues  Vision Screening - Comments:: Pt [facility] for annual ye exams   Dietary issues and exercise activities discussed: Current Exercise Habits: Home exercise routine, Type of exercise: Other - see comments, Time (Minutes): 50, Frequency (Times/Week): 6, Weekly Exercise (Minutes/Week): 300   Goals Addressed             This Visit's Progress    Patient Stated       Stay active and healthy        Depression Screen    06/06/2022   10:43 AM 05/24/2022   10:59 AM 03/15/2021  12:05 PM 11/10/2020   11:20 AM 08/25/2019    1:25 PM  PHQ 2/9 Scores  PHQ - 2 Score 0 0 0 0 0   PHQ- 9 Score 0 0       Fall Risk    06/05/2022    8:35 PM 05/24/2022   10:59 AM 05/23/2021    8:10 AM 11/10/2020   11:20 AM 08/25/2019    1:24 PM  Fall Risk   Falls in the past year? 1 0 1 1 0  Number falls in past yr: 0 0 0 1 0  Injury with Fall? 0 0 1 1 0  Comment   broke ribs and clavical    Risk for fall due to :  No Fall Risks History of fall(s)    Follow up Falls prevention discussed  Falls evaluation completed  Falls evaluation completed    [place]:  Any stairs in or around the home? Yes  If so, are there any without handrails? No  Home free of loose throw rugs in walkways, pet beds, electrical cords, etc? Yes  Adequate lighting in your home to reduce risk of falls? Yes   ASSISTIVE DEVICES UTILIZED TO PREVENT FALLS:  Life alert? No  Use of a cane, walker or w/c? No  Grab bars in the bathroom? No  Shower chair or bench in shower? No  Elevated toilet seat or a handicapped toilet? No   TIMED UP AND GO:  Was the test performed? No .  Cognitive Function:        Immunizations Immunization History  Administered Date(s) Administered   Fluad Quad(high Dose 65+) 02/06/2020, 02/10/2021   Influenza,inj,Quad PF,6+ Mos 03/01/2022   PFIZER(Purple Top)SARS-COV-2 Vaccination 05/10/2019, 05/31/2019   Tdap 02/04/2015    TDAP status: Up to date  Flu Vaccine status: Up to date  Pneumococcal vaccine status: Due, Education has been provided regarding the importance of this vaccine. Advised may receive this vaccine at local pharmacy or Health Dept. Aware to provide a copy of the vaccination record if obtained from local pharmacy or Health Dept. Verbalized acceptance and understanding.  Covid-19 vaccine status: Completed vaccines  Qualifies for Shingles Vaccine? No    Screening Tests Health Maintenance  Topic Date Due   COVID-19 Vaccine (3 - Pfizer risk series) 06/09/2022 (Originally 06/28/2019)   Pneumonia Vaccine 23+ Years old (1 of 1 -  PCV) 06/06/2023 (Originally 08/12/2017)   COLONOSCOPY (Pts 45-44yrs Insurance coverage will need to be confirmed)  03/06/2026 (Originally 03/04/2014)   INFLUENZA VACCINE  10/05/2022   Medicare Annual Wellness (AWV)  06/06/2023   DTaP/Tdap/Td (2 - Td or Tdap) 02/03/2025   Hepatitis C Screening  Completed   HPV VACCINES  Aged Out   Zoster Vaccines- Shingrix  Discontinued    Health Maintenance  There are no preventive care reminders to display for this patient.   Colorectal cancer screening: Type of screening: Colonoscopy. Completed 03/04/04. Repeat every pt postponed due every 10 years  years   Additional Screening:  Hepatitis C Screening: Completed 02/06/20  Vision Screening: Recommended annual ophthalmology exams for early detection of glaucoma and other disorders of the eye. Is the patient up to date with their annual eye exam?  Yes  Who is the provider or what is the name of the office in which the patient attends annual eye exams? [facility]  If pt is not established with a provider, would they like to be referred to a provider to establish care? No .  Dental Screening: Recommended annual dental exams for proper oral hygiene  Community Resource Referral / Chronic Care Management: CRR required this visit?  No   CCM required this visit?  No      Plan:     I have personally reviewed and noted the following in the patient's chart:   Medical and social history Use of alcohol, tobacco or illicit drugs  Current medications and supplements including opioid prescriptions. Patient is not currently taking opioid prescriptions. Functional ability and status Nutritional status Physical activity Advanced directives List of other physicians Hospitalizations, surgeries, and ER visits in previous 12 months Vitals Screenings to include cognitive, depression, and falls Referrals and appointments  In addition, I have reviewed and discussed with patient certain preventive  protocols, quality metrics, and best practice recommendations. A written personalized care plan for preventive services as well as general preventive health recommendations were provided to patient.     Willette Brace, LPN   [id number]   Nurse Notes: none

## 2022-06-19 ENCOUNTER — Telehealth: Payer: Medicare Other

## 2022-06-30 ENCOUNTER — Ambulatory Visit (HOSPITAL_COMMUNITY): Payer: Medicare Other | Attending: Cardiology

## 2022-06-30 DIAGNOSIS — I25118 Atherosclerotic heart disease of native coronary artery with other forms of angina pectoris: Secondary | ICD-10-CM | POA: Insufficient documentation

## 2022-06-30 DIAGNOSIS — I255 Ischemic cardiomyopathy: Secondary | ICD-10-CM

## 2022-06-30 LAB — ECHOCARDIOGRAM COMPLETE
Area-P 1/2: 4.19 cm2
S' Lateral: 3.7 cm

## 2022-06-30 MED ORDER — PERFLUTREN LIPID MICROSPHERE
1.0000 mL | INTRAVENOUS | Status: AC | PRN
  Administered 2022-06-30: 2 mL via INTRAVENOUS

## 2022-07-04 ENCOUNTER — Encounter: Payer: Self-pay | Admitting: Internal Medicine

## 2022-07-04 DIAGNOSIS — I255 Ischemic cardiomyopathy: Secondary | ICD-10-CM

## 2022-07-05 ENCOUNTER — Telehealth: Payer: Self-pay | Admitting: Internal Medicine

## 2022-07-05 MED ORDER — EMPAGLIFLOZIN 10 MG PO TABS: 10.0000 mg | ORAL_TABLET | Freq: Every day | ORAL | 3 refills | Status: DC

## 2022-07-05 MED ORDER — APIXABAN 5 MG PO TABS: 5.0000 mg | ORAL_TABLET | Freq: Two times a day (BID) | ORAL | 3 refills | Status: DC

## 2022-07-05 MED ORDER — EMPAGLIFLOZIN 10 MG PO TABS: 10.0000 mg | ORAL_TABLET | Freq: Every day | ORAL | 0 refills | Status: DC

## 2022-07-05 MED ORDER — APIXABAN 5 MG PO TABS: 5.0000 mg | ORAL_TABLET | Freq: Two times a day (BID) | ORAL | 0 refills | Status: DC

## 2022-07-05 NOTE — Telephone Encounter (Signed)
Patient is requesting to speak with Nani Skillern, RN, regarding a co-pay card for Eliquis.

## 2022-07-05 NOTE — Telephone Encounter (Signed)
Samples of Jardiance and Eliquis as well as copay cards provided to patient who came by the office and picked them up at this time.   [facility].  Called [facility] and requested they cancel them from their store.  Pt grateful for assistance.

## 2022-07-05 NOTE — Telephone Encounter (Signed)
Eliquis refill sent to requested pharmacy.  

## 2022-07-05 NOTE — Telephone Encounter (Signed)
Aspirin discontinued.  Prescription for Eliquis 5 mg twice daily sent to pharmacy and per the echo report will send new script for lower dose Jardiance 10 mg daily.  Order placed for Echo w [facility] in 3 months to evaluate for LV thrombus and EF.  ___________________________________________________  This was discussed by phone and updated via patient portal between Dr. Lynnette Caffey and the patient.

## 2022-07-05 NOTE — Telephone Encounter (Signed)
Pt stated MD called him yesterday and advised he start Eliquis 5 mg twice daily but he wanted to make sure that the prescription is being sent to the right pharmacy instead of the mail order pharmacy so that he doesn't have to wait for it in the mail. He'd like to start it right away. [facility] to:  [id number] [place],  - [address] AT [facility] [address] [facility] [address] Phone: [phone]  Fax: [phone]

## 2022-07-13 ENCOUNTER — Telehealth: Payer: Self-pay | Admitting: Internal Medicine

## 2022-07-13 NOTE — Telephone Encounter (Signed)
[facility].  The prescription was on hold for them.  They ran it through insurance while I was holding and they are getting it ready now.  They will notify patient.

## 2022-07-13 NOTE — Telephone Encounter (Signed)
Pt c/o medication issue:  1. Name of Medication: apixaban (ELIQUIS) 5 MG TABS tablet   2. How are you currently taking this medication (dosage and times per day)? As prescribed   3. Are you having a reaction (difficulty breathing--STAT)? No   4. What is your medication issue? Pharmacy did not receive prescription.

## 2022-07-24 ENCOUNTER — Ambulatory Visit (INDEPENDENT_AMBULATORY_CARE_PROVIDER_SITE_OTHER): Payer: Medicare Other

## 2022-07-24 DIAGNOSIS — I255 Ischemic cardiomyopathy: Secondary | ICD-10-CM

## 2022-07-25 LAB — CUP PACEART REMOTE DEVICE CHECK
Battery Remaining Percentage: 77 %
Date Time Interrogation Session: 20240520083700
Implantable Lead Connection Status: 753985
Implantable Lead Implant Date: 20170727
Implantable Lead Location: 753862
Implantable Lead Model: 3401
Implantable Pulse Generator Implant Date: 20220404
Pulse Gen Serial Number: 156707

## 2022-08-20 ENCOUNTER — Encounter: Payer: Self-pay | Admitting: Internal Medicine

## 2022-08-21 NOTE — Progress Notes (Signed)
Remote ICD transmission.   

## 2022-08-29 ENCOUNTER — Other Ambulatory Visit: Payer: Self-pay | Admitting: *Deleted

## 2022-08-29 DIAGNOSIS — M545 Low back pain, unspecified: Secondary | ICD-10-CM

## 2022-08-29 MED ORDER — ALLOPURINOL 300 MG PO TABS
ORAL_TABLET | ORAL | 3 refills | Status: DC
Start: 2022-08-29 — End: 2023-08-14

## 2022-08-31 ENCOUNTER — Other Ambulatory Visit (HOSPITAL_COMMUNITY): Payer: Self-pay

## 2022-08-31 ENCOUNTER — Telehealth: Payer: Self-pay

## 2022-08-31 ENCOUNTER — Telehealth: Payer: Self-pay | Admitting: Pharmacist

## 2022-08-31 NOTE — Telephone Encounter (Signed)
Pharmacy Patient Advocate Encounter  Prior Authorization for PRALUENT 150 MG/ML  has been approved.    Effective dates: 08/17/22 UNTIL FURTHER NOTICE  Haze Rushing, CPhT Pharmacy Patient Advocate Specialist Direct Number: [phone] Fax: [phone]

## 2022-08-31 NOTE — Telephone Encounter (Signed)
Patient called, reports his pharmacy told him to get his PA for Praluent renewed.Will initiate PA renewal for Praluent 150 mg.

## 2022-08-31 NOTE — Telephone Encounter (Signed)
Pharmacy Patient Advocate Encounter   Received notification from [facility] MEDICARE that prior authorization for PRALUENT 150 MG/ML is needed.    PA submitted on 08/31/22 Key [id number] Status is pending  Haze Rushing, CPhT Pharmacy Patient Advocate Specialist Direct Number: [phone] Fax: [phone]

## 2022-08-31 NOTE — Telephone Encounter (Signed)
PA initiated, please see separate encounter for updates on determination. (I will route you back in once a decision has been made)  Keveon Amsler, CPhT Pharmacy Patient Advocate Specialist Direct Number: [phone] Fax: [phone]  

## 2022-09-01 NOTE — Telephone Encounter (Signed)
PA renewed for Praluent 150 mg, patient made aware.

## 2022-09-15 ENCOUNTER — Other Ambulatory Visit (HOSPITAL_BASED_OUTPATIENT_CLINIC_OR_DEPARTMENT_OTHER): Payer: Self-pay

## 2022-09-15 ENCOUNTER — Emergency Department (HOSPITAL_BASED_OUTPATIENT_CLINIC_OR_DEPARTMENT_OTHER): Payer: Medicare Other

## 2022-09-15 ENCOUNTER — Other Ambulatory Visit: Payer: Self-pay

## 2022-09-15 ENCOUNTER — Encounter (HOSPITAL_BASED_OUTPATIENT_CLINIC_OR_DEPARTMENT_OTHER): Payer: Self-pay | Admitting: Emergency Medicine

## 2022-09-15 ENCOUNTER — Emergency Department (HOSPITAL_BASED_OUTPATIENT_CLINIC_OR_DEPARTMENT_OTHER)
Admission: EM | Admit: 2022-09-15 | Discharge: 2022-09-15 | Disposition: A | Discharge status: Home / Self Care | Payer: Medicare Other | Attending: Emergency Medicine | Admitting: Emergency Medicine

## 2022-09-15 DIAGNOSIS — Z7901 Long term (current) use of anticoagulants: Secondary | ICD-10-CM | POA: Insufficient documentation

## 2022-09-15 DIAGNOSIS — I2510 Atherosclerotic heart disease of native coronary artery without angina pectoris: Secondary | ICD-10-CM | POA: Diagnosis not present

## 2022-09-15 DIAGNOSIS — M25461 Effusion, right knee: Secondary | ICD-10-CM | POA: Insufficient documentation

## 2022-09-15 DIAGNOSIS — Z7902 Long term (current) use of antithrombotics/antiplatelets: Secondary | ICD-10-CM | POA: Insufficient documentation

## 2022-09-15 DIAGNOSIS — M25561 Pain in right knee: Secondary | ICD-10-CM | POA: Diagnosis not present

## 2022-09-15 MED ORDER — PREDNISONE 20 MG PO TABS: ORAL_TABLET | ORAL | 0 refills | Status: AC

## 2022-09-15 MED ORDER — OXYCODONE-ACETAMINOPHEN 5-325 MG PO TABS
1.0000 | ORAL_TABLET | Freq: Once | ORAL | Status: DC
  Filled 2022-09-15: qty 1

## 2022-09-15 MED ORDER — OXYCODONE-ACETAMINOPHEN 5-325 MG PO TABS: 1.0000 | ORAL_TABLET | Freq: Four times a day (QID) | ORAL | 0 refills | Status: DC | PRN

## 2022-09-15 NOTE — ED Notes (Signed)
Patient verbalizes understanding of discharge instructions. Opportunity for questioning and answers were provided. Patient discharged from ED.  °

## 2022-09-15 NOTE — ED Provider Notes (Signed)
[place] EMERGENCY DEPARTMENT AT [facility] Provider Note   CSN: [id number] Arrival date & time: 09/15/22  1329     History  Chief Complaint  Patient presents with   Knee Injury    Kurt Reilly is a 72 y.o. male, history of gout, CAD, who presents to the ED secondary to right knee swelling, and pain has been going on for the last week.  He states that for the last week he has had a lot of discomfort and tenderness to his right knee, that is deep inside, and then last night when he was walking he felt like it exploded.  He states is very swollen now, and tender to the touch.  Is having difficulty sleeping at night because the pain is so bad.    Home Medications Prior to Admission medications   Medication Sig Start Date End Date Taking? Authorizing Provider  oxyCODONE-acetaminophen (PERCOCET/ROXICET) 5-325 MG tablet Take 1 tablet by mouth every 6 (six) hours as needed for severe pain. 09/15/22  Yes [name], PA  predniSONE (DELTASONE) 20 MG tablet Take 3 tablets (60 mg total) by mouth daily with breakfast for 3 days, THEN 2 tablets (40 mg total) daily with breakfast for 2 days, THEN 1 tablet (20 mg total) daily with breakfast for 2 days, THEN 0.5 tablets (10 mg total) daily with breakfast for 2 days. 09/15/22 09/24/22 Yes [name], PA  Alirocumab (PRALUENT) 150 MG/ML SOAJ Inject 1 mL into the skin every 14 (fourteen) days. 05/16/22   Orbie Pyo, MD  allopurinol (ZYLOPRIM) 300 MG tablet TAKE 1 TABLET BY MOUTH DAILY FOR GOUT 08/29/22   Jarold Motto, PA  apixaban (ELIQUIS) 5 MG TABS tablet Take 1 tablet (5 mg total) by mouth 2 (two) times daily. 07/05/22   Orbie Pyo, MD  apixaban (ELIQUIS) 5 MG TABS tablet Take 1 tablet (5 mg total) by mouth 2 (two) times daily. 07/05/22   Orbie Pyo, MD  BLACK ELDERBERRY PO Take 550 mg by mouth once a week.    [provider]  Cholecalciferol (VITAMIN D-3) 25 MCG (1000 UT) CAPS Take 2,000 Units by mouth daily.     [provider]  clopidogrel (PLAVIX) 75 MG tablet Take 1 tablet (75 mg total) by mouth daily. 04/25/22   Swinyer, Zachary George, NP  Colchicine 0.6 MG CAPS Take 0.6 mg by mouth daily as needed (gout).    [provider]  empagliflozin (JARDIANCE) 10 MG TABS tablet Take 1 tablet (10 mg total) by mouth daily before breakfast. 07/05/22   Orbie Pyo, MD  empagliflozin (JARDIANCE) 10 MG TABS tablet Take 1 tablet (10 mg total) by mouth daily before breakfast. 07/05/22   Orbie Pyo, MD  ferrous sulfate 325 (65 FE) MG tablet Take 325 mg by mouth daily with breakfast.    [provider]  fluticasone (FLONASE) 50 MCG/ACT nasal spray Place 1 spray into both nostrils daily as needed for allergies or rhinitis.    [provider]  metoprolol succinate (TOPROL XL) 25 MG 24 hr tablet Take 0.5 tablets (12.5 mg total) by mouth at bedtime. 05/31/22   Orbie Pyo, MD  MILK THISTLE PO Take 500 mg by mouth daily.    [provider]  Multiple Vitamins-Minerals (MULTIVITAMIN WITH MINERALS) tablet Take 1 tablet by mouth daily.    [provider]  nitroGLYCERIN (NITROSTAT) 0.4 MG SL tablet PLACE 1 TABLET UNDER THE TONGUE EVERY 5 MINUTES AS NEEDED FOR CHEST  PAIN 04/11/21   Lyn Records, MD  potassium chloride SA (KLOR-CON M20) 20 MEQ tablet TAKE 1 TABLET BY MOUTH ONLY WHEN YOU TAKE THE TORSEMIDE Patient taking differently: Take 20 mEq by mouth daily as needed (water weight gain). TAKE 1 TABLET BY MOUTH ONLY WHEN YOU TAKE THE TORSEMIDE 07/11/21   Bensimhon, Bevelyn Buckles, MD  sacubitril-valsartan (ENTRESTO) 24-26 MG Take 1 tablet by mouth 2 (two) times daily. 04/25/22   Swinyer, Zachary George, NP  spironolactone (ALDACTONE) 25 MG tablet Take 0.5 tablets (12.5 mg total) by mouth 3 (three) times a week. Take on Mondays, Wednesdays, and Fridays. 02/03/22   Lyn Records, MD  torsemide (DEMADEX) 20 MG tablet Take 20 mg by mouth daily as needed (water weight gain).    [provider]      Allergies    Ezetimibe, Repatha [evolocumab], Statins, and Penicillins    Review of Systems   Review of Systems  Musculoskeletal:  Positive for joint swelling.  Skin:  Negative for rash.    Physical Exam Updated Vital Signs BP (!) 160/78 (BP Location: Right Arm)   Pulse 64   Temp 98.5 F (36.9 C) (Oral)   Resp 18   Ht 5' 5.5" (1.664 m)   Wt 69.9 kg   SpO2 94%   BMI 25.24 kg/m  Physical Exam Vitals and nursing note reviewed.  Constitutional:      General: He is not in acute distress.    Appearance: He is well-developed.  HENT:     Head: Normocephalic and atraumatic.  Eyes:     General:        Right eye: No discharge.        Left eye: No discharge.     Conjunctiva/sclera: Conjunctivae normal.  Pulmonary:     Effort: No respiratory distress.  Musculoskeletal:     Comments: Right Knee: Tenderness to palpation of entire knee. An effusion is present.  Negative anterior and posterior drawer. Negative Mcmurray's. +Patellar stability. Negative valgus and varus stress test.Extension and flexion intact. No sensory deficits.  No erythema, edema, or warmth.  No open lesions.   Neurological:     Mental Status: He is alert.     Comments: Clear speech.   Psychiatric:        Behavior: Behavior normal.        Thought Content: Thought content normal.     ED Results / Procedures / Treatments   Labs (all labs ordered are listed, but only abnormal results are displayed) Labs Reviewed - No data to display  EKG None  Radiology DG Knee Complete 4 Views Right  Result Date: 09/15/2022 CLINICAL DATA:  Acute knee pain EXAM: RIGHT KNEE - COMPLETE 4+ VIEW COMPARISON:  None Available. FINDINGS: No evidence of fracture or dislocation. Moderate-large knee joint effusion. No layering fat-fluid level is identified. No evidence of arthropathy or other focal bone abnormality. Soft tissues are unremarkable. IMPRESSION: Moderate-large knee joint effusion, nonspecific. No  acute fracture or dislocation. Electronically Signed   By: Duanne Guess D.O.   On: 09/15/2022 15:15    Procedures Procedures    Medications Ordered in ED Medications  oxyCODONE-acetaminophen (PERCOCET/ROXICET) 5-325 MG per tablet 1 tablet (has no administration in time range)    ED Course/ Medical Decision Making/ A&P                             Medical Decision Making Patient is a 72 year old male,  here for swelling of his right knee that is been going on for the last week.  Its become more swollen the last day, but has been tender over the last week.  Denies any trauma, no redness, warmth, no history of IV drug use.  Is having difficulty sleeping because the pain is so bad.  We will obtain x-ray for further evaluation.  He has not had any trauma.  Amount and/or Complexity of Data Reviewed Radiology: ordered.    Details: X-ray shows moderate to large joint effusion. Discussion of management or test interpretation with external provider(s): Patient is a 72 year old male, history of gout, here for right knee swelling has been going on for about a week, he had some pain in the right knee, that has now progressed to an effusion.  No redness, swelling, trauma to the area.  His range of motion is intact.  Is not red or hot.  I am not suspicious for an infected joint, however I do think that he will need to follow-up with orthopedics, for drainage of the area.  We provided him with a knee brace, and I started on prednisone for that, as I am suspicious for possible gout versus pseudogout.  I do not think this is this is a meniscal in nature, arthritic as he has no evidence of any arthritis on his x-ray, and his range of motion is intact and he has no laxity of his joints, to suggest a meniscal tear.  Discussed return precautions short dose of pain meds sent to the pharmacy    Final Clinical Impression(s) / ED Diagnoses Final diagnoses:  Knee effusion, right    Rx / DC Orders ED Discharge  Orders          Ordered    oxyCODONE-acetaminophen (PERCOCET/ROXICET) 5-325 MG tablet  Every 6 hours PRN        09/15/22 1618    predniSONE (DELTASONE) 20 MG tablet  Q breakfast        09/15/22 1618              Ozias Dicenzo L, PA 09/15/22 1618    Melene Plan, DO 09/15/22 1910

## 2022-09-15 NOTE — ED Triage Notes (Signed)
Pt arrives to ED with c/o right knee injury. Pt notes that last night he was walking and developed sudden severe right knee pain. Denies falls.

## 2022-09-15 NOTE — Discharge Instructions (Addendum)
Please follow-up with your primary care doctor, return to the ER if you feel like your condition is worsening.  You will need to also follow-up with orthopedics, they will be able to drain your knee effusion.  I provided you with a short dose of pain medications as well as prednisone, as I believe this may be possibly gout related.  Return to the ER if you feel like your condition is getting worse.

## 2022-09-18 ENCOUNTER — Telehealth: Payer: Self-pay | Admitting: Internal Medicine

## 2022-09-18 ENCOUNTER — Telehealth: Payer: Self-pay | Admitting: Physician Assistant

## 2022-09-18 NOTE — Telephone Encounter (Signed)
Spoke to pt's wife, told her Lelon Mast said, Recommend starting prednisone as prescribed.   Based on ER evaluation -- likely needs orthopedics or sports medicine to drain his knee.  [facility] may have some openings as soon as tomorrow -- if possible, I would like to place urgent referral for him to be seen by them. Does not need to see [place] Wednesday if he is able to see them urgently. Mrs. Ambrocio verbalized understanding and said swelling in knee has gone done and Sports Medicine already called and declined an appt. Told her okay, please make sure he starts the Prednisone. Mrs. Langille verbalized understanding.

## 2022-09-18 NOTE — Telephone Encounter (Signed)
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 Pt has never had this only regular gout. Pt was on Colchicine for his gout in the past and wants to know if he can take this now for his problem. Also pt was given Prednisone in the ED, but pt is afraid to take due to being treated for blood clot in his heart at present. Pt would like to know if he should take the Prednisone? Told him I will discuss with Lelon Mast and get back to him. Pt and wife verbalized understanding.

## 2022-09-18 NOTE — Telephone Encounter (Signed)
Please see message and advise. Pt has f/u on Wed.

## 2022-09-18 NOTE — Telephone Encounter (Signed)
Ok for him to take.

## 2022-09-18 NOTE — Telephone Encounter (Signed)
Called and spoke w patients. Wife.  Adv okay for him to take the prednisone.  Appreciative for assistance.

## 2022-09-18 NOTE — Telephone Encounter (Signed)
Pt c/o medication issue:  1. Name of Medication:   predniSONE (DELTASONE) 20 MG tablet    2. How are you currently taking this medication (dosage and times per day)? Not currently taking   3. Are you having a reaction (difficulty breathing--STAT)?   4. What is your medication issue? Patient's wife is requesting call back to get advice as to if it will be okay for patient to take this medication.

## 2022-09-19 NOTE — Progress Notes (Signed)
Kurt Reilly is a 72 y.o. male here for a new problem.  History of Present Illness:   No chief complaint on file.   HPI  Right Knee Swelling/Pain Seen in ED on 09/15/22 for right knee swelling and pain. Had been having severe pain for several days leading up to ED visit. X-ray right knee showed: 1) Moderate-large knee joint effusion, nonspecific, 2) No acute fracture or dislocation. Treated conservatively with Percocet. Denies trauma. Having difficulty sleeping due to pain. Prescribed Percocet, prednisone on discharge.   Past Medical History:  Diagnosis Date   Actinic keratosis    Alkaline phosphatase raised    Anemia    Blood transfusion without reported diagnosis 2016   Cardiac arrhythmia    Cardiomyopathy [facility])    CHF (congestive heart failure) (HCC)    Coronary arteriosclerosis    ED (erectile dysfunction)    Gout 03/09/2017   Hemangioma 08/24/2018   Hyperlipidemia    Hypertension    Hypertrophic scar    Malignant neoplasm of prostate (HCC) 2013   Was told that less than 1% of cancer   Melanocytic nevi of trunk    Melanocytic nevus of skin    Senile hyperkeratosis    STEMI (ST elevation myocardial infarction) (HCC) 2016     Social History   Tobacco Use   Smoking status: Never   Smokeless tobacco: Never  Vaping Use   Vaping status: Never Used  Substance Use Topics   Alcohol use: Yes    Alcohol/week: 2.0 standard drinks of alcohol    Types: 2 Glasses of wine per week    Comment: 2 glasses of wine per week   Drug use: Never    Past Surgical History:  Procedure Laterality Date   ICD IMPLANT     LEFT HEART CATH AND CORONARY ANGIOGRAPHY N/A 01/18/2022   Procedure: LEFT HEART CATH AND CORONARY ANGIOGRAPHY;  Surgeon: Lyn Records, MD;  Location: [facility] INVASIVE CV LAB;  Service: Cardiovascular;  Laterality: N/A;   PROSTATECTOMY  2013   SUBQ ICD CHANGEOUT N/A 06/07/2020   Procedure: SUBQ ICD CHANGEOUT;  Surgeon: Marinus Maw, MD;  Location: [facility] INVASIVE CV LAB;   Service: Cardiovascular;  Laterality: N/A;    Family History  Problem Relation Age of Onset   Hypertension Father    High Cholesterol Father    Stomach cancer Father    Bladder Cancer Brother    Diabetes Brother    Evelene Croon Parkinson White syndrome Son    Migraines Son    Prostate cancer Neg Hx    Breast cancer Neg Hx    Colon cancer Neg Hx    Pancreatic cancer Neg Hx     Allergies  Allergen Reactions   Ezetimibe Other (See Comments)    Muscle aches    Repatha [Evolocumab]     myalgias   Statins Other (See Comments)    Myalgia   Penicillins Rash    Occurred as a child - tolerated Zosyn June 2016     Current Medications:   Current Outpatient Medications:    Alirocumab (PRALUENT) 150 MG/ML SOAJ, Inject 1 mL into the skin every 14 (fourteen) days., Disp: 6 mL, Rfl: 3   allopurinol (ZYLOPRIM) 300 MG tablet, TAKE 1 TABLET BY MOUTH DAILY FOR GOUT, Disp: 90 tablet, Rfl: 3   apixaban (ELIQUIS) 5 MG TABS tablet, Take 1 tablet (5 mg total) by mouth 2 (two) times daily., Disp: 60 tablet, Rfl: 3   apixaban (ELIQUIS) 5 MG TABS tablet, Take 1  tablet (5 mg total) by mouth 2 (two) times daily., Disp: 56 tablet, Rfl: 0   BLACK ELDERBERRY PO, Take 550 mg by mouth once a week., Disp: , Rfl:    Cholecalciferol (VITAMIN D-3) 25 MCG (1000 UT) CAPS, Take 2,000 Units by mouth daily., Disp: , Rfl:    clopidogrel (PLAVIX) 75 MG tablet, Take 1 tablet (75 mg total) by mouth daily., Disp: 90 tablet, Rfl: 3   Colchicine 0.6 MG CAPS, Take 0.6 mg by mouth daily as needed (gout)., Disp: , Rfl:    empagliflozin (JARDIANCE) 10 MG TABS tablet, Take 1 tablet (10 mg total) by mouth daily before breakfast., Disp: 30 tablet, Rfl: 3   empagliflozin (JARDIANCE) 10 MG TABS tablet, Take 1 tablet (10 mg total) by mouth daily before breakfast., Disp: 28 tablet, Rfl: 0   ferrous sulfate 325 (65 FE) MG tablet, Take 325 mg by mouth daily with breakfast., Disp: , Rfl:    fluticasone (FLONASE) 50 MCG/ACT nasal spray, Place 1  spray into both nostrils daily as needed for allergies or rhinitis., Disp: , Rfl:    metoprolol succinate (TOPROL XL) 25 MG 24 hr tablet, Take 0.5 tablets (12.5 mg total) by mouth at bedtime., Disp: 45 tablet, Rfl: 3   MILK THISTLE PO, Take 500 mg by mouth daily., Disp: , Rfl:    Multiple Vitamins-Minerals (MULTIVITAMIN WITH MINERALS) tablet, Take 1 tablet by mouth daily., Disp: , Rfl:    nitroGLYCERIN (NITROSTAT) 0.4 MG SL tablet, PLACE 1 TABLET UNDER THE TONGUE EVERY 5 MINUTES AS NEEDED FOR CHEST PAIN, Disp: 25 tablet, Rfl: 11   oxyCODONE-acetaminophen (PERCOCET/ROXICET) 5-325 MG tablet, Take 1 tablet by mouth every 6 (six) hours as needed for severe pain., Disp: 15 tablet, Rfl: 0   potassium chloride SA (KLOR-CON M20) 20 MEQ tablet, TAKE 1 TABLET BY MOUTH ONLY WHEN YOU TAKE THE TORSEMIDE (Patient taking differently: Take 20 mEq by mouth daily as needed (water weight gain). TAKE 1 TABLET BY MOUTH ONLY WHEN YOU TAKE THE TORSEMIDE), Disp: 90 tablet, Rfl: 1   predniSONE (DELTASONE) 20 MG tablet, Take 3 tablets (60 mg total) by mouth daily with breakfast for 3 days, THEN 2 tablets (40 mg total) daily with breakfast for 2 days, THEN 1 tablet (20 mg total) daily with breakfast for 2 days, THEN 0.5 tablets (10 mg total) daily with breakfast for 2 days., Disp: 16 tablet, Rfl: 0   sacubitril-valsartan (ENTRESTO) 24-26 MG, Take 1 tablet by mouth 2 (two) times daily., Disp: 180 tablet, Rfl: 3   spironolactone (ALDACTONE) 25 MG tablet, Take 0.5 tablets (12.5 mg total) by mouth 3 (three) times a week. Take on Mondays, Wednesdays, and Fridays., Disp: 45 tablet, Rfl: 3   torsemide (DEMADEX) 20 MG tablet, Take 20 mg by mouth daily as needed (water weight gain)., Disp: , Rfl:    Review of Systems:   ROS  Vitals:   There were no vitals filed for this visit.   There is no height or weight on file to calculate BMI.  Physical Exam:   Physical Exam  Assessment and Plan:   ***   I,Alexander Ruley,acting as  a scribe for Jarold Motto, PA.,have documented all relevant documentation on the behalf of Jarold Motto, PA,as directed by  Jarold Motto, PA while in the presence of Jarold Motto, [name].   ***  Jarold Motto, PA-C

## 2022-09-20 ENCOUNTER — Ambulatory Visit: Payer: Medicare Other | Admitting: Physician Assistant

## 2022-09-20 VITALS — BP 126/70 | HR 54 | Temp 97.5°F | Ht 65.5 in | Wt 157.2 lb

## 2022-09-20 DIAGNOSIS — M25561 Pain in right knee: Secondary | ICD-10-CM | POA: Diagnosis not present

## 2022-09-20 MED ORDER — COLCHICINE 0.6 MG PO TABS: ORAL_TABLET | ORAL | 1 refills | Status: DC

## 2022-09-21 ENCOUNTER — Telehealth: Payer: Self-pay

## 2022-09-21 NOTE — Telephone Encounter (Signed)
Transition Care Management Follow-up Telephone Call Date of discharge and from where: [facility] 7/12 How have you been since you were released from the hospital? Doing better Any questions or concerns? No  Items Reviewed: Did the pt receive and understand the discharge instructions provided? Yes  Medications obtained and verified? Yes  Other? No  Any new allergies since your discharge? No  Dietary orders reviewed? No Do you have support at home? Yes     Follow up appointments reviewed:  PCP Hospital f/u appt confirmed? Yes  Scheduled to see  on  @ . Specialist Hospital f/u appt confirmed? Yes  Scheduled to see  on  @ . Are transportation arrangements needed? No  If their condition worsens, is the pt aware to call PCP or go to the Emergency Dept.? Yes Was the patient provided with contact information for the PCP's office or ED? Yes Was to pt encouraged to call back with questions or concerns? Yes

## 2022-09-26 ENCOUNTER — Encounter: Payer: Self-pay | Admitting: Internal Medicine

## 2022-09-26 NOTE — Telephone Encounter (Signed)
Will review w Dr. Lynnette Caffey.  I cannot see in chart where Toprol XL was changed back to carvedilol, but per his message he was not tolerating it.

## 2022-09-28 MED ORDER — CARVEDILOL 6.25 MG PO TABS: 6.2500 mg | ORAL_TABLET | Freq: Two times a day (BID) | ORAL | 3 refills | Status: DC

## 2022-09-28 NOTE — Telephone Encounter (Signed)
Sent refill for Coreg 6.25 mg BID to [facility].

## 2022-10-10 ENCOUNTER — Ambulatory Visit (HOSPITAL_COMMUNITY): Payer: Medicare Other | Attending: Internal Medicine

## 2022-10-10 DIAGNOSIS — I255 Ischemic cardiomyopathy: Secondary | ICD-10-CM | POA: Diagnosis not present

## 2022-10-10 LAB — ECHOCARDIOGRAM COMPLETE
Area-P 1/2: 3.42 cm2
S' Lateral: 3.4 cm

## 2022-10-10 MED ORDER — PERFLUTREN LIPID MICROSPHERE
1.0000 mL | INTRAVENOUS | Status: AC | PRN
Start: 2022-10-10 — End: 2022-10-10
  Administered 2022-10-10: 2 mL via INTRAVENOUS

## 2022-10-11 ENCOUNTER — Telehealth: Payer: Self-pay | Admitting: *Deleted

## 2022-10-11 DIAGNOSIS — I513 Intracardiac thrombosis, not elsewhere classified: Secondary | ICD-10-CM

## 2022-10-11 MED ORDER — ASPIRIN 81 MG PO TBEC: 81.0000 mg | DELAYED_RELEASE_TABLET | Freq: Every day | ORAL | Status: AC

## 2022-10-11 NOTE — Telephone Encounter (Signed)
Pt in office today accompanying a family member.  Spoke w Dr. Lynnette Caffey:   Lynnette Caffey, Charlies Constable, MD  Lendon Ka, RN Stop Eliquis and restart aspirin and Plavix.  The patient needs a limited echocardiogram with [facility] in 3 months to assess for thrombus. ________________________________________________________  Reviewed this w patient who voices understanding and agreement.   Pt already has Plavix at home.  New prescription is not needed at this time.

## 2022-10-14 ENCOUNTER — Other Ambulatory Visit: Payer: Self-pay | Admitting: Physician Assistant

## 2022-10-23 ENCOUNTER — Ambulatory Visit (INDEPENDENT_AMBULATORY_CARE_PROVIDER_SITE_OTHER): Payer: Medicare Other

## 2022-10-23 DIAGNOSIS — I255 Ischemic cardiomyopathy: Secondary | ICD-10-CM | POA: Diagnosis not present

## 2022-10-23 LAB — CUP PACEART REMOTE DEVICE CHECK
Battery Remaining Percentage: 74 %
Date Time Interrogation Session: 20240819144200
HighPow Impedance: 90 Ohm
Implantable Lead Connection Status: 753985
Implantable Lead Implant Date: 20170727
Implantable Lead Location: 753862
Implantable Lead Model: 3401
Implantable Pulse Generator Implant Date: 20220404
Pulse Gen Serial Number: 156707

## 2022-10-24 ENCOUNTER — Encounter: Payer: Self-pay | Admitting: Physician Assistant

## 2022-10-24 DIAGNOSIS — M1A0610 Idiopathic chronic gout, right knee, without tophus (tophi): Secondary | ICD-10-CM

## 2022-10-24 DIAGNOSIS — M25561 Pain in right knee: Secondary | ICD-10-CM

## 2022-10-31 ENCOUNTER — Ambulatory Visit: Payer: Medicare Other | Admitting: Family Medicine

## 2022-11-02 NOTE — Progress Notes (Signed)
Remote ICD transmission.   

## 2022-11-02 NOTE — Progress Notes (Signed)
Cardiology Office Note:   Date:  11/10/2022  ID:  Kurt Reilly, DOB May 29, 1950, MRN [id number] PCP:  Jarold Motto, PA  [facility] Providers Cardiologist:  Alverda Skeans, MD Referring MD: Jarold Motto, [name]   Chief Complaint/Reason for Referral: Cardiology follow-up ASSESSMENT:    1. LV (left ventricular) mural thrombus   2. Coronary artery disease of native artery of native heart with stable angina pectoris (HCC)   3. Ischemic cardiomyopathy   4. Hyperlipidemia LDL goal <70   5. Essential hypertension   6. ICD (implantable cardioverter-defibrillator) in place   7. Aortic atherosclerosis (HCC)   8. CKD (chronic kidney disease) stage 2, GFR 60-89 ml/min     PLAN:   In order of problems listed above: 1.  LV thrombus: Most recent echocardiogram showed complete resolution.  The patient is now back on aspirin and Plavix.  We are planning for repeat echocardiogram with Definity and if the patient develops left ventricular thrombus again will likely need indefinite anticoagulation. 2.  Coronary artery disease: Continue aspirin, Plavix, and Praluent. 3.  Ischemic cardiomyopathy: Continue Entresto, spironolactone, beta-blocker, and Jardiance. 4.  Hyperlipidemia: LDL in February was 58.  On Praluent.  Check lipid panel in 2 weeks. 5.  Hypertension: Blood pressure is well-controlled today.  Due to fatigue at noon time with relatively lower blood pressures we will change Coreg 6.25 twice daily to Toprol 12.5 mg at bedtime.  He had previously taken Toprol 12.5 mg in the morning and could not tolerate this.  If the patient finds that this is helpful we will continue this therapy.  If not we will add a CMP, CBC, and reflex TSH to his lipid panel to be drawn in 2 weeks.  Visually we could try Coreg CR at bedtime at a very low dose as well. 6.  ICD: Followed by EP 7.  Aortic atherosclerosis: Continue aspirin, Praluent, and strict blood pressure control. 8.  CKD stage II: Continue Jardiance and  Entresto.             Dispo:  Return in about 6 months (around 05/10/2023).      Medication Adjustments/Labs and Tests Ordered: Current medicines are reviewed at length with the patient today.  Concerns regarding medicines are outlined above.  The following changes have been made:     Labs/tests ordered: Orders Placed This Encounter  Procedures   Lipid Profile    Medication Changes: Meds ordered this encounter  Medications   metoprolol succinate (TOPROL XL) 25 MG 24 hr tablet    Sig: Take 0.5 tablets (12.5 mg total) by mouth daily.    Dispense:  45 tablet    Refill:  3    Stop carvedilol    Current medicines are reviewed at length with the patient today.  The patient does not have concerns regarding medicines.  History of Present Illness:   FOCUSED PROBLEM LIST:   Coronary artery disease status post PCI of the LAD and bifurcation OM and circumflex remotely; AV Lcx and mid LAD now occluded; anatomy reviewed by Heart Team 2023 and consensus opinion was medical therapy should be pursued Ischemic cardiomyopathy EF ~35% status post subcutaneous ICD Hypertension Hyperlipidemia; intolerant of statins, Zetia, and Repatha; on Praluent CKD stage II LV apical aneurysm Aortic atherosclerosis on CT abd/pelvis 2022  March 2024:  The patient is a 72 y.o. male with the indicated medical history here for cardiology follow up.  He was seen in December in the cardiology clinic.  At that point in time he  was feeling well walking on a treadmill daily for about 45 minutes.  He endorses chest pain when he walks up an incline.   The patient is here today with his wife.  When he exerts himself in the yard such as raking leaves or uses the treadmill going up an incline he does develop angina.  This remits with rest.  He does not have any pain at rest.  He did go to [place] recently and when he was down there after the flight he did notice some ankle edema for a day or 2.  This has not recurred.  He  fortunately is not required any emergency room visits or hospitalizations.  He denies any presyncope or syncope.  He has had no severe bleeding or bruising.  He is otherwise well and tolerating his medications without significant issues.  Plan: Convert Coreg to Toprol-XL 12.5 mg daily, increase Jardiance to 25 mg, and obtain echocardiogram.  September 2024: The patient's Toprol XL could not be tolerated due to low blood pressures.  He was taking this in the morning.  He was converted back to Coreg 6.25 twice daily.  The patient had an echocardiogram in April which showed possible LV thrombus.  His aspirin and Plavix was stopped and Eliquis 5 mg twice daily was started.  A repeat echocardiogram in August demonstrated resolution of the thrombus.  His Eliquis was stopped and aspirin and Plavix restarted with a limited echocardiogram planned for 3 months with Definity to evaluate further.  His ICD was interrogated recently and was reassuring.  The patient is here with his wife today.  He is doing relatively well.  He is able to mow his grass without any issues with angina or dyspnea.  He has noticed some fatigue in the afternoon.  He did bring his blood pressure log in with him and his blood pressures around this time I usually relatively lower than his morning and evening blood pressures at up about 103 207 systolic.  He denies any peripheral edema, paroxysmal nocturnal dyspnea, ICD shocks, or any need for hospitalization or emergency room visit for cardiovascular issues.  He has been tolerating Praluent well.  He has not required any nitroglycerin.         Current Medications: Current Meds  Medication Sig   Alirocumab (PRALUENT) 150 MG/ML SOAJ Inject 1 mL into the skin every 14 (fourteen) days.   allopurinol (ZYLOPRIM) 300 MG tablet TAKE 1 TABLET BY MOUTH DAILY FOR GOUT   aspirin EC 81 MG tablet Take 1 tablet (81 mg total) by mouth daily. Swallow whole.   Cholecalciferol (VITAMIN D-3) 25 MCG (1000 UT)  CAPS Take by mouth daily. Per patient taking 1,000 units daily   clopidogrel (PLAVIX) 75 MG tablet Take 1 tablet (75 mg total) by mouth daily.   colchicine 0.6 MG tablet TAKE 2 TABS ORALLY AT FIRST SIGN OF FLARE & 1 TAB 1 HR LATER. THEN 1 TAB DAILY UNTIL FLARE RESOLVED   empagliflozin (JARDIANCE) 10 MG TABS tablet Take 1 tablet (10 mg total) by mouth daily before breakfast.   ferrous sulfate 325 (65 FE) MG tablet Take 325 mg by mouth daily with breakfast.   fluticasone (FLONASE) 50 MCG/ACT nasal spray Place 1 spray into both nostrils daily as needed for allergies or rhinitis.   metoprolol succinate (TOPROL XL) 25 MG 24 hr tablet Take 0.5 tablets (12.5 mg total) by mouth daily.   MILK THISTLE PO Take 500 mg by mouth daily.   Multiple Vitamins-Minerals (MULTIVITAMIN  WITH MINERALS) tablet Take 1 tablet by mouth daily.   nitroGLYCERIN (NITROSTAT) 0.4 MG SL tablet PLACE 1 TABLET UNDER THE TONGUE EVERY 5 MINUTES AS NEEDED FOR CHEST PAIN   oxyCODONE-acetaminophen (PERCOCET/ROXICET) 5-325 MG tablet Take 1 tablet by mouth every 6 (six) hours as needed for severe pain.   potassium chloride SA (KLOR-CON M20) 20 MEQ tablet TAKE 1 TABLET BY MOUTH ONLY WHEN YOU TAKE THE TORSEMIDE (Patient taking differently: Take 20 mEq by mouth daily as needed (water weight gain). TAKE 1 TABLET BY MOUTH ONLY WHEN YOU TAKE THE TORSEMIDE)   sacubitril-valsartan (ENTRESTO) 24-26 MG Take 1 tablet by mouth 2 (two) times daily.   spironolactone (ALDACTONE) 25 MG tablet Take 0.5 tablets (12.5 mg total) by mouth 3 (three) times a week. Take on Mondays, Wednesdays, and Fridays.   torsemide (DEMADEX) 20 MG tablet Take 20 mg by mouth daily as needed (water weight gain).   [DISCONTINUED] carvedilol (COREG) 6.25 MG tablet Take 1 tablet (6.25 mg total) by mouth 2 (two) times daily.     Allergies:    Ezetimibe, Repatha [evolocumab], Statins, and Penicillins   Social History:   Social History   Tobacco Use   Smoking status: Never    Smokeless tobacco: Never  Vaping Use   Vaping status: Never Used  Substance Use Topics   Alcohol use: Yes    Alcohol/week: 2.0 standard drinks of alcohol    Types: 2 Glasses of wine per week    Comment: 2 glasses of wine per week   Drug use: Never     Family Hx: Family History  Problem Relation Age of Onset   Hypertension Father    High Cholesterol Father    Stomach cancer Father    Bladder Cancer Brother    Diabetes Brother    Evelene Croon Parkinson White syndrome Son    Migraines Son    Prostate cancer Neg Hx    Breast cancer Neg Hx    Colon cancer Neg Hx    Pancreatic cancer Neg Hx      Review of Systems:   Please see the history of present illness.    All other systems reviewed and are negative.     EKGs/Labs/Other Test Reviewed:   EKG: EKG done November 2023 that I personally reviewed demonstrates sinus bradycardia and anterior T wave abnormality.  EKG Interpretation Date/Time:    Ventricular Rate:    PR Interval:    QRS Duration:    QT Interval:    QTC Calculation:   R Axis:      Text Interpretation:          Prior CV studies reviewed: Cardiac Studies & Procedures   CARDIAC CATHETERIZATION  CARDIAC CATHETERIZATION 01/18/2022  Narrative CONCLUSIONS: Separate LAD and circumflex ostia are noted. Ostial to mid LAD stent is patent except for the distal 10 mm where there is total occlusion.  Mid to apical LAD is filled by left to left and right to left collaterals. Ostial to distal circumflex stents ending in a two-stent bifurcation, demonstrating total occlusion of the distal margin of the circumflex in-stent.  Left to right collaterals noted from the patent obtuse marginal arising from the bifurcation stent. Nondominant right coronary supplying collaterals to the apical LAD. Severe left ventricular dysfunction, segmental involving mid anterior wall to apex and inferoapical segment with some regions of akinesis/dyskinesis.  EDP is 24 mmHg.  EF is 25 to  35%.  RECOMMENDATIONS:  Medical management for now. We will present at heart team  conference.  LV function likely excludes him from LAD and distal circumflex territory revascularization.  Total occlusion due to in-stent could be attempted but not likely to be durable. Continue antianginal and systolic heart failure therapy.  Add long-acting nitrate therapy. Present at heart team, 01/20/2022.  Findings Coronary Findings Diagnostic  Dominance: Right  Left Anterior Descending Collaterals Dist LAD filled by collaterals from 2nd Sept.  Collaterals Dist LAD filled by collaterals from Dist RCA.  Mid LAD lesion is 100% stenosed. The lesion was previously treated .  First Diagonal Branch Vessel is small in size.  Second Diagonal Branch 2nd Diag lesion is 70% stenosed.  Left Circumflex Prox Cx to Dist Cx lesion is 25% stenosed. The lesion was previously treated . Dist Cx lesion is 100% stenosed. The lesion was previously treated .  First Obtuse Marginal Branch 1st Mrg lesion is 30% stenosed. The lesion was previously treated .  Second Obtuse Marginal Branch Vessel is small in size.  Third Obtuse Marginal Branch Collaterals 3rd Mrg filled by collaterals from 1st Mrg.  Right Coronary Artery There is mild diffuse disease throughout the vessel.  Intervention  No interventions have been documented.   STRESS TESTS  MYOCARDIAL PERFUSION IMAGING 07/14/2019  Narrative  Nuclear stress EF: 25%. The left ventricular ejection fraction is severely decreased (<30%).  There was no ST segment deviation noted during stress.  Defect 1: There is a large defect of severe severity present in the basal inferoseptal, basal inferior, mid inferoseptal, mid inferior, apical anterior, apical septal, apical inferior, apical lateral and apex location.  Findings consistent with prior anterioapical and inferior wall myocardial infarction.  There is no evidence of ischemia    ECHOCARDIOGRAM  ECHOCARDIOGRAM COMPLETE 10/10/2022  Narrative ECHOCARDIOGRAM REPORT    Patient Name:   ILIAN GUIZAR    Date of Exam: 10/10/2022 Medical Rec #:  [id number]     Height:       65.5 in Accession #:    [id number]    Weight:       157.2 lb Date of Birth:  January 27, 1951      BSA:          1.796 m Patient Age:    72 years      BP:           131/82 mmHg Patient Gender: M             HR:           58 bpm. Exam Location:  [address]  Procedure: 2D Echo, Cardiac Doppler, Color Doppler and Intracardiac Opacification Agent  Indications:    I25.5 Ischemic Cardiomyopathy  History:        Patient has prior history of Echocardiogram examinations, most recent 06/30/2022. CHF, CAD, Arrythmias:Cardiac Arrest and Ventricular Fibrillation; Risk Factors:Hypertension.  Sonographer:    Clearence Ped RCS Referring Phys: [id number] Orbie Pyo  IMPRESSIONS   1. Definity contrast used. There is no left ventricular thrombus. There is an extensive area of anterapical scar (thin, hyperechoic LV myocardium, with no microbubble uptake). The apex is frankly dyskinetic. In addition there is hypokinesis of the inferior wall and inferior septum, but the mid basal segments of these walls do have some microbubble uptake. Left ventricular ejection fraction, by estimation, is 30 to 35%. The left ventricle has moderately decreased function. The left ventricle demonstrates regional wall motion abnormalities (see scoring diagram/findings for description). Left ventricular diastolic parameters are consistent with Grade I diastolic dysfunction (impaired relaxation). 2. Right ventricular systolic function  is normal. The right ventricular size is normal. Tricuspid regurgitation signal is inadequate for assessing PA pressure. 3. Left atrial size was mild to moderately dilated. 4. The mitral valve is normal in structure. Trivial mitral valve regurgitation. 5. The aortic valve is tricuspid. There is mild calcification  of the aortic valve. Aortic valve regurgitation is not visualized. Aortic valve sclerosis/calcification is present, without any evidence of aortic stenosis.  Comparison(s): No significant change from prior study. Prior images reviewed side by side.  FINDINGS Left Ventricle: Definity contrast used. There is no left ventricular thrombus. There is an extensive area of anterapical scar (thin, hyperechoic LV myocardium, with no microbubble uptake). The apex is frankly dyskinetic. In addition there is hypokinesis of the inferior wall and inferior septum, but the mid basal segments of these walls do have some microbubble uptake. Left ventricular ejection fraction, by estimation, is 30 to 35%. The left ventricle has moderately decreased function. The left ventricle demonstrates regional wall motion abnormalities. The left ventricular internal cavity size was normal in size. There is no left ventricular hypertrophy. Left ventricular diastolic parameters are consistent with Grade I diastolic dysfunction (impaired relaxation). Normal left ventricular filling pressure.   LV Wall Scoring: The apical lateral segment, apical anterior segment, apical inferior segment, and apex are dyskinetic. The mid and distal anterior septum is akinetic. The inferior wall, mid inferoseptal segment, and basal inferoseptal segment are hypokinetic. The anterior wall, antero-lateral wall, posterior wall, and basal anteroseptal segment are normal.  Right Ventricle: The right ventricular size is normal. No increase in right ventricular wall thickness. Right ventricular systolic function is normal. Tricuspid regurgitation signal is inadequate for assessing PA pressure.  Left Atrium: Left atrial size was mild to moderately dilated.  Right Atrium: Right atrial size was normal in size.  Pericardium: There is no evidence of pericardial effusion.  Mitral Valve: The mitral valve is normal in structure. Trivial mitral valve  regurgitation.  Tricuspid Valve: The tricuspid valve is normal in structure. Tricuspid valve regurgitation is trivial.  Aortic Valve: The aortic valve is tricuspid. There is mild calcification of the aortic valve. Aortic valve regurgitation is not visualized. Aortic valve sclerosis/calcification is present, without any evidence of aortic stenosis.  Pulmonic Valve: The pulmonic valve was normal in structure. Pulmonic valve regurgitation is not visualized. No evidence of pulmonic stenosis.  Aorta: The aortic root and ascending aorta are structurally normal, with no evidence of dilitation.  IAS/Shunts: No atrial level shunt detected by color flow Doppler.   LEFT VENTRICLE PLAX 2D LVIDd:         4.30 cm   Diastology LVIDs:         3.40 cm   LV e' medial:    5.55 cm/s LV PW:         1.20 cm   LV E/e' medial:  11.7 LV IVS:        0.90 cm   LV e' lateral:   13.60 cm/s LVOT diam:     2.10 cm   LV E/e' lateral: 4.8 LV SV:         68 LV SV Index:   38 LVOT Area:     3.46 cm   RIGHT VENTRICLE RV Basal diam:  3.00 cm RV S prime:     14.30 cm/s TAPSE (M-mode): 2.4 cm  LEFT ATRIUM             Index        RIGHT ATRIUM  Index LA diam:        4.40 cm 2.45 cm/m   RA Pressure: 3.00 mmHg LA Vol (A2C):   52.0 ml 28.95 ml/m  RA Area:     9.84 cm LA Vol (A4C):   47.3 ml 26.33 ml/m  RA Volume:   19.30 ml  10.75 ml/m LA Biplane Vol: 50.5 ml 28.12 ml/m AORTIC VALVE LVOT Vmax:   87.80 cm/s LVOT Vmean:  64.300 cm/s LVOT VTI:    0.196 m  AORTA Ao Root diam: 3.10 cm Ao Asc diam:  3.10 cm  MITRAL VALVE               TRICUSPID VALVE MV Area (PHT):             Estimated RAP:  3.00 mmHg MV Decel Time: MV E velocity: 65.00 cm/s  SHUNTS MV A velocity: 76.00 cm/s  Systemic VTI:  0.20 m MV E/A ratio:  0.86        Systemic Diam: 2.10 cm  Rachelle Hora Croitoru MD Electronically signed by Thurmon Fair MD Signature Date/Time: 10/10/2022/4:57:23 PM    Final             Recent  Labs: 01/16/2022: Hemoglobin 15.1; Platelets 292 06/01/2022: ALT 14; BUN 24; Creatinine, Ser 1.08; Potassium 4.1; Sodium 138   Lipid Panel    Component Value Date/Time   CHOL 136 04/19/2022 0748   TRIG 224 (H) 04/19/2022 0748   HDL 42 04/19/2022 0748   CHOLHDL 3.2 04/19/2022 0748   CHOLHDL 3 05/23/2021 0901   VLDL 54.4 (H) 05/23/2021 0901   LDLCALC 58 04/19/2022 0748   LDLDIRECT 52.0 05/23/2021 0901    Risk Assessment/Calculations:          Physical Exam:   VS:  BP 112/64   Pulse (!) 58   Ht 5\' 4"  (1.626 m)   Wt 156 lb 6.4 oz (70.9 kg)   SpO2 100%   BMI 26.85 kg/m       Wt Readings from Last 3 Encounters:  11/10/22 156 lb 6.4 oz (70.9 kg)  09/20/22 157 lb 4 oz (71.3 kg)  09/15/22 154 lb (69.9 kg)      GENERAL:  No apparent distress, AOx3 HEENT:  No carotid bruits, +2 carotid impulses, no scleral icterus CAR: RRR no murmurs, gallops, rubs, or thrills RES:  Clear to auscultation bilaterally ABD:  Soft, nontender, nondistended, positive bowel sounds x 4 VASC:  +2 radial pulses, +2 carotid pulses NEURO:  CN 2-12 grossly intact; motor and sensory grossly intact PSYCH:  No active depression or anxiety EXT:  No edema, ecchymosis, or cyanosis  Signed, Orbie Pyo, MD  11/10/2022 10:26 AM    [facility] [address] [place], [place], [place]  [address] Phone: [phone]; Fax: [phone]   Note:  This document was prepared using Dragon voice recognition software and may include unintentional dictation errors.

## 2022-11-10 ENCOUNTER — Encounter: Payer: Self-pay | Admitting: Internal Medicine

## 2022-11-10 ENCOUNTER — Ambulatory Visit: Payer: Medicare Other | Attending: Internal Medicine | Admitting: Internal Medicine

## 2022-11-10 VITALS — BP 112/64 | HR 58 | Ht 64.0 in | Wt 156.4 lb

## 2022-11-10 DIAGNOSIS — I513 Intracardiac thrombosis, not elsewhere classified: Secondary | ICD-10-CM | POA: Diagnosis present

## 2022-11-10 DIAGNOSIS — N182 Chronic kidney disease, stage 2 (mild): Secondary | ICD-10-CM | POA: Insufficient documentation

## 2022-11-10 DIAGNOSIS — I10 Essential (primary) hypertension: Secondary | ICD-10-CM | POA: Insufficient documentation

## 2022-11-10 DIAGNOSIS — I255 Ischemic cardiomyopathy: Secondary | ICD-10-CM | POA: Diagnosis present

## 2022-11-10 DIAGNOSIS — E785 Hyperlipidemia, unspecified: Secondary | ICD-10-CM | POA: Insufficient documentation

## 2022-11-10 DIAGNOSIS — I25118 Atherosclerotic heart disease of native coronary artery with other forms of angina pectoris: Secondary | ICD-10-CM | POA: Insufficient documentation

## 2022-11-10 DIAGNOSIS — Z95810 Presence of automatic (implantable) cardiac defibrillator: Secondary | ICD-10-CM | POA: Diagnosis present

## 2022-11-10 DIAGNOSIS — I700 Atherosclerosis of aorta: Secondary | ICD-10-CM | POA: Diagnosis present

## 2022-11-10 MED ORDER — METOPROLOL SUCCINATE ER 25 MG PO TB24: 12.5000 mg | ORAL_TABLET | Freq: Every day | ORAL | 3 refills | Status: DC

## 2022-11-10 NOTE — Patient Instructions (Signed)
Medication Instructions:  Your physician has recommended you make the following change in your medication:  1.) stop carvedilol 2.) start metoprolol succinate (Toprol XL) 25 mg - take HALF TABLET daily at bedtime  *If you need a refill on your cardiac medications before your next appointment, please call your pharmacy*   Lab Work: Please return in 2 weeks for blood work (lipids) If you have labs (blood work) drawn today and your tests are completely normal, you will receive your results only by: MyChart Message (if you have MyChart) OR A paper copy in the mail If you have any lab test that is abnormal or we need to change your treatment, we will call you to review the results.   Testing/Procedures: none   Follow-Up: At [facility], you and your health needs are our priority.  As part of our continuing mission to provide you with exceptional heart care, we have created designated Provider Care Teams.  These Care Teams include your primary Cardiologist (physician) and Advanced Practice Providers (APPs -  Physician Assistants and Nurse Practitioners) who all work together to provide you with the care you need, when you need it.   Your next appointment:   6 month(s)  Provider:   Orbie Pyo, MD

## 2022-11-14 ENCOUNTER — Other Ambulatory Visit: Payer: Self-pay | Admitting: *Deleted

## 2022-11-14 MED ORDER — EMPAGLIFLOZIN 10 MG PO TABS: 10.0000 mg | ORAL_TABLET | Freq: Every day | ORAL | 3 refills | Status: DC

## 2022-11-24 ENCOUNTER — Ambulatory Visit: Payer: Medicare Other | Attending: Internal Medicine

## 2022-11-24 DIAGNOSIS — I25118 Atherosclerotic heart disease of native coronary artery with other forms of angina pectoris: Secondary | ICD-10-CM

## 2022-11-24 DIAGNOSIS — I255 Ischemic cardiomyopathy: Secondary | ICD-10-CM

## 2022-11-24 DIAGNOSIS — E785 Hyperlipidemia, unspecified: Secondary | ICD-10-CM

## 2022-11-25 LAB — LIPID PANEL
Chol/HDL Ratio: 3.4 ratio (ref 0.0–5.0)
Cholesterol, Total: 134 mg/dL (ref 100–199)
HDL: 39 mg/dL — ABNORMAL LOW (ref >39)
LDL Chol Calc (NIH): 62 mg/dL (ref 0–99)
Triglycerides: 197 mg/dL — ABNORMAL HIGH (ref 0–149)
VLDL Cholesterol Cal: 33 mg/dL (ref 5–40)

## 2022-12-08 ENCOUNTER — Telehealth: Payer: Self-pay | Admitting: Internal Medicine

## 2022-12-08 MED ORDER — POTASSIUM CHLORIDE CRYS ER 20 MEQ PO TBCR: EXTENDED_RELEASE_TABLET | ORAL | 3 refills | Status: DC

## 2022-12-08 MED ORDER — NITROGLYCERIN 0.4 MG SL SUBL: 0.4000 mg | SUBLINGUAL_TABLET | SUBLINGUAL | 11 refills | Status: DC | PRN

## 2022-12-08 MED ORDER — TORSEMIDE 20 MG PO TABS: 20.0000 mg | ORAL_TABLET | Freq: Every day | ORAL | 3 refills | Status: DC | PRN

## 2022-12-08 NOTE — Telephone Encounter (Signed)
*  STAT* If patient is at the pharmacy, call can be transferred to refill team.   1. Which medications need to be refilled? (please list name of each medication and dose if known)   nitroGLYCERIN (NITROSTAT) 0.4 MG SL tablet   2. Would you like to learn more about the convenience, safety, & potential cost savings by using the [facility]?   3. Are you open to using the [facility] (Type [facility].    4. Which pharmacy/location (including street and city if local pharmacy) is medication to be sent to?  [id number] - [place], [place] - [address] AT [address] OF [address]   5. Do they need a 30 day or 90 day supply?   Patient stated he still has medication left.

## 2022-12-08 NOTE — Telephone Encounter (Signed)
Returned call to patient.  He was throwing out his expired bottle of isosorbide and thought he needed a new prescription of this to have on hand as needed for chest pain.  It was discontinued 02/2022 because he was not taking.  We discussed that is what the SL ntg is for and he has a current prescription of that.  No other needs at this time.

## 2022-12-08 NOTE — Telephone Encounter (Signed)
Pt c/o medication issue:  1. Name of Medication: isosorbide mononitrate (IMDUR) 30 MG 24 hr tablet   2. How are you currently taking this medication (dosage and times per day)?  Take 0.5 tablets (15 mg total) by mouth daily.   3. Are you having a reaction (difficulty breathing--STAT)? No  4. What is your medication issue? Pt is requesting a callback regarding this medication being refilled but it was discontinued. Please advise

## 2022-12-08 NOTE — Telephone Encounter (Signed)
Pt's medication was sent to pt's pharmacy as requested. Confirmation received.  °

## 2022-12-08 NOTE — Telephone Encounter (Signed)
Pt's medications were sent to pt's pharmacy as requested. Confirmation received.  

## 2022-12-08 NOTE — Telephone Encounter (Signed)
*  STAT* If patient is at the pharmacy, call can be transferred to refill team.   1. Which medications need to be refilled? (please list name of each medication and dose if known)    potassium chloride SA (KLOR-CON M20) 20 MEQ tablet  torsemide (DEMADEX) 20 MG tablet   2. Which pharmacy/location (including street and city if local pharmacy) is medication to be sent to?  [facility] - [place], [place] - [address]      3. Do they need a 30 day or 90 day supply? 90 day

## 2022-12-11 ENCOUNTER — Other Ambulatory Visit: Payer: Self-pay

## 2022-12-11 MED ORDER — METOPROLOL SUCCINATE ER 25 MG PO TB24: 12.5000 mg | ORAL_TABLET | Freq: Every day | ORAL | 3 refills | Status: DC

## 2023-01-11 ENCOUNTER — Ambulatory Visit (HOSPITAL_COMMUNITY): Payer: Medicare Other | Attending: Internal Medicine

## 2023-01-11 DIAGNOSIS — I513 Intracardiac thrombosis, not elsewhere classified: Secondary | ICD-10-CM | POA: Diagnosis not present

## 2023-01-11 LAB — ECHOCARDIOGRAM LIMITED
Area-P 1/2: 3.87 cm2
S' Lateral: 3.9 cm

## 2023-01-11 MED ORDER — PERFLUTREN LIPID MICROSPHERE
3.0000 mL | INTRAVENOUS | Status: AC | PRN
Start: 2023-01-11 — End: 2023-01-11
  Administered 2023-01-11: 3 mL via INTRAVENOUS

## 2023-01-16 ENCOUNTER — Other Ambulatory Visit: Payer: Self-pay

## 2023-01-16 MED ORDER — SPIRONOLACTONE 25 MG PO TABS: 12.5000 mg | ORAL_TABLET | ORAL | 3 refills | Status: AC

## 2023-01-22 ENCOUNTER — Ambulatory Visit (INDEPENDENT_AMBULATORY_CARE_PROVIDER_SITE_OTHER): Payer: Medicare Other

## 2023-01-22 DIAGNOSIS — I255 Ischemic cardiomyopathy: Secondary | ICD-10-CM | POA: Diagnosis not present

## 2023-01-22 DIAGNOSIS — I5022 Chronic systolic (congestive) heart failure: Secondary | ICD-10-CM

## 2023-01-25 LAB — CUP PACEART REMOTE DEVICE CHECK
Battery Remaining Percentage: 71 %
Date Time Interrogation Session: 20241120164600
HighPow Impedance: 90 Ohm
Implantable Lead Connection Status: 753985
Implantable Lead Implant Date: 20170727
Implantable Lead Location: 753862
Implantable Lead Model: 3401
Implantable Pulse Generator Implant Date: 20220404
Pulse Gen Serial Number: 156707

## 2023-02-15 NOTE — Progress Notes (Signed)
Remote ICD transmission.   

## 2023-02-15 NOTE — Addendum Note (Signed)
Addended by: Geralyn Flash D on: 02/15/2023 10:38 AM   Modules accepted: Orders

## 2023-03-12 ENCOUNTER — Encounter: Payer: Self-pay | Admitting: Physician Assistant

## 2023-04-06 ENCOUNTER — Ambulatory Visit (INDEPENDENT_AMBULATORY_CARE_PROVIDER_SITE_OTHER): Payer: Medicare Other | Admitting: *Deleted

## 2023-04-06 DIAGNOSIS — Z23 Encounter for immunization: Secondary | ICD-10-CM

## 2023-04-17 ENCOUNTER — Other Ambulatory Visit: Payer: Self-pay

## 2023-04-17 MED ORDER — CLOPIDOGREL BISULFATE 75 MG PO TABS: 75.0000 mg | ORAL_TABLET | Freq: Every day | ORAL | 2 refills | Status: DC

## 2023-04-20 ENCOUNTER — Other Ambulatory Visit: Payer: Self-pay | Admitting: Internal Medicine

## 2023-04-23 ENCOUNTER — Ambulatory Visit (INDEPENDENT_AMBULATORY_CARE_PROVIDER_SITE_OTHER): Payer: Medicare Other

## 2023-04-23 DIAGNOSIS — I255 Ischemic cardiomyopathy: Secondary | ICD-10-CM | POA: Diagnosis not present

## 2023-04-23 DIAGNOSIS — I5022 Chronic systolic (congestive) heart failure: Secondary | ICD-10-CM

## 2023-04-23 LAB — CUP PACEART REMOTE DEVICE CHECK
Battery Remaining Percentage: 69 %
Date Time Interrogation Session: 20250217085000
HighPow Impedance: 80 Ohm
Implantable Lead Connection Status: 753985
Implantable Lead Implant Date: 20170727
Implantable Lead Location: 753862
Implantable Lead Model: 3401
Implantable Pulse Generator Implant Date: 20220404
Pulse Gen Serial Number: 156707

## 2023-04-24 ENCOUNTER — Encounter: Payer: Self-pay | Admitting: Internal Medicine

## 2023-05-03 NOTE — Progress Notes (Signed)
 Cardiology Office Note:   Date:  05/10/2023  ID:  Kurt Reilly, DOB 03/17/1950, MRN [id number] PCP:  Jarold Motto, PA  [facility] Providers Cardiologist:  Alverda Skeans, MD Referring MD: Jarold Motto, [name]  Chief Complaint/Reason for Referral: Follow-up for coronary artery disease, ischemic cardiomyopathy ASSESSMENT:    1. Coronary artery disease of native artery of native heart with stable angina pectoris (HCC)   2. Chronic combined systolic and diastolic heart failure (HCC)   3. LV (left ventricular) mural thrombus   4. Hyperlipidemia LDL goal <70   5. Aortic atherosclerosis (HCC)   6. Essential hypertension   7. CKD (chronic kidney disease) stage 2, GFR 60-89 ml/min     PLAN:   In order of problems listed above: Coronary artery disease disease: Continue aspirin 81 mg, Plavix 75 mg, Praluent, and Toprol XL given cardiomyopathy.  Continue as needed nitroglycerin.  I have asked him to take as needed nitroglycerin prior to exercising to see if this helps his stable anginal symptoms. Combined systolic and diastolic heart failure: Continue Jardiance 10 mg daily, Toprol 25 mg daily, Entresto 24 x 26 mg daily, spironolactone 12.5 mg daily, and torsemide 20 mg as needed.  He has had some issues with low blood pressures and fatigue in the morning.  I have asked him to take his spironolactone at bedtime LV thrombus: Last 2 echocardiograms demonstrate no LV thrombus while on dual antiplatelet therapy.  Continue current therapy.  Will obtain Definity enhanced echocardiogram to evaluate for LV thrombus. Hyperlipidemia: Check lipid panel and LFTs today. Hypertension: Blood pressure is well-controlled today. CKD stage II: Continue Jardiance 10 mg and Entresto 24 x 26 mg for renal protection.            Dispo:  Return in about 6 months (around 11/10/2023).      Medication Adjustments/Labs and Tests Ordered: Current medicines are reviewed at length with the patient today.  Concerns  regarding medicines are outlined above.  The following changes have been made:  no change   Labs/tests ordered: Orders Placed This Encounter  Procedures   Hepatic function panel   Lipid panel   Lipoprotein A (LPA)   EKG 12-Lead   ECHOCARDIOGRAM COMPLETE    Medication Changes: No orders of the defined types were placed in this encounter.   Current medicines are reviewed at length with the patient today.  The patient does not have concerns regarding medicines.  I spent 39 minutes reviewing all clinical data during and prior to this visit including all relevant imaging studies, laboratories, clinical information from other health systems and prior notes from both Cardiology and other specialties, interviewing the patient, conducting a complete physical examination, and coordinating care in order to formulate a comprehensive and personalized evaluation and treatment plan.   History of Present Illness:      FOCUSED PROBLEM LIST:   CAD PCI of LAD and bifurcation OM left circumflex remotely LAD CTO, left circumflex CTO 2023 Heart team meeting 2023 >>> medical therapy ICM status post ICD EF 30 to 35%, severe anterior akinesis moderate MR grade 2 diastolic dysfunction TTE 01/2023 LV thrombus LV thrombus TTE April 2024 >> Eliquis initiated Shared decision making >> trial off anticoagulation No LV thrombus TTE August 2024 while off anticoagulation No LV thrombus TTE November 2024 off anticoagulation Hyperlipidemia Aortic atherosclerosis Hypertension CKD stage II  March 2024:  The patient is a 73 y.o. male with the indicated medical history here for cardiology follow up.  He was seen in December  in the cardiology clinic.  At that point in time he was feeling well walking on a treadmill daily for about 45 minutes.  He endorses chest pain when he walks up an incline.   The patient is here today with his wife.  When he exerts himself in the yard such as raking leaves or uses the  treadmill going up an incline he does develop angina.  This remits with rest.  He does not have any pain at rest.  He did go to [place] recently and when he was down there after the flight he did notice some ankle edema for a day or 2.  This has not recurred.  He fortunately is not required any emergency room visits or hospitalizations.  He denies any presyncope or syncope.  He has had no severe bleeding or bruising.  He is otherwise well and tolerating his medications without significant issues.  Plan: Convert Coreg to Toprol-XL 12.5 mg daily, increase Jardiance to 25 mg, and obtain echocardiogram.   September 2024: The patient's Toprol XL could not be tolerated due to low blood pressures.  He was taking this in the morning.  He was converted back to Coreg 6.25 twice daily.  The patient had an echocardiogram in April which showed possible LV thrombus.  His aspirin and Plavix was stopped and Eliquis 5 mg twice daily was started.  A repeat echocardiogram in August demonstrated resolution of the thrombus.  His Eliquis was stopped and aspirin and Plavix restarted with a limited echocardiogram planned for 3 months with Definity to evaluate further.  His ICD was interrogated recently and was reassuring.   The patient is here with his wife today.  He is doing relatively well.  He is able to mow his grass without any issues with angina or dyspnea.  He has noticed some fatigue in the afternoon.  He did bring his blood pressure log in with him and his blood pressures around this time I usually relatively lower than his morning and evening blood pressures at up about 103 207 systolic.  He denies any peripheral edema, paroxysmal nocturnal dyspnea, ICD shocks, or any need for hospitalization or emergency room visit for cardiovascular issues.  He has been tolerating Praluent well.  He has not required any nitroglycerin.  Plan: Repeat echocardiogram with Definity while on dual antiplatelet therapy, check lipid panel, change  Coreg 6.25 mg twice daily to Toprol 12.5 mg at bedtime.  March 2024: Patient consents to use of AI scribe.  In the interim the patient's LDL was 62.  Repeat echocardiogram in November with Definity demonstrated no LV thrombus.  He experiences chest pain during exertion, particularly when using the treadmill. The pain is inducible by exertion, which he attributes to his blocked artery on the front wall of the heart. He has not used nitroglycerin before exercising. No chest pain at rest and no shortness of breath.  He is currently on aspirin and Plavix without significant bleeding issues, such as epistaxis or new bruises. He takes metoprolol at 6:30 AM and spironolactone three times a week in the morning. He experiences occasional lightheadedness, especially when changing positions quickly, which he attributes to his medications. He is not on statins but takes Praluent for cholesterol management. He also takes a B vitamin supplement but is unsure about its necessity.  His blood pressure at home averages 114/55, but he feels low energy in the afternoons. Occasional lightheadedness is noted, but there are no blacking out spells or swelling in the legs.  He is on Jardiance, which causes frequent urination, and he finds it bothersome. He takes it in the morning and reports needing to use the bathroom frequently, including at night.  He experiences muscle pain without cramping and has been advised by a chiropractor to take magnesium.  He is planning a trip to [place] and [place] next year and is concerned about the possibility of blood clots during travel. He has had two echocardiograms in the past that showed no clots, which he finds reassuring          Current Medications: Current Meds  Medication Sig   Alirocumab (PRALUENT) 150 MG/ML SOAJ Inject 1 mL into the skin every 14 (fourteen) days.   allopurinol (ZYLOPRIM) 300 MG tablet TAKE 1 TABLET BY MOUTH DAILY FOR GOUT   aspirin EC 81 MG tablet Take 1  tablet (81 mg total) by mouth daily. Swallow whole.   B Complex-C (VITAMIN B + C COMPLEX PO)    Cholecalciferol (VITAMIN D-3) 25 MCG (1000 UT) CAPS Take by mouth daily. Per patient taking 1,000 units daily   clopidogrel (PLAVIX) 75 MG tablet Take 1 tablet (75 mg total) by mouth daily.   colchicine 0.6 MG tablet TAKE 2 TABS ORALLY AT FIRST SIGN OF FLARE & 1 TAB 1 HR LATER. THEN 1 TAB DAILY UNTIL FLARE RESOLVED   empagliflozin (JARDIANCE) 10 MG TABS tablet Take 1 tablet (10 mg total) by mouth daily before breakfast.   ferrous sulfate 325 (65 FE) MG tablet Take 325 mg by mouth daily with breakfast.   fluticasone (FLONASE) 50 MCG/ACT nasal spray Place 1 spray into both nostrils daily as needed for allergies or rhinitis.   metoprolol succinate (TOPROL XL) 25 MG 24 hr tablet Take 0.5 tablets (12.5 mg total) by mouth daily.   MILK THISTLE PO Take 500 mg by mouth daily.   nitroGLYCERIN (NITROSTAT) 0.4 MG SL tablet Place 1 tablet (0.4 mg total) under the tongue every 5 (five) minutes as needed for chest pain.   potassium chloride SA (KLOR-CON M20) 20 MEQ tablet TAKE 1 TABLET BY MOUTH ONLY WHEN YOU TAKE THE TORSEMIDE   sacubitril-valsartan (ENTRESTO) 24-26 MG TAKE 1 TABLET TWICE A DAY   spironolactone (ALDACTONE) 25 MG tablet Take 0.5 tablets (12.5 mg total) by mouth 3 (three) times a week. Take on Mondays, Wednesdays, and Fridays.   torsemide (DEMADEX) 20 MG tablet Take 1 tablet (20 mg total) by mouth daily as needed (water weight gain).     Review of Systems:   Please see the history of present illness.    All other systems reviewed and are negative.     EKGs/Labs/Other Test Reviewed:   EKG: EKG from November 2023 demonstrates sinus bradycardia with anterolateral T wave abnormality  EKG Interpretation Date/Time:  Thursday May 10 2023 10:57:45 EST Ventricular Rate:  58 PR Interval:  168 QRS Duration:  100 QT Interval:  400 QTC Calculation: 392 R Axis:   16  Text Interpretation: Sinus  bradycardia T wave abnormality, consider anterolateral ischemia When compared with ECG of 03-Jan-2021 11:37, No significant change was found Confirmed by Alverda Skeans ([id number]) on 05/10/2023 11:08:38 AM         Risk Assessment/Calculations:          Physical Exam:   VS:  BP 120/70   Pulse (!) 57   Ht 5\' 5"  (1.651 m)   Wt 156 lb (70.8 kg)   SpO2 96%   BMI 25.96 kg/m  Wt Readings from Last 3 Encounters:  05/10/23 156 lb (70.8 kg)  11/10/22 156 lb 6.4 oz (70.9 kg)  09/20/22 157 lb 4 oz (71.3 kg)      GENERAL:  No apparent distress, AOx3 HEENT:  No carotid bruits, +2 carotid impulses, no scleral icterus CAR: RRR no murmurs, gallops, rubs, or thrills RES:  Clear to auscultation bilaterally ABD:  Soft, nontender, nondistended, positive bowel sounds x 4 VASC:  +2 radial pulses, +2 carotid pulses NEURO:  CN 2-12 grossly intact; motor and sensory grossly intact PSYCH:  No active depression or anxiety EXT:  No edema, ecchymosis, or cyanosis  Signed, Orbie Pyo, MD  05/10/2023 11:33 AM    [facility] [address] [place], [place], [place]  [address] Phone: [phone]; Fax: [phone]   Note:  This document was prepared using Dragon voice recognition software and may include unintentional dictation errors.

## 2023-05-07 ENCOUNTER — Encounter: Payer: Self-pay | Admitting: Physician Assistant

## 2023-05-10 ENCOUNTER — Encounter: Payer: Self-pay | Admitting: Internal Medicine

## 2023-05-10 ENCOUNTER — Ambulatory Visit: Payer: Medicare Other | Attending: Internal Medicine | Admitting: Internal Medicine

## 2023-05-10 VITALS — BP 120/70 | HR 57 | Ht 65.0 in | Wt 156.0 lb

## 2023-05-10 DIAGNOSIS — E785 Hyperlipidemia, unspecified: Secondary | ICD-10-CM | POA: Diagnosis present

## 2023-05-10 DIAGNOSIS — I25118 Atherosclerotic heart disease of native coronary artery with other forms of angina pectoris: Secondary | ICD-10-CM | POA: Diagnosis present

## 2023-05-10 DIAGNOSIS — I700 Atherosclerosis of aorta: Secondary | ICD-10-CM | POA: Insufficient documentation

## 2023-05-10 DIAGNOSIS — I10 Essential (primary) hypertension: Secondary | ICD-10-CM | POA: Insufficient documentation

## 2023-05-10 DIAGNOSIS — I513 Intracardiac thrombosis, not elsewhere classified: Secondary | ICD-10-CM | POA: Diagnosis present

## 2023-05-10 DIAGNOSIS — N182 Chronic kidney disease, stage 2 (mild): Secondary | ICD-10-CM | POA: Diagnosis present

## 2023-05-10 DIAGNOSIS — I5042 Chronic combined systolic (congestive) and diastolic (congestive) heart failure: Secondary | ICD-10-CM | POA: Insufficient documentation

## 2023-05-10 NOTE — Patient Instructions (Signed)
 Medication Instructions:  Your physician recommends that you continue on your current medications as directed. Please refer to the Current Medication list given to you today.  *If you need a refill on your cardiac medications before your next appointment, please call your pharmacy*  Lab Work: TODAY: Lipid panel, LFTs, LP(a) If you have labs (blood work) drawn today and your tests are completely normal, you will receive your results only by: MyChart Message (if you have MyChart) OR A paper copy in the mail If you have any lab test that is abnormal or we need to change your treatment, we will call you to review the results.  Testing/Procedures: Your physician has requested that you have an echocardiogram. Echocardiography is a painless test that uses sound waves to create images of your heart. It provides your doctor with information about the size and shape of your heart and how well your heart's chambers and valves are working. This procedure takes approximately one hour. There are no restrictions for this procedure. Please do NOT wear cologne, perfume, aftershave, or lotions (deodorant is allowed). Please arrive 15 minutes prior to your appointment time.  Please note: We ask at that you not bring children with you during ultrasound (echo/ vascular) testing. Due to room size and safety concerns, children are not allowed in the ultrasound rooms during exams. Our front office staff cannot provide observation of children in our lobby area while testing is being conducted. An adult accompanying a patient to their appointment will only be allowed in the ultrasound room at the discretion of the ultrasound technician under special circumstances. We apologize for any inconvenience.  Follow-Up: At [facility], you and your health needs are our priority.  As part of our continuing mission to provide you with exceptional heart care, we have created designated Provider Care Teams.  These Care Teams  include your primary Cardiologist (physician) and Advanced Practice Providers (APPs -  Physician Assistants and Nurse Practitioners) who all work together to provide you with the care you need, when you need it.  Your next appointment:   6 month(s)  The format for your next appointment:   In Person  Provider:   Orbie Pyo, MD {  Other Instructions   1st Floor: - Lobby - Registration  - Pharmacy  - Lab - Cafe  2nd Floor: - PV Lab - Diagnostic Testing (echo, CT, nuclear med)  3rd Floor: - Vacant  4th Floor: - TCTS (cardiothoracic surgery) - AFib Clinic - Structural Heart Clinic - Vascular Surgery  - Vascular Ultrasound  5th Floor: - HeartCare Cardiology (general and EP) - Clinical Pharmacy for coumadin, hypertension, lipid, weight-loss medications, and med management appointments    Valet parking services will be available as well.

## 2023-05-11 LAB — HEPATIC FUNCTION PANEL
ALT: 19 IU/L (ref 0–44)
AST: 18 IU/L (ref 0–40)
Albumin: 4.7 g/dL (ref 3.9–4.9)
Alkaline Phosphatase: 111 IU/L (ref 44–121)
Bilirubin Total: 0.4 mg/dL (ref 0.0–1.2)
Bilirubin, Direct: 0.13 mg/dL (ref 0.00–0.40)
Total Protein: 7.3 g/dL (ref 6.0–8.5)

## 2023-05-11 LAB — LIPOPROTEIN A (LPA): Lipoprotein (a): 52.8 nmol/L (ref <75.0)

## 2023-05-11 LAB — LIPID PANEL
Chol/HDL Ratio: 3.3 ratio (ref 0.0–5.0)
Cholesterol, Total: 133 mg/dL (ref 100–199)
HDL: 40 mg/dL (ref >39)
LDL Chol Calc (NIH): 60 mg/dL (ref 0–99)
Triglycerides: 204 mg/dL — ABNORMAL HIGH (ref 0–149)
VLDL Cholesterol Cal: 33 mg/dL (ref 5–40)

## 2023-05-12 ENCOUNTER — Encounter: Payer: Self-pay | Admitting: Internal Medicine

## 2023-05-30 NOTE — Progress Notes (Signed)
 Remote ICD transmission.

## 2023-06-04 ENCOUNTER — Ambulatory Visit (HOSPITAL_COMMUNITY): Attending: Cardiology

## 2023-06-04 ENCOUNTER — Encounter: Payer: Self-pay | Admitting: Internal Medicine

## 2023-06-04 DIAGNOSIS — I513 Intracardiac thrombosis, not elsewhere classified: Secondary | ICD-10-CM | POA: Insufficient documentation

## 2023-06-04 LAB — ECHOCARDIOGRAM COMPLETE
Area-P 1/2: 3.27 cm2
Calc EF: 32.2 %
MV M vel: 3.97 m/s
MV Peak grad: 63 mmHg
S' Lateral: 4.2 cm
Single Plane A2C EF: 39.3 %
Single Plane A4C EF: 26.1 %

## 2023-06-04 MED ORDER — PERFLUTREN LIPID MICROSPHERE
1.0000 mL | INTRAVENOUS | Status: AC | PRN
  Administered 2023-06-04: 2 mL via INTRAVENOUS

## 2023-06-14 ENCOUNTER — Ambulatory Visit (INDEPENDENT_AMBULATORY_CARE_PROVIDER_SITE_OTHER): Payer: Medicare Other

## 2023-06-14 VITALS — BP 110/68 | HR 61 | Temp 98.1°F | Ht 66.0 in | Wt 159.4 lb

## 2023-06-14 DIAGNOSIS — Z0000 Encounter for general adult medical examination without abnormal findings: Secondary | ICD-10-CM | POA: Diagnosis not present

## 2023-06-14 DIAGNOSIS — Z139 Encounter for screening, unspecified: Secondary | ICD-10-CM

## 2023-06-14 NOTE — Progress Notes (Addendum)
 Subjective:   Kurt Reilly is a 73 y.o. who presents for a Medicare Wellness preventive visit.  Visit Complete: In person    Persons Participating in Visit:  wife  and patient was present during visit.  AWV Questionnaire: Yes: Patient Medicare AWV questionnaire was completed by the patient on 06/14/23; I have confirmed that all information answered by patient is correct and no changes since this date.  Cardiac Risk Factors include: advanced age (>87men, >51 women);dyslipidemia;hypertension;male gender     Objective:    Today's Vitals   06/14/23 1015  BP: 110/68  Pulse: 61  Temp: 98.1 F (36.7 C)  SpO2: 97%  Weight: 159 lb 6.4 oz (72.3 kg)  Height: 5\' 6"  (1.676 m)   Body mass index is 25.73 kg/m.     06/14/2023   10:19 AM 09/15/2022    1:35 PM 06/06/2022   10:52 AM 01/18/2022   12:51 PM 01/09/2021    7:02 AM 11/17/2020    3:29 PM 07/13/2020    8:15 AM  Advanced Directives  Does Patient Have a Medical Advance Directive? Yes Yes Yes Yes No Yes No  Type of Estate agent of [place];Living will Healthcare Power of [employer] of [place];Living will Living will  Living will   Does patient want to make changes to medical advance directive?  No - Patient declined    No - Patient declined   Copy of Healthcare Power of Attorney in Chart? No - copy requested  No - copy requested      Would patient like information on creating a medical advance directive?     No - Patient declined  No - Patient declined    Current Medications (verified) Outpatient Encounter Medications as of 06/14/2023  Medication Sig   Alirocumab (PRALUENT) 150 MG/ML SOAJ Inject 1 mL into the skin every 14 (fourteen) days.   allopurinol (ZYLOPRIM) 300 MG tablet TAKE 1 TABLET BY MOUTH DAILY FOR GOUT   aspirin EC 81 MG tablet Take 1 tablet (81 mg total) by mouth daily. Swallow whole.   B Complex-C (VITAMIN B + C COMPLEX PO)    Cholecalciferol (VITAMIN D-3) 25 MCG (1000 UT) CAPS  Take by mouth daily. Per patient taking 1,000 units daily   clopidogrel (PLAVIX) 75 MG tablet Take 1 tablet (75 mg total) by mouth daily.   colchicine 0.6 MG tablet TAKE 2 TABS ORALLY AT FIRST SIGN OF FLARE & 1 TAB 1 HR LATER. THEN 1 TAB DAILY UNTIL FLARE RESOLVED   empagliflozin (JARDIANCE) 10 MG TABS tablet Take 1 tablet (10 mg total) by mouth daily before breakfast.   ferrous sulfate 325 (65 FE) MG tablet Take 325 mg by mouth daily with breakfast.   fluticasone (FLONASE) 50 MCG/ACT nasal spray Place 1 spray into both nostrils daily as needed for allergies or rhinitis.   metoprolol succinate (TOPROL XL) 25 MG 24 hr tablet Take 0.5 tablets (12.5 mg total) by mouth daily.   MILK THISTLE PO Take 500 mg by mouth daily.   nitroGLYCERIN (NITROSTAT) 0.4 MG SL tablet Place 1 tablet (0.4 mg total) under the tongue every 5 (five) minutes as needed for chest pain.   potassium chloride SA (KLOR-CON M20) 20 MEQ tablet TAKE 1 TABLET BY MOUTH ONLY WHEN YOU TAKE THE TORSEMIDE   sacubitril-valsartan (ENTRESTO) 24-26 MG TAKE 1 TABLET TWICE A DAY   spironolactone (ALDACTONE) 25 MG tablet Take 0.5 tablets (12.5 mg total) by mouth 3 (three) times a week. Take on Mondays,  Wednesdays, and Fridays.   torsemide (DEMADEX) 20 MG tablet Take 1 tablet (20 mg total) by mouth daily as needed (water weight gain).   No facility-administered encounter medications on file as of 06/14/2023.    Allergies (verified) Ezetimibe, Repatha [evolocumab], Statins, and Penicillins   History: Past Medical History:  Diagnosis Date   Actinic keratosis    Alkaline phosphatase raised    Anemia    Blood transfusion without reported diagnosis 2016   Cardiac arrhythmia    Cardiomyopathy [facility])    CHF (congestive heart failure) (HCC)    Coronary arteriosclerosis    ED (erectile dysfunction)    Gout 03/09/2017   Hemangioma 08/24/2018   Hyperlipidemia    Hypertension    Hypertrophic scar    Malignant neoplasm of prostate (HCC) 2013    Was told that less than 1% of cancer   Melanocytic nevi of trunk    Melanocytic nevus of skin    Senile hyperkeratosis    STEMI (ST elevation myocardial infarction) (HCC) 2016   Past Surgical History:  Procedure Laterality Date   ICD IMPLANT     LEFT HEART CATH AND CORONARY ANGIOGRAPHY N/A 01/18/2022   Procedure: LEFT HEART CATH AND CORONARY ANGIOGRAPHY;  Surgeon: Lyn Records, MD;  Location: [facility] INVASIVE CV LAB;  Service: Cardiovascular;  Laterality: N/A;   PROSTATECTOMY  2013   SUBQ ICD CHANGEOUT N/A 06/07/2020   Procedure: SUBQ ICD CHANGEOUT;  Surgeon: Marinus Maw, MD;  Location: [facility] INVASIVE CV LAB;  Service: Cardiovascular;  Laterality: N/A;   Family History  Problem Relation Age of Onset   Hypertension Father    High Cholesterol Father    Stomach cancer Father    Bladder Cancer Brother    Diabetes Brother    Evelene Croon Parkinson White syndrome Son    Migraines Son    Prostate cancer Neg Hx    Breast cancer Neg Hx    Colon cancer Neg Hx    Pancreatic cancer Neg Hx    Social History   Socioeconomic History   Marital status: Married    Spouse name: [name]   Number of children: 3   Years of education: Not on file   Highest education level: Associate degree: occupational, Scientist, product/process development, or vocational program  Occupational History    Comment: retired  Tobacco Use   Smoking status: Never   Smokeless tobacco: Never  Vaping Use   Vaping status: Never Used  Substance and Sexual Activity   Alcohol use: Yes    Alcohol/week: 2.0 standard drinks of alcohol    Types: 2 Glasses of wine per week    Comment: 2 glasses of wine per week   Drug use: Never   Sexual activity: Yes    Birth control/protection: None  Other Topics Concern   Not on file  Social History Narrative   Moved to [place] in Oct 2020 to be closer to grandchildren   Retired Teaching laboratory technician, Personnel officer, Risk analyst   Married   3 sons   Social Drivers of Corporate investment banker Strain: Low Risk   (06/14/2023)   Overall Financial Resource Strain (CARDIA)    Difficulty of Paying Living Expenses: Not hard at all  Food Insecurity: No Food Insecurity (06/14/2023)   Hunger Vital Sign    Worried About Running Out of Food in the Last Year: Never true    Ran Out of Food in the Last Year: Never true  Transportation Needs: No Transportation Needs (06/14/2023)   PRAPARE - Transportation  Lack of Transportation (Medical): No    Lack of Transportation (Non-Medical): No  Physical Activity: Sufficiently Active (06/14/2023)   Exercise Vital Sign    Days of Exercise per Week: 7 days    Minutes of Exercise per Session: 60 min  Stress: No Stress Concern Present (06/14/2023)   [employer] of Occupational Health - Occupational Stress Questionnaire    Feeling of Stress : Not at all  Social Connections: Socially Integrated (06/14/2023)   Social Connection and Isolation Panel [NHANES]    Frequency of Communication with Friends and Family: More than three times a week    Frequency of Social Gatherings with Friends and Family: Once a week    Attends Religious Services: More than 4 times per year    Active Member of [employer] or Organizations: Yes    Attends Engineer, structural: More than 4 times per year    Marital Status: Married    Tobacco Counseling Counseling given: Not Answered    Clinical Intake:  Pre-visit preparation completed: Yes  Pain : No/denies pain     BMI - recorded: 25.73 Nutritional Status: BMI 25 -29 Overweight Nutritional Risks: None Diabetes: No  Lab Results  Component Value Date   HGBA1C 5.9 06/01/2022     How often do you need to have someone help you when you read instructions, pamphlets, or other written materials from your doctor or pharmacy?: 1 - Never  Interpreter Needed?: No  Information entered by :: Lanier Ensign, LPN   Activities of Daily Living     06/14/2023    7:45 AM  In your present state of health, do you have any difficulty  performing the following activities:  Hearing? 0  Vision? 0  Difficulty concentrating or making decisions? 0  Walking or climbing stairs? 0  Dressing or bathing? 0  Doing errands, shopping? 0  Preparing Food and eating ? N  Using the Toilet? N  In the past six months, have you accidently leaked urine? N  Do you have problems with loss of bowel control? N  Managing your Medications? N  Managing your Finances? N  Housekeeping or managing your Housekeeping? N    Patient Care Team: Jarold Motto, [name] as PCP - General (Physician Assistant) Orbie Pyo, MD as PCP - Cardiology (Cardiology) Sedalia Muta, PT as Physical Therapist (Physical Therapy) Bjorn Pippin, MD as Attending Physician (Urology) Margaretmary Dys, MD as Consulting Physician (Radiation Oncology) Axel Filler Larna Daughters, NP as Nurse Practitioner (Hematology and Oncology) Maryclare Labrador, RN as Registered Nurse Earlene Plater, Rita Ohara, [facility] (Inactive) as Pharmacist (Pharmacist)  Indicate any recent Medical Services you may have received from other than [facility] providers in the past year (date may be approximate).     Assessment:   This is a routine wellness examination for Phinehas.  Hearing/Vision screen Hearing Screening - Comments:: Pt denies any hearing issues  Vision Screening - Comments:: Wears rx glasses - up to date with routine eye exams with Dr Hyacinth Meeker     Goals Addressed             This Visit's Progress    Patient Stated       Maintain health and activity        Depression Screen     06/14/2023   10:20 AM 06/06/2022   10:43 AM 05/24/2022   10:59 AM 03/15/2021   12:05 PM 11/10/2020   11:20 AM 08/25/2019    1:25 PM  PHQ 2/9 Scores  PHQ - 2 Score  0 0 0 0 0 0  PHQ- 9 Score  0 0       Fall Risk     06/14/2023    7:45 AM 06/05/2022    8:35 PM 05/24/2022   10:59 AM 05/23/2021    8:10 AM 11/10/2020   11:20 AM  Fall Risk   Falls in the past year? 0 1 0 1 1  Number falls in past yr: 0 0 0 0 1  Injury with  Fall? 0 0 0 1 1  Comment    broke ribs and clavical   Risk for fall due to : No Fall Risks  No Fall Risks History of fall(s)   Follow up Falls prevention discussed Falls prevention discussed  Falls evaluation completed     MEDICARE RISK AT HOME:  Medicare Risk at Home Any stairs in or around the home?: (Patient-Rptd) Yes If so, are there any without handrails?: (Patient-Rptd) Yes Home free of loose throw rugs in walkways, pet beds, electrical cords, etc?: (Patient-Rptd) Yes Adequate lighting in your home to reduce risk of falls?: (Patient-Rptd) Yes Life alert?: (Patient-Rptd) No Use of a cane, walker or w/c?: (Patient-Rptd) No Grab bars in the bathroom?: (Patient-Rptd) Yes Shower chair or bench in shower?: (Patient-Rptd) No Elevated toilet seat or a handicapped toilet?: (Patient-Rptd) No  TIMED UP AND GO:  Was the test performed?  Yes  Length of time to ambulate 10 feet: 10 sec Gait steady and fast without use of assistive device  Cognitive Function: 6CIT completed        06/14/2023   10:21 AM  6CIT Screen  What Year? 0 points  What month? 0 points  What time? 0 points  Count back from 20 0 points  Months in reverse 4 points  Repeat phrase 10 points  Total Score 14 points    Immunizations Immunization History  Administered Date(s) Administered   Fluad Quad(high Dose 65+) 02/06/2020, 02/10/2021   Influenza, Seasonal, Injecte, Preservative Fre 04/06/2023   Influenza,inj,Quad PF,6+ Mos 03/01/2022   PFIZER(Purple Top)SARS-COV-2 Vaccination 05/10/2019, 05/31/2019   Tdap 02/04/2015    Screening Tests Health Maintenance  Topic Date Due   Pneumonia Vaccine 71+ Years old (1 of 2 - PCV) Never done   COVID-19 Vaccine (3 - Pfizer risk series) 09/20/2023 (Originally 06/28/2019)   Colonoscopy  03/06/2026 (Originally 03/04/2014)   INFLUENZA VACCINE  10/05/2023   Medicare Annual Wellness (AWV)  06/13/2024   DTaP/Tdap/Td (2 - Td or Tdap) 02/03/2025   Hepatitis C Screening   Completed   HPV VACCINES  Aged Out   Meningococcal B Vaccine  Aged Out   Zoster Vaccines- Shingrix  Discontinued    Health Maintenance  Health Maintenance Due  Topic Date Due   Pneumonia Vaccine 38+ Years old (1 of 2 - PCV) Never done   Health Maintenance Items Addressed: See Nurse Notes  Additional Screening:  Vision Screening: Recommended annual ophthalmology exams for early detection of glaucoma and other disorders of the eye.  Dental Screening: Recommended annual dental exams for proper oral hygiene  Community Resource Referral / Chronic Care Management: CRR required this visit?  No   CCM required this visit?  No     Plan:     I have personally reviewed and noted the following in the patient's chart:   Medical and social history Use of alcohol, tobacco or illicit drugs  Current medications and supplements including opioid prescriptions. Patient is not currently taking opioid prescriptions. Functional ability and status Nutritional status Physical  activity Advanced directives List of other physicians Hospitalizations, surgeries, and ER visits in previous 12 months Vitals Screenings to include cognitive, depression, and falls Referrals and appointments  In addition, I have reviewed and discussed with patient certain preventive protocols, quality metrics, and best practice recommendations. A written personalized care plan for preventive services as well as general preventive health recommendations were provided to patient.     Marzella Schlein, LPN   [id number]/1191   After Visit Summary: (In Person-Printed) AVS printed and given to the patient  Notes: Pt recall from 6 CIT score was 14 this AWV

## 2023-06-14 NOTE — Patient Instructions (Addendum)
 [name] , Thank you for taking time to come for your Medicare Wellness Visit. I appreciate your ongoing commitment to your health goals. Please review the following plan we discussed and let me know if I can assist you in the future.   Referrals/Orders/Follow-Ups/Clinician Recommendations: Maintain health and activity  This is a list of the screening recommended for you and due dates:  Health Maintenance  Topic Date Due   Pneumonia Vaccine (1 of 2 - PCV) Never done   COVID-19 Vaccine (3 - Pfizer risk series) 09/20/2023*   Colon Cancer Screening  03/06/2026*   Flu Shot  10/05/2023   Medicare Annual Wellness Visit  06/13/2024   DTaP/Tdap/Td vaccine (2 - Td or Tdap) 02/03/2025   Hepatitis C Screening  Completed   HPV Vaccine  Aged Out   Meningitis B Vaccine  Aged Out   Zoster (Shingles) Vaccine  Discontinued  *Topic was postponed. The date shown is not the original due date.    Advanced directives: (Copy Requested) Please bring a copy of your health care power of attorney and living will to the office to be added to your chart at your convenience. You can mail to [facility] [address] [place], [place] [address] or email to [email or web address]  Next Medicare Annual Wellness Visit scheduled for next year: Yes

## 2023-07-12 ENCOUNTER — Encounter: Payer: Self-pay | Admitting: Physician Assistant

## 2023-07-12 LAB — COLOGUARD: COLOGUARD: NEGATIVE

## 2023-07-23 ENCOUNTER — Ambulatory Visit (INDEPENDENT_AMBULATORY_CARE_PROVIDER_SITE_OTHER): Payer: Medicare Other

## 2023-07-23 DIAGNOSIS — I255 Ischemic cardiomyopathy: Secondary | ICD-10-CM

## 2023-07-23 DIAGNOSIS — I5042 Chronic combined systolic (congestive) and diastolic (congestive) heart failure: Secondary | ICD-10-CM

## 2023-07-24 ENCOUNTER — Ambulatory Visit: Payer: Self-pay | Admitting: Internal Medicine

## 2023-07-24 LAB — CUP PACEART REMOTE DEVICE CHECK
Battery Remaining Percentage: 66 %
Date Time Interrogation Session: 20250519092000
HighPow Impedance: 80 Ohm
Implantable Lead Connection Status: 753985
Implantable Lead Implant Date: 20170727
Implantable Lead Location: 753862
Implantable Lead Model: 3401
Implantable Pulse Generator Implant Date: 20220404
Pulse Gen Serial Number: 156707

## 2023-08-14 ENCOUNTER — Other Ambulatory Visit: Payer: Self-pay | Admitting: Internal Medicine

## 2023-08-14 ENCOUNTER — Other Ambulatory Visit: Payer: Self-pay | Admitting: Physician Assistant

## 2023-08-14 DIAGNOSIS — G8929 Other chronic pain: Secondary | ICD-10-CM

## 2023-08-20 ENCOUNTER — Encounter (HOSPITAL_BASED_OUTPATIENT_CLINIC_OR_DEPARTMENT_OTHER): Payer: Self-pay

## 2023-08-20 ENCOUNTER — Emergency Department (HOSPITAL_BASED_OUTPATIENT_CLINIC_OR_DEPARTMENT_OTHER)
Admission: EM | Admit: 2023-08-20 | Discharge: 2023-08-20 | Disposition: A | Discharge status: Home / Self Care | Attending: Emergency Medicine | Admitting: Emergency Medicine

## 2023-08-20 ENCOUNTER — Emergency Department (HOSPITAL_BASED_OUTPATIENT_CLINIC_OR_DEPARTMENT_OTHER): Admitting: Radiology

## 2023-08-20 ENCOUNTER — Other Ambulatory Visit: Payer: Self-pay

## 2023-08-20 ENCOUNTER — Telehealth: Payer: Self-pay | Admitting: Internal Medicine

## 2023-08-20 DIAGNOSIS — Z79899 Other long term (current) drug therapy: Secondary | ICD-10-CM | POA: Insufficient documentation

## 2023-08-20 DIAGNOSIS — R059 Cough, unspecified: Secondary | ICD-10-CM | POA: Diagnosis not present

## 2023-08-20 DIAGNOSIS — R0689 Other abnormalities of breathing: Secondary | ICD-10-CM | POA: Diagnosis not present

## 2023-08-20 DIAGNOSIS — I130 Hypertensive heart and chronic kidney disease with heart failure and stage 1 through stage 4 chronic kidney disease, or unspecified chronic kidney disease: Secondary | ICD-10-CM | POA: Diagnosis not present

## 2023-08-20 DIAGNOSIS — Z7902 Long term (current) use of antithrombotics/antiplatelets: Secondary | ICD-10-CM | POA: Insufficient documentation

## 2023-08-20 DIAGNOSIS — R062 Wheezing: Secondary | ICD-10-CM | POA: Diagnosis present

## 2023-08-20 DIAGNOSIS — I5042 Chronic combined systolic (congestive) and diastolic (congestive) heart failure: Secondary | ICD-10-CM | POA: Insufficient documentation

## 2023-08-20 DIAGNOSIS — N189 Chronic kidney disease, unspecified: Secondary | ICD-10-CM | POA: Insufficient documentation

## 2023-08-20 DIAGNOSIS — Z8546 Personal history of malignant neoplasm of prostate: Secondary | ICD-10-CM | POA: Diagnosis not present

## 2023-08-20 DIAGNOSIS — R7989 Other specified abnormal findings of blood chemistry: Secondary | ICD-10-CM | POA: Insufficient documentation

## 2023-08-20 DIAGNOSIS — I2510 Atherosclerotic heart disease of native coronary artery without angina pectoris: Secondary | ICD-10-CM | POA: Diagnosis not present

## 2023-08-20 DIAGNOSIS — Z7982 Long term (current) use of aspirin: Secondary | ICD-10-CM | POA: Insufficient documentation

## 2023-08-20 DIAGNOSIS — Z8679 Personal history of other diseases of the circulatory system: Secondary | ICD-10-CM

## 2023-08-20 LAB — TROPONIN T, HIGH SENSITIVITY: Troponin T High Sensitivity: <15 ng/L (ref <19)

## 2023-08-20 LAB — CBC WITH DIFFERENTIAL/PLATELET
Abs Immature Granulocytes: 0.02 10*3/uL (ref 0.00–0.07)
Basophils Absolute: 0 10*3/uL (ref 0.0–0.1)
Basophils Relative: 1 %
Eosinophils Absolute: 0.3 10*3/uL (ref 0.0–0.5)
Eosinophils Relative: 4 %
HCT: 42.1 % (ref 39.0–52.0)
Hemoglobin: 14.3 g/dL (ref 13.0–17.0)
Immature Granulocytes: 0 %
Lymphocytes Relative: 21 %
Lymphs Abs: 1.5 10*3/uL (ref 0.7–4.0)
MCH: 33.4 pg (ref 26.0–34.0)
MCHC: 34 g/dL (ref 30.0–36.0)
MCV: 98.4 fL (ref 80.0–100.0)
Monocytes Absolute: 0.6 10*3/uL (ref 0.1–1.0)
Monocytes Relative: 9 %
Neutro Abs: 4.6 10*3/uL (ref 1.7–7.7)
Neutrophils Relative %: 65 %
Platelets: 217 10*3/uL (ref 150–400)
RBC: 4.28 MIL/uL (ref 4.22–5.81)
RDW: 12.7 % (ref 11.5–15.5)
WBC: 7.1 10*3/uL (ref 4.0–10.5)
nRBC: 0 % (ref 0.0–0.2)

## 2023-08-20 LAB — RESP PANEL BY RT-PCR (RSV, FLU A&B, COVID)  RVPGX2
Influenza A by PCR: NEGATIVE
Influenza B by PCR: NEGATIVE
Resp Syncytial Virus by PCR: NEGATIVE
SARS Coronavirus 2 by RT PCR: NEGATIVE

## 2023-08-20 LAB — COMPREHENSIVE METABOLIC PANEL WITH GFR
ALT: 20 U/L (ref 0–44)
AST: 20 U/L (ref 15–41)
Albumin: 4.3 g/dL (ref 3.5–5.0)
Alkaline Phosphatase: 141 U/L — ABNORMAL HIGH (ref 38–126)
Anion gap: 15 (ref 5–15)
BUN: 24 mg/dL — ABNORMAL HIGH (ref 8–23)
CO2: 21 mmol/L — ABNORMAL LOW (ref 22–32)
Calcium: 9.7 mg/dL (ref 8.9–10.3)
Chloride: 104 mmol/L (ref 98–111)
Creatinine, Ser: 1.06 mg/dL (ref 0.61–1.24)
GFR, Estimated: >60 mL/min (ref >60)
Glucose, Bld: 88 mg/dL (ref 70–99)
Potassium: 3.8 mmol/L (ref 3.5–5.1)
Sodium: 140 mmol/L (ref 135–145)
Total Bilirubin: 0.5 mg/dL (ref 0.0–1.2)
Total Protein: 7.3 g/dL (ref 6.5–8.1)

## 2023-08-20 LAB — PRO BRAIN NATRIURETIC PEPTIDE: Pro Brain Natriuretic Peptide: 1357 pg/mL — ABNORMAL HIGH (ref <300.0)

## 2023-08-20 NOTE — Discharge Instructions (Addendum)
 Take half a tablet of your torsemide  20mg  (or 10mg  of torsemide ) and one 20 meq tablet of potassium.  Call Dr. Lorie Rook for further recommendations

## 2023-08-20 NOTE — ED Notes (Signed)
 DC paperwork given and verbally understood.

## 2023-08-20 NOTE — Telephone Encounter (Signed)
 Pt had wheezing the other night and wants to talk to a nurse about it

## 2023-08-20 NOTE — Telephone Encounter (Signed)
 Patient identification verified by 2 forms. Sims Duck, RN     Called and spoke to patient  Patient states:  - patient states he and his wife were having intercourse and afterwards they both heard a snap and crackle sound coming from chest area. This lasted for about 2 hours.  - He did not feel pain at the same time of sounds but he did feel different.  - Sound could be heard when patient was open mouthed breathing and when mouth was closed.  - patient does not weigh himself daily but has not noticed any significant changes to weight.  - Sound and uneasy feeling was relieved with rest. He woke up the next morning and felt better.   Patient denies:  - SOB, chest pain, cough, mucus production.              Interventions/Plan: - Recommended patient be evaluated at urgent care for CXR and f/u with PCP office.  - Encounter forwarded to primary cardiologist for review/recommendations.  - Patient will update us  once he is seen.    Reviewed ED warning signs/precautions  Patient agrees with plan, no questions at this time

## 2023-08-20 NOTE — ED Notes (Signed)
Urine in the Lab

## 2023-08-20 NOTE — ED Triage Notes (Signed)
 Pt c/o wheezing/ rattling onset Saturday, advises the other night, exercising & I started rattling in my lungs when I exhaled. Advises he does not feel SHOB, I just feel different.   Advises not on diuretics- have an emergency diuretic, didn't take it.

## 2023-08-20 NOTE — ED Provider Notes (Signed)
 [facility] Provider Note   CSN: [id number] Arrival date & time: 08/20/23  1053     Patient presents with: Wheezing   Kurt Reilly is a 73 y.o. male.   HPI      Snapping and crackling breathing at night, Saturday night Had intercourse, was winded and then laid down and his wife heard noises from his chest, a crackling--he heard it too but heard it more No chest pain, not feeling short of breath after he initially rested- just hearing rattling breathing Coughed up some phlegm, after that it improved and no continuing cough No fever No leg pain or swelling Flight to [place]  a few weeks ago, came back a week ago Sat No nausea or vomiting, no abdominal pain. No congestion, sore throat Did exercise today No hx of smoking, lung disease, etoh or drug use    Past Medical History:  Diagnosis Date   Actinic keratosis    Alkaline phosphatase raised    Anemia    Blood transfusion without reported diagnosis 2016   Cardiac arrhythmia    Cardiomyopathy [facility])    CHF (congestive heart failure) (HCC)    Coronary arteriosclerosis    ED (erectile dysfunction)    Gout 03/09/2017   Hemangioma 08/24/2018   Hyperlipidemia    Hypertension    Hypertrophic scar    Malignant neoplasm of prostate (HCC) 2013   Was told that less than 1% of cancer   Melanocytic nevi of trunk    Melanocytic nevus of skin    Senile hyperkeratosis    STEMI (ST elevation myocardial infarction) (HCC) 2016     Prior to Admission medications   Medication Sig Start Date End Date Taking? Authorizing Provider  Alirocumab  (PRALUENT ) 150 MG/ML SOAJ Inject 1 mL into the skin every 14 (fourteen) days. 05/16/22   Thukkani, Arun K, MD  allopurinol  (ZYLOPRIM ) 300 MG tablet TAKE 1 TABLET DAILY FOR GOUT 08/14/23   Alexander Iba, PA  aspirin  EC 81 MG tablet Take 1 tablet (81 mg total) by mouth daily. Swallow whole. 10/11/22   Thukkani, Arun K, MD  B Complex-C (VITAMIN B + C COMPLEX  PO)  12/09/22   [provider]  Cholecalciferol (VITAMIN D-3) 25 MCG (1000 UT) CAPS Take by mouth daily. Per patient taking 1,000 units daily    [provider]  clopidogrel  (PLAVIX ) 75 MG tablet Take 1 tablet (75 mg total) by mouth daily. 04/17/23   Thukkani, Arun K, MD  colchicine  0.6 MG tablet TAKE 2 TABS ORALLY AT FIRST SIGN OF FLARE & 1 TAB 1 HR LATER. THEN 1 TAB DAILY UNTIL FLARE RESOLVED 10/16/22   Alexander Iba, PA  empagliflozin  (JARDIANCE ) 10 MG TABS tablet TAKE 1 TABLET BY MOUTH DAILY BEFORE BREAKFAST. 08/15/23   Thukkani, Arun K, MD  ferrous sulfate 325 (65 FE) MG tablet Take 325 mg by mouth daily with breakfast.    [provider]  fluticasone (FLONASE) 50 MCG/ACT nasal spray Place 1 spray into both nostrils daily as needed for allergies or rhinitis.    [provider]  metoprolol  succinate (TOPROL  XL) 25 MG 24 hr tablet Take 0.5 tablets (12.5 mg total) by mouth daily. 12/11/22   Thukkani, Arun K, MD  MILK THISTLE PO Take 500 mg by mouth daily.    [provider]  nitroGLYCERIN  (NITROSTAT ) 0.4 MG SL tablet Place 1 tablet (0.4 mg total) under the tongue every 5 (five) minutes as needed for chest pain. 12/08/22   Thukkani,  Arun K, MD  potassium chloride  SA (KLOR-CON  M20) 20 MEQ tablet TAKE 1 TABLET BY MOUTH ONLY WHEN YOU TAKE THE TORSEMIDE  12/08/22   Thukkani, Arun K, MD  sacubitril-valsartan (ENTRESTO ) 24-26 MG TAKE 1 TABLET TWICE A DAY 04/20/23   Thukkani, Arun K, MD  spironolactone  (ALDACTONE ) 25 MG tablet Take 0.5 tablets (12.5 mg total) by mouth 3 (three) times a week. Take on Mondays, Wednesdays, and Fridays. 01/17/23   Thukkani, Arun K, MD  torsemide  (DEMADEX ) 20 MG tablet Take 1 tablet (20 mg total) by mouth daily as needed (water weight gain). 12/08/22   Thukkani, Arun K, MD    Allergies: Ezetimibe, Repatha  [evolocumab ], Statins, and Penicillins    Review of Systems  Updated Vital Signs BP 133/86   Pulse (!) 54   Temp 97.6 F (36.4  C)   Resp 10   SpO2 99%   Physical Exam Vitals and nursing note reviewed.  Constitutional:      General: He is not in acute distress.    Appearance: He is well-developed. He is not diaphoretic.  HENT:     Head: Normocephalic and atraumatic.   Eyes:     Conjunctiva/sclera: Conjunctivae normal.    Cardiovascular:     Rate and Rhythm: Normal rate and regular rhythm.     Heart sounds: Normal heart sounds. No murmur heard.    No friction rub. No gallop.  Pulmonary:     Effort: Pulmonary effort is normal. No respiratory distress.     Breath sounds: Normal breath sounds. No wheezing or rales.  Abdominal:     General: There is no distension.     Palpations: Abdomen is soft.     Tenderness: There is no abdominal tenderness. There is no guarding.   Musculoskeletal:     Cervical back: Normal range of motion.   Skin:    General: Skin is warm and dry.   Neurological:     Mental Status: He is alert and oriented to person, place, and time.     (all labs ordered are listed, but only abnormal results are displayed) Labs Reviewed  COMPREHENSIVE METABOLIC PANEL WITH GFR - Abnormal; Notable for the following components:      Result Value   CO2 21 (*)    BUN 24 (*)    Alkaline Phosphatase 141 (*)    All other components within normal limits  PRO BRAIN NATRIURETIC PEPTIDE - Abnormal; Notable for the following components:   Pro Brain Natriuretic Peptide 1,357.0 (*)    All other components within normal limits  RESP PANEL BY RT-PCR (RSV, FLU A&B, COVID)  RVPGX2  CBC WITH DIFFERENTIAL/PLATELET  TROPONIN T, HIGH SENSITIVITY    EKG: EKG Interpretation Date/Time:  Monday August 20 2023 11:27:39 EDT Ventricular Rate:  58 PR Interval:  172 QRS Duration:  103 QT Interval:  425 QTC Calculation: 418 R Axis:   44  Text Interpretation: Sinus rhythm Ventricular trigeminy Probable left atrial enlargement Borderline low voltage, extremity leads Nonspecific T abnormalities, diffuse leads No  significant change since last tracing Confirmed by Scarlette Currier ([id number]) on 08/20/2023 11:54:31 AM  Radiology: [facility] Chest 2 View Result Date: 08/20/2023 CLINICAL DATA:  Wheezing.  History of prostate cancer. EXAM: CHEST - 2 VIEW COMPARISON:  X-ray 11/05/2020 FINDINGS: No consolidation, pneumothorax or effusion. No edema. Normal cardiopericardial silhouette. Defibrillator implanted left hemithorax. Vertical lead anterior to the sternum. Non healed old right clavicle fracture. Old right rib fractures. Overlapping cardiac leads. Degenerative changes of the spine on lateral  view. IMPRESSION: Chronic changes.  No acute cardiopulmonary disease. Electronically Signed   By: Adrianna Horde M.D.   On: 08/20/2023 13:03     Procedures   Medications Ordered in the ED - No data to display                                   73yo male with history of CAD on aspirin , plavix , praluent ,, chronic combined systolic and diastolic heart failure, LV mural thrombus (not present on most recent echos), hyperlipidemia, aortic atherosclerosis, hypertension, CKD who presents with concern for crackling breathing.  EKG without acute chanes.  CXR evaluated by me without acute abnormalities.  Labs evaluated by me show normal troponin, no sign of ACS,no clinically significant anemia or leukocytosis, no electrolyte abnormalities.   Pro BNP 1357, previous pro BNP 500s.  Concern for worsening CHF, possibly was symptomatic Saturday night although he is feeling better and asymptomatic now. recommend taking 10mg  torsemide  x1, follow up with Cardiology. Discussed return precautions. Patient discharged in stable condition with understanding of reasons to return.       Final diagnoses:  History of CHF (congestive heart failure)  Elevated brain natriuretic peptide (BNP) level  Noisy breathing    ED Discharge Orders          Ordered    Ambulatory referral to Cardiology        08/20/23 1426               Scarlette Currier, MD 08/20/23 2234

## 2023-08-21 ENCOUNTER — Other Ambulatory Visit: Payer: Self-pay | Admitting: Internal Medicine

## 2023-08-21 DIAGNOSIS — E785 Hyperlipidemia, unspecified: Secondary | ICD-10-CM

## 2023-08-21 DIAGNOSIS — I25118 Atherosclerotic heart disease of native coronary artery with other forms of angina pectoris: Secondary | ICD-10-CM

## 2023-08-22 ENCOUNTER — Telehealth: Payer: Self-pay | Admitting: Internal Medicine

## 2023-08-22 NOTE — Telephone Encounter (Signed)
 Called and spoke to pt. Advised him to take his BP one time today at bedtime, and then again tomorrow morning; he should then call us  back to report these and we will ask Dr. Lorie Rook for further instructions on what meds to start/stop. Pt verbalized understanding.

## 2023-08-22 NOTE — Telephone Encounter (Signed)
  Pt c/o BP issue: STAT if pt c/o blurred vision, one-sided weakness or slurred speech.  STAT if BP is GREATER than 180/120 TODAY.  STAT if BP is LESS than 90/60 and SYMPTOMATIC TODAY  1. What is your BP concern? Low blood pressure  2. Have you taken any BP medication today? Not since 8 am  3. What are your last 5 BP readings? 90/42  4. Are you having any other symptoms (ex. Dizziness, headache, blurred vision, passed out)?  fatigue

## 2023-08-22 NOTE — Telephone Encounter (Signed)
 Patient called in c/o feeling very tired and very fatigued. His BP today is 90/41, HR around 56. He denies CP, SOB, dizziness/lightheadedness. He states this started today (although note from Dr. Arthuro Billow OV on 05/10/2023 says he has had some issues with low BP's and fatigue in the morning). He was in the ER on 08/20/23 with c/o wheezing, per pt, crackles and pops with exhalation. He never took torsemide  10 mg (one dose), as advised in ER visit note (BNP was 1,357). He states they told him to only take it if he had another episode of the wheezing; he never had any other episodes so did not take the torsemide .  He has not taken Entresto , Metoprolol , Spironolactone . Pt was advised to not take these tonight and if his BP remained under 100/50 by tomorrow morning, to also hold am BP meds. He verbalized understanding. He will call again if needed during the night (made them aware of on-call providers if needed). ED precautions reviewed with wife and pt. Will forward to Dr. Lorie Rook for recommendations.

## 2023-08-23 ENCOUNTER — Encounter: Payer: Self-pay | Admitting: Internal Medicine

## 2023-08-23 NOTE — Telephone Encounter (Signed)
 Pt calling back with BP Readings: 7:30 am 108/79 64 8:45 am 106/68 61 Pt said to let you know he was not feeling as fatigued today

## 2023-08-23 NOTE — Telephone Encounter (Signed)
 Called patient back and informed him of Dr. Arthuro Billow advisement.   Thukkani, Arun K, MD to [facility] Triage  Lowell Rude, RN  AT    08/23/23  9:59 AM Lets continue to hold and have him let us  know how he is doing on Friday.   Patient stated he would send a mychart message tomorrow of his most recent BP and HR's.

## 2023-08-24 NOTE — Progress Notes (Unsigned)
 Cardiology Office Note:   Date:  08/24/2023  ID:  Kurt Reilly, DOB 1950-12-15, MRN [id number] PCP:  Alexander Iba, PA  [facility] Providers Cardiologist:  Alyssa Backbone, MD Referring MD: Alexander Iba, [name]  Chief Complaint/Reason for Referral: Follow-up for coronary artery disease, ischemic cardiomyopathy ASSESSMENT:    1. Coronary artery disease of native artery of native heart with stable angina pectoris (HCC)   2. Chronic combined systolic and diastolic heart failure (HCC)   3. LV (left ventricular) mural thrombus   4. Hyperlipidemia LDL goal <70   5. Aortic atherosclerosis (HCC)   6. Essential hypertension   7. CKD (chronic kidney disease) stage 2, GFR 60-89 ml/min      PLAN:   In order of problems listed above: Coronary artery disease disease: Continue aspirin  81 mg, Plavix  75 mg, Praluent , and as needed nitroglycerin .  Toprol ?  *** Combined systolic and diastolic heart failure: Blood pressure is low recently; continue Entresto  24 x 26 mm twice daily.  Add back other medications?*** LV thrombus: Serial echogram's have demonstrated no recurrence of LV thrombus.  Continue aspirin  81 mg and Plavix  75 mg.   Hyperlipidemia: Lipid panel at goal recently; continue Praluent  150 mg every 2 weeks. Aortic atherosclerosis: Continue aspirin  81 mg and Praluent  150 mg every 2 weeks. Hypertension: *** CKD stage II: Continue Entresto  24 x 26 mg and Jardiance  10 mg for renal protection.  \            Dispo:  No follow-ups on file.      Medication Adjustments/Labs and Tests Ordered: Current medicines are reviewed at length with the patient today.  Concerns regarding medicines are outlined above.  The following changes have been made:  no change   Labs/tests ordered: No orders of the defined types were placed in this encounter.   Medication Changes: No orders of the defined types were placed in this encounter.   Current medicines are reviewed at length with the patient  today.  The patient does not have concerns regarding medicines.  I spent 39 minutes reviewing all clinical data during and prior to this visit including all relevant imaging studies, laboratories, clinical information from other health systems and prior notes from both Cardiology and other specialties, interviewing the patient, conducting a complete physical examination, and coordinating care in order to formulate a comprehensive and personalized evaluation and treatment plan.   History of Present Illness:      FOCUSED PROBLEM LIST:   CAD PCI of LAD and bifurcation OM left circumflex remotely LAD CTO, left circumflex CTO 2023 Heart team meeting 2023 >>> medical therapy ICM status post ICD EF 30 to 35%, severe anterior akinesis moderate MR grade 2 diastolic dysfunction TTE 01/2023 LV thrombus LV thrombus TTE April 2024 >> Eliquis  initiated Shared decision making >> trial off anticoagulation No LV thrombus TTE August 2024 while off anticoagulation No LV thrombus TTE November 2024 off anticoagulation Hyperlipidemia LP(a) 52.8 Aortic atherosclerosis Hypertension CKD stage II  March 2024:  The patient is a 73 y.o. male with the indicated medical history here for cardiology follow up.  He was seen in December in the cardiology clinic.  At that point in time he was feeling well walking on a treadmill daily for about 45 minutes.  He endorses chest pain when he walks up an incline.   The patient is here today with his wife.  When he exerts himself in the yard such as raking leaves or uses the treadmill going up an incline he  does develop angina.  This remits with rest.  He does not have any pain at rest.  He did go to [place]  recently and when he was down there after the flight he did notice some ankle edema for a day or 2.  This has not recurred.  He fortunately is not required any emergency room visits or hospitalizations.  He denies any presyncope or syncope.  He has had no severe bleeding or  bruising.  He is otherwise well and tolerating his medications without significant issues.  Plan: Convert Coreg  to Toprol -XL 12.5 mg daily, increase Jardiance  to 25 mg, and obtain echocardiogram.   September 2024: The patient's Toprol  XL could not be tolerated due to low blood pressures.  He was taking this in the morning.  He was converted back to Coreg  6.25 twice daily.  The patient had an echocardiogram in April which showed possible LV thrombus.  His aspirin  and Plavix  was stopped and Eliquis  5 mg twice daily was started.  A repeat echocardiogram in August demonstrated resolution of the thrombus.  His Eliquis  was stopped and aspirin  and Plavix  restarted with a limited echocardiogram planned for 3 months with [facility]  to evaluate further.  His ICD was interrogated recently and was reassuring.   The patient is here with his wife today.  He is doing relatively well.  He is able to mow his grass without any issues with angina or dyspnea.  He has noticed some fatigue in the afternoon.  He did bring his blood pressure log in with him and his blood pressures around this time I usually relatively lower than his morning and evening blood pressures at up about 103 207 systolic.  He denies any peripheral edema, paroxysmal nocturnal dyspnea, ICD shocks, or any need for hospitalization or emergency room visit for cardiovascular issues.  He has been tolerating Praluent  well.  He has not required any nitroglycerin .  Plan: Repeat echocardiogram with [facility]  while on dual antiplatelet therapy, check lipid panel, change Coreg  6.25 mg twice daily to Toprol  12.5 mg at bedtime.  March 2024: Patient consents to use of AI scribe.  In the interim the patient's LDL was 62.  Repeat echocardiogram in November with [facility]  demonstrated no LV thrombus.  He experiences chest pain during exertion, particularly when using the treadmill. The pain is inducible by exertion, which he attributes to his blocked artery on the front wall of  the heart. He has not used nitroglycerin  before exercising. No chest pain at rest and no shortness of breath.  He is currently on aspirin  and Plavix  without significant bleeding issues, such as epistaxis or new bruises. He takes metoprolol  at 6:30 AM and spironolactone  three times a week in the morning. He experiences occasional lightheadedness, especially when changing positions quickly, which he attributes to his medications. He is not on statins but takes Praluent  for cholesterol management. He also takes a B vitamin supplement but is unsure about its necessity.  His blood pressure at home averages 114/55, but he feels low energy in the afternoons. Occasional lightheadedness is noted, but there are no blacking out spells or swelling in the legs.  He is on Jardiance , which causes frequent urination, and he finds it bothersome. He takes it in the morning and reports needing to use the bathroom frequently, including at night.  He experiences muscle pain without cramping and has been advised by a chiropractor to take magnesium.  He is planning a trip to [place] and [place] next year and is concerned about the  possibility of blood clots during travel. He has had two echocardiograms in the past that showed no clots, which he finds reassuring.  Plan change spironolactone  to bedtime dosing due to low blood pressures, obtain contrast echocardiogram to evaluate for LV thrombus, check lipids and LFTs.  Take as needed nitroglycerin  prior to exercise.  June 2025:  Patient consents to use of AI scribe. His lipid panel was found to be at goal.  Echocardiogram demonstrated unchanged ventricular function with no recurrence of LV thrombus.  He was seen in the emergency department recently for wheezing.  He did not report any chest pain or shortness of breath.  His proBNP was elevated.  His chest x-ray showed no acute abnormalities and his EKG was reassuring.  He was given 1 dose of torsemide  and discharged home.  He  called our office because his blood pressures was low.  His Entresto  metoprolol  and spironolactone  were held.  His blood pressure improved over the next few days and Entresto  low-dose was reinitiated.  He is here for follow-up.          Current Medications: No outpatient medications have been marked as taking for the 08/27/23 encounter (Appointment) with Eriyanna Kofoed K, MD.     Review of Systems:   Please see the history of present illness.    All other systems reviewed and are negative.     EKGs/Labs/Other Test Reviewed:   EKG: EKG from June 2026 demonstrates sinus rhythm with trigeminy nonspecific ST and T wave changes  EKG Interpretation Date/Time:    Ventricular Rate:    PR Interval:    QRS Duration:    QT Interval:    QTC Calculation:   R Axis:      Text Interpretation:           Risk Assessment/Calculations:          Physical Exam:   VS:  There were no vitals taken for this visit.   No BP recorded.  {Refresh Note OR Click here to enter BP  :1}***   Wt Readings from Last 3 Encounters:  06/14/23 159 lb 6.4 oz (72.3 kg)  05/10/23 156 lb (70.8 kg)  11/10/22 156 lb 6.4 oz (70.9 kg)      GENERAL:  No apparent distress, AOx3 HEENT:  No carotid bruits, +2 carotid impulses, no scleral icterus CAR: RRR no murmurs, gallops, rubs, or thrills RES:  Clear to auscultation bilaterally ABD:  Soft, nontender, nondistended, positive bowel sounds x 4 VASC:  +2 radial pulses, +2 carotid pulses NEURO:  CN 2-12 grossly intact; motor and sensory grossly intact PSYCH:  No active depression or anxiety EXT:  No edema, ecchymosis, or cyanosis  Signed, Lileigh Fahringer K Ravis Herne, MD  08/24/2023 1:45 PM    [facility] [address] [place], [place], [place]  [address] Phone: [phone]; Fax: [phone]   Note:  This document was prepared using Dragon voice recognition software and may include unintentional dictation errors.

## 2023-08-27 ENCOUNTER — Ambulatory Visit: Attending: Internal Medicine | Admitting: Internal Medicine

## 2023-08-27 ENCOUNTER — Encounter: Payer: Self-pay | Admitting: Internal Medicine

## 2023-08-27 VITALS — BP 100/62 | HR 76 | Ht 66.0 in | Wt 156.0 lb

## 2023-08-27 DIAGNOSIS — I10 Essential (primary) hypertension: Secondary | ICD-10-CM | POA: Diagnosis present

## 2023-08-27 DIAGNOSIS — E785 Hyperlipidemia, unspecified: Secondary | ICD-10-CM | POA: Insufficient documentation

## 2023-08-27 DIAGNOSIS — I5042 Chronic combined systolic (congestive) and diastolic (congestive) heart failure: Secondary | ICD-10-CM | POA: Diagnosis present

## 2023-08-27 DIAGNOSIS — I513 Intracardiac thrombosis, not elsewhere classified: Secondary | ICD-10-CM | POA: Diagnosis not present

## 2023-08-27 DIAGNOSIS — I25118 Atherosclerotic heart disease of native coronary artery with other forms of angina pectoris: Secondary | ICD-10-CM | POA: Diagnosis present

## 2023-08-27 DIAGNOSIS — N182 Chronic kidney disease, stage 2 (mild): Secondary | ICD-10-CM | POA: Diagnosis present

## 2023-08-27 DIAGNOSIS — I700 Atherosclerosis of aorta: Secondary | ICD-10-CM | POA: Insufficient documentation

## 2023-08-27 NOTE — Patient Instructions (Signed)
 Medication Instructions:  Your physician has recommended you make the following change in your medication:  1.) restart Toprol  XL 12.5 mg - take daily at bedtime *If you need a refill on your cardiac medications before your next appointment, please call your pharmacy*  Lab Work: none If you have labs (blood work) drawn today and your tests are completely normal, you will receive your results only by: MyChart Message (if you have MyChart) OR A paper copy in the mail If you have any lab test that is abnormal or we need to change your treatment, we will call you to review the results.  Testing/Procedures: none  Follow-Up: 6 months - see below

## 2023-09-03 ENCOUNTER — Encounter: Payer: Self-pay | Admitting: Physician Assistant

## 2023-09-03 ENCOUNTER — Ambulatory Visit (INDEPENDENT_AMBULATORY_CARE_PROVIDER_SITE_OTHER): Admitting: Physician Assistant

## 2023-09-03 VITALS — BP 126/70 | HR 67 | Temp 97.5°F | Ht 65.0 in | Wt 158.0 lb

## 2023-09-03 DIAGNOSIS — I25118 Atherosclerotic heart disease of native coronary artery with other forms of angina pectoris: Secondary | ICD-10-CM | POA: Diagnosis not present

## 2023-09-03 DIAGNOSIS — I5022 Chronic systolic (congestive) heart failure: Secondary | ICD-10-CM

## 2023-09-03 DIAGNOSIS — M109 Gout, unspecified: Secondary | ICD-10-CM

## 2023-09-03 LAB — CBC WITH DIFFERENTIAL/PLATELET
Basophils Absolute: 0 10*3/uL (ref 0.0–0.1)
Basophils Relative: 0.8 % (ref 0.0–3.0)
Eosinophils Absolute: 0.2 10*3/uL (ref 0.0–0.7)
Eosinophils Relative: 3.7 % (ref 0.0–5.0)
HCT: 41.9 % (ref 39.0–52.0)
Hemoglobin: 14 g/dL (ref 13.0–17.0)
Lymphocytes Relative: 29.1 % (ref 12.0–46.0)
Lymphs Abs: 1.7 10*3/uL (ref 0.7–4.0)
MCHC: 33.3 g/dL (ref 30.0–36.0)
MCV: 98.4 fl (ref 78.0–100.0)
Monocytes Absolute: 0.5 10*3/uL (ref 0.1–1.0)
Monocytes Relative: 8.1 % (ref 3.0–12.0)
Neutro Abs: 3.4 10*3/uL (ref 1.4–7.7)
Neutrophils Relative %: 58.3 % (ref 43.0–77.0)
Platelets: 242 10*3/uL (ref 150.0–400.0)
RBC: 4.26 Mil/uL (ref 4.22–5.81)
RDW: 13.2 % (ref 11.5–15.5)
WBC: 5.8 10*3/uL (ref 4.0–10.5)

## 2023-09-03 LAB — COMPREHENSIVE METABOLIC PANEL WITH GFR
ALT: 32 U/L (ref 0–53)
AST: 24 U/L (ref 0–37)
Albumin: 4.5 g/dL (ref 3.5–5.2)
Alkaline Phosphatase: 118 U/L — ABNORMAL HIGH (ref 39–117)
BUN: 24 mg/dL — ABNORMAL HIGH (ref 6–23)
CO2: 26 meq/L (ref 19–32)
Calcium: 9.3 mg/dL (ref 8.4–10.5)
Chloride: 106 meq/L (ref 96–112)
Creatinine, Ser: 1.1 mg/dL (ref 0.40–1.50)
GFR: 67.75 mL/min (ref >60.00)
Glucose, Bld: 105 mg/dL — ABNORMAL HIGH (ref 70–99)
Potassium: 4 meq/L (ref 3.5–5.1)
Sodium: 139 meq/L (ref 135–145)
Total Bilirubin: 0.5 mg/dL (ref 0.2–1.2)
Total Protein: 7.2 g/dL (ref 6.0–8.3)

## 2023-09-03 LAB — URIC ACID: Uric Acid, Serum: 3.8 mg/dL — ABNORMAL LOW (ref 4.0–7.8)

## 2023-09-03 NOTE — Progress Notes (Signed)
 Subjective:    Kurt Reilly is a 73 y.o. male and is here for a yearly review of chronic medical concerns.  HPI - Pt is accompanied by his wife.  There are no preventive care reminders to display for this patient.   Acute Concerns: None.  Chronic Issues: CAD Currently closely followed by cardiology, Dr Wendel Solo on aspirin , Plavix , praluent   Gout Pt is on Allopurinol  300 mg daily and Colchicine  0.6 mg for flares. Good compliance and tolerance with Allopurinol  and states he has not had to take Colchicine  recently. Gout is well-managed. No acute concerns reported today.  Chronic CHF Continues on entresto , has restarted toprol  12.5 mg daily per their recommendation Pt endorses checking his blood pressure at home a couple times per day. He reports his most recent at home reading was 110/58. No acute concerns reported today.  Health Maintenance: Immunizations -- UTD Colonoscopy -- UTD, Cologuard last done 07/06/2023 and results were normal. 3 year repeat recommended, next due 07/06/2026. PSA -- continues to see [facility] yearly  Lab Results  Component Value Date   PSA 0.09 (L) 05/23/2021   PSA 0.46 02/06/2020   Diet -- Overall healthy diet. Sleep habits -- Good sleeping habits reported. Exercise -- Walks on the treadmill for an hour daily.  Weight --  Recent weight history Wt Readings from Last 10 Encounters:  09/03/23 158 lb (71.7 kg)  08/27/23 156 lb (70.8 kg)  06/14/23 159 lb 6.4 oz (72.3 kg)  05/10/23 156 lb (70.8 kg)  11/10/22 156 lb 6.4 oz (70.9 kg)  09/20/22 157 lb 4 oz (71.3 kg)  09/15/22 154 lb (69.9 kg)  06/06/22 161 lb (73 kg)  05/31/22 161 lb 3.2 oz (73.1 kg)  05/24/22 159 lb 6.4 oz (72.3 kg)   Body mass index is 26.29 kg/m.  Mood -- Stable. Alcohol use --  reports current alcohol use of about 1.0 standard drink of alcohol per week.  Tobacco use --  Tobacco Use: Low Risk  (09/03/2023)   Patient History    Smoking Tobacco Use: Never     Smokeless Tobacco Use: Never    Passive Exposure: Not on file    Eligible for Low Dose CT? no  UTD with eye doctor? UTD UTD with dentist? UTD UTD with dermatology? UTD     09/03/2023   10:26 AM  Depression screen PHQ 2/9  Decreased Interest 0  Down, Depressed, Hopeless 0  PHQ - 2 Score 0    Other providers/specialists: Patient Care Team: Job Lukes, [name] as PCP - General (Physician Assistant) Thukkani, Arun K, MD as PCP - Cardiology (Cardiology) Dow Maxwell, PT as Physical Therapist (Physical Therapy) Watt Rush, MD as Attending Physician (Urology) Patrcia Cough, MD as Consulting Physician (Radiation Oncology) Crawford, Morna Pickle, NP as Nurse Practitioner (Hematology and Oncology) Starla Wendelyn BIRCH, RN as Registered Nurse Nicholaus, Sherlean CROME, [facility] (Inactive) as Pharmacist (Pharmacist)    PMHx, SurgHx, SocialHx, Medications, and Allergies were reviewed in the Visit Navigator and updated as appropriate.   Past Medical History:  Diagnosis Date   Actinic keratosis    Alkaline phosphatase raised    Anemia    Blood transfusion without reported diagnosis 2016   Cardiac arrhythmia    Cardiomyopathy [facility])    CHF (congestive heart failure) (HCC)    Coronary arteriosclerosis    ED (erectile dysfunction)    Gout 03/09/2017   Hemangioma 08/24/2018   Hyperlipidemia    Hypertension    Hypertrophic scar    Malignant  neoplasm of prostate (HCC) 2013   Was told that less than 1% of cancer   Melanocytic nevi of trunk    Melanocytic nevus of skin    Senile hyperkeratosis    STEMI (ST elevation myocardial infarction) (HCC) 2016     Past Surgical History:  Procedure Laterality Date   ICD IMPLANT     LEFT HEART CATH AND CORONARY ANGIOGRAPHY N/A 01/18/2022   Procedure: LEFT HEART CATH AND CORONARY ANGIOGRAPHY;  Surgeon: Claudene Victory ORN, MD;  Location: [facility] INVASIVE CV LAB;  Service: Cardiovascular;  Laterality: N/A;   PROSTATECTOMY  2013   SUBQ ICD CHANGEOUT N/A  06/07/2020   Procedure: SUBQ ICD CHANGEOUT;  Surgeon: Waddell Danelle ORN, MD;  Location: [facility] INVASIVE CV LAB;  Service: Cardiovascular;  Laterality: N/A;     Family History  Problem Relation Age of Onset   Hypertension Father    High Cholesterol Father    Stomach cancer Father    Bladder Cancer Brother    Diabetes Brother    Kassie Parkinson White syndrome Son    Migraines Son    Prostate cancer Neg Hx    Breast cancer Neg Hx    Colon cancer Neg Hx    Pancreatic cancer Neg Hx     Social History   Tobacco Use   Smoking status: Never   Smokeless tobacco: Never  Vaping Use   Vaping status: Never Used  Substance Use Topics   Alcohol use: Yes    Alcohol/week: 1.0 standard drink of alcohol    Types: 1 Glasses of wine per week    Comment: 1 glasses of wine per week   Drug use: Never    Review of Systems:   Review of Systems  Constitutional:  Negative for chills, fever, malaise/fatigue and weight loss.  HENT:  Negative for hearing loss, sinus pain and sore throat.   Respiratory:  Negative for cough and hemoptysis.   Cardiovascular:  Negative for chest pain, palpitations, leg swelling and PND.  Gastrointestinal:  Negative for abdominal pain, constipation, diarrhea, heartburn, nausea and vomiting.  Genitourinary:  Negative for dysuria, frequency and urgency.  Musculoskeletal:  Negative for back pain, myalgias and neck pain.  Skin:  Negative for itching and rash.  Neurological:  Negative for dizziness, tingling, seizures and headaches.  Endo/Heme/Allergies:  Negative for polydipsia.  Psychiatric/Behavioral:  Negative for depression. The patient is not nervous/anxious.      Objective:    Vitals:   09/03/23 1026  BP: 126/70  Pulse: 67  Temp: (!) 97.5 F (36.4 C)  SpO2: 98%    Body mass index is 26.29 kg/m.  General  Alert, cooperative, no distress, appears stated age  Head:  Normocephalic, without obvious abnormality, atraumatic  Eyes:  PERRL, conjunctiva/corneas  clear, EOM's intact, fundi benign, both eyes       Ears:  Normal TM's and external ear canals, both ears  Nose: Nares normal, septum midline, mucosa normal, no drainage or sinus tenderness  Throat: Lips, mucosa, and tongue normal; teeth and gums normal  Neck: Supple, symmetrical, trachea midline, no adenopathy;     thyroid:  No enlargement/tenderness/nodules; no carotid bruit or JVD  Back:   Symmetric, no curvature, ROM normal, no CVA tenderness  Lungs:   Clear to auscultation bilaterally, respirations unlabored  Chest wall:  No tenderness or deformity  Heart:  Regular rate and rhythm, S1 and S2 normal, no murmur, rub or gallop  Abdomen:   Soft, non-tender, bowel sounds active all four quadrants, no  masses, no organomegaly  Extremities: Extremities normal, atraumatic, no cyanosis or edema  Prostate : Deferred   Skin: Skin color, texture, turgor normal, no rashes or lesions  Lymph nodes: Cervical, supraclavicular, and axillary nodes normal  Neurologic: CNII-XII grossly intact. Normal strength, sensation and reflexes throughout   AssessmentPlan:   Gout, unspecified cause, unspecified chronicity, unspecified site Well controlled Update uric acid level and adjust allopurinol  300 mg daily accordingly Follow up yearly, sooner if concerns  Coronary artery disease of native artery of native heart with stable angina pectoris (HCC) Most recent cardiology note reviewed Continue ongoing close follow up with cardiology Continue:  aspirin , Plavix , praluent   Chronic systolic heart failure (HCC) Most recent cardiology note reviewed Continue ongoing close follow up with cardiology   I, Lavern Simmers, acting as a Neurosurgeon for [employer], [name]., have documented all relevant documentation on the behalf of Lucie Buttner, [name], as directed by Lucie Buttner, PA while in the presence of Lucie Buttner, [name].  I, Lucie Buttner, [name], have reviewed all documentation for this visit. The documentation on  09/03/23 for the exam, diagnosis, procedures, and orders are all accurate and complete.  Lucie Buttner, PA-C [facility] [place] [facility]

## 2023-09-03 NOTE — Patient Instructions (Signed)
 It was great to see you!  Please go to the lab for blood work.   Our office will call you with your results unless you have chosen to receive results via MyChart.  If your blood work is normal we will follow-up each year for physicals and as scheduled for chronic medical problems.  If anything is abnormal we will treat accordingly and get you in for a follow-up.  Take care,  Lelon Mast

## 2023-09-04 ENCOUNTER — Other Ambulatory Visit: Payer: Self-pay | Admitting: Physician Assistant

## 2023-09-04 ENCOUNTER — Ambulatory Visit: Payer: Self-pay | Admitting: Physician Assistant

## 2023-09-04 DIAGNOSIS — M109 Gout, unspecified: Secondary | ICD-10-CM

## 2023-09-11 NOTE — Progress Notes (Signed)
 Remote ICD transmission.

## 2023-09-11 NOTE — Addendum Note (Signed)
 Addended by: TAWNI DRILLING D on: 09/11/2023 09:32 AM   Modules accepted: Orders

## 2023-09-25 ENCOUNTER — Other Ambulatory Visit: Payer: Self-pay | Admitting: Internal Medicine

## 2023-10-22 ENCOUNTER — Ambulatory Visit (INDEPENDENT_AMBULATORY_CARE_PROVIDER_SITE_OTHER): Payer: Medicare Other

## 2023-10-22 DIAGNOSIS — I255 Ischemic cardiomyopathy: Secondary | ICD-10-CM

## 2023-10-24 LAB — CUP PACEART REMOTE DEVICE CHECK
Battery Remaining Percentage: 63 %
Date Time Interrogation Session: 20250818092200
HighPow Impedance: 80 Ohm
Implantable Lead Connection Status: 753985
Implantable Lead Implant Date: 20170727
Implantable Lead Location: 753862
Implantable Lead Model: 3401
Implantable Pulse Generator Implant Date: 20220404
Pulse Gen Serial Number: 156707

## 2023-10-28 ENCOUNTER — Ambulatory Visit: Payer: Self-pay | Admitting: Internal Medicine

## 2023-10-31 ENCOUNTER — Encounter: Payer: Self-pay | Admitting: Internal Medicine

## 2023-10-31 DIAGNOSIS — R002 Palpitations: Secondary | ICD-10-CM

## 2023-11-01 ENCOUNTER — Ambulatory Visit: Attending: Internal Medicine

## 2023-11-01 DIAGNOSIS — R002 Palpitations: Secondary | ICD-10-CM

## 2023-11-01 NOTE — Progress Notes (Unsigned)
 Enrolled patient for a 3 day Zio XT monitor to be mailed to patients home

## 2023-11-06 ENCOUNTER — Telehealth: Payer: Self-pay | Admitting: Internal Medicine

## 2023-11-06 ENCOUNTER — Other Ambulatory Visit: Payer: Self-pay | Admitting: Physician Assistant

## 2023-11-06 DIAGNOSIS — G8929 Other chronic pain: Secondary | ICD-10-CM

## 2023-11-06 NOTE — Telephone Encounter (Signed)
 Patient is calling about his Mychart messages he is confused if he should be using the monitor he was sent or just let the defib monitor him.  Please advise.

## 2023-11-06 NOTE — Telephone Encounter (Signed)
 Spoke with pt who reports he has been having palpitations.  Pt advised he should use the Zio monitor that was mailed to him.  Dr Wendel has requested he wear the monitor for 3 days.  Pt verbalizes understanding and agrees with current plan.

## 2023-11-16 ENCOUNTER — Ambulatory Visit: Payer: Self-pay | Admitting: Internal Medicine

## 2023-11-16 DIAGNOSIS — R002 Palpitations: Secondary | ICD-10-CM | POA: Diagnosis not present

## 2023-11-19 ENCOUNTER — Other Ambulatory Visit: Payer: Self-pay | Admitting: Physician Assistant

## 2023-11-19 ENCOUNTER — Telehealth: Payer: Self-pay | Admitting: *Deleted

## 2023-11-19 ENCOUNTER — Encounter: Payer: Self-pay | Admitting: Internal Medicine

## 2023-11-19 NOTE — Telephone Encounter (Signed)
 Pt called in asking if taking torsemide  will lower his blood pressure too much, please advise, he prefers a c/b back to discuss.

## 2023-11-19 NOTE — Telephone Encounter (Signed)
 Separate thread has been documented in. Message sent to scheduling to get pt's 12/8 appt moved up.

## 2023-11-19 NOTE — Telephone Encounter (Signed)
 Copied from CRM [id number]. Topic: Clinical - Medical Advice >> Nov 19, 2023  3:14 PM Lauren C wrote: Reason for CRM: Pt has a lab order for uric acid, but he is unsure of when he needs to get the test done. I was unable to find specific instructions in encounter details. Pt is requesting a call back to know when he needs to get his bloodwork done Ey#[phone]

## 2023-11-19 NOTE — Telephone Encounter (Signed)
 Error

## 2023-11-19 NOTE — Telephone Encounter (Signed)
 Kurt Reilly, when is patient suppose to have Uric Acid checked.

## 2023-11-20 NOTE — Telephone Encounter (Signed)
 Spoke to pt told him he can have his Uric Acid rechecked anytime. Asked pt if he would like to schedule lab appt. Pt said yes, anything this week? Appt scheduled for tomorrow 9/17 at 10:00. Pt verbalized understanding.

## 2023-11-21 ENCOUNTER — Other Ambulatory Visit (INDEPENDENT_AMBULATORY_CARE_PROVIDER_SITE_OTHER)

## 2023-11-21 ENCOUNTER — Ambulatory Visit: Payer: Self-pay | Admitting: Physician Assistant

## 2023-11-21 DIAGNOSIS — M109 Gout, unspecified: Secondary | ICD-10-CM | POA: Diagnosis not present

## 2023-11-21 LAB — URIC ACID: Uric Acid, Serum: 4.8 mg/dL (ref 4.0–7.8)

## 2023-11-21 NOTE — Progress Notes (Unsigned)
 Cardiology Office Note:   Date:  11/22/2023  ID:  Kurt Reilly, DOB [birth year], MRN [id number] PCP:  Job Lukes, PA  [facility] Providers Cardiologist:  Wendel Haws, MD Referring MD: Job Lukes, [name]  Chief Complaint/Reason for Referral: Follow-up for coronary artery disease, ischemic cardiomyopathy ASSESSMENT:    1. Coronary artery disease of native artery of native heart with stable angina pectoris (HCC)   2. Chronic combined systolic and diastolic heart failure (HCC)   3. LV (left ventricular) mural thrombus   4. Hyperlipidemia LDL goal <70   5. Aortic atherosclerosis (HCC)   6. Essential hypertension   7. CKD (chronic kidney disease) stage 2, GFR 60-89 ml/min       PLAN:   In order of problems listed above: Coronary artery disease disease: Continue aspirin  81 mg, Plavix  75 mg, Praluent , and as needed nitroglycerin .   Combined systolic and diastolic heart failure: Continue Entresto  24 x 26 mm twice daily.  He has had low blood pressures in the past and has been unable to tolerate Toprol  even at bedtime and spironolactone  at bedtime.  I suspect he has developed fluid overload.  We will trial Lasix  40 mg once a day with potassium supplementation.  The patient will let me know in a few days if this is helping.  If it is we will then refill his prescription and check a BMP.  If he has low blood pressures I will consider stopping his Entresto  and starting losartan at bedtime.  We will check an echocardiogram with contrast as well.  If Lasix  trial does not work we will consider right heart catheterization LV thrombus: Serial echogram's have demonstrated no recurrence of LV thrombus.  Continue aspirin  81 mg and Plavix  75 mg.   Hyperlipidemia: Lipid panel at goal recently; continue Praluent  150 mg every 2 weeks. Aortic atherosclerosis: Continue aspirin  81 mg and Praluent  150 mg every 2 weeks. Hypertension: Continue Entresto  24 x 26 twice daily  CKD stage II: Continue Entresto  24  x 26 mg and Jardiance  10 mg for renal protection.              Dispo:  Return in about 3 months (around 02/21/2024).      Medication Adjustments/Labs and Tests Ordered: Current medicines are reviewed at length with the patient today.  Concerns regarding medicines are outlined above.  The following changes have been made:  no change   Labs/tests ordered: Orders Placed This Encounter  Procedures   ECHOCARDIOGRAM COMPLETE    Medication Changes: Meds ordered this encounter  Medications   nitroGLYCERIN  (NITROSTAT ) 0.4 MG SL tablet    Sig: Place 1 tablet (0.4 mg total) under the tongue every 5 (five) minutes as needed for chest pain.    Dispense:  30 tablet    Refill:  1   furosemide  (LASIX ) 40 MG tablet    Sig: Take 1 tablet (40 mg total) by mouth daily.    Dispense:  7 tablet    Refill:  0    Current medicines are reviewed at length with the patient today.  The patient does not have concerns regarding medicines.  I spent 35 minutes reviewing all clinical data during and prior to this visit including all relevant imaging studies, laboratories, clinical information from other health systems and prior notes from both Cardiology and other specialties, interviewing the patient, conducting a complete physical examination, and coordinating care in order to formulate a comprehensive and personalized evaluation and treatment plan.   History of Present Illness:  FOCUSED PROBLEM LIST:   CAD PCI of LAD and bifurcation OM left circumflex remotely LAD CTO, left circumflex CTO 2023 Heart team meeting 2023 >>> medical therapy ICM status post ICD EF 30 to 35%, severe anterior akinesis moderate MR grade 2 diastolic dysfunction TTE 01/2023 LV thrombus LV thrombus TTE April 2024 >> Eliquis  initiated Shared decision making >> trial off anticoagulation No LV thrombus TTE August 2024 while off anticoagulation No LV thrombus TTE November 2024 off anticoagulation Hyperlipidemia LP(a)  52.8 Aortic atherosclerosis Hypertension CKD stage II Palpitations Rare VT, SVT, PACs, and PVCs monitor September 2025  March 2024:  The patient is a 73 y.o. male with the indicated medical history here for cardiology follow up.  He was seen in December in the cardiology clinic.  At that point in time he was feeling well walking on a treadmill daily for about 45 minutes.  He endorses chest pain when he walks up an incline.   The patient is here today with his wife.  When he exerts himself in the yard such as raking leaves or uses the treadmill going up an incline he does develop angina.  This remits with rest.  He does not have any pain at rest.  He did go to [place]  recently and when he was down there after the flight he did notice some ankle edema for a day or 2.  This has not recurred.  He fortunately is not required any emergency room visits or hospitalizations.  He denies any presyncope or syncope.  He has had no severe bleeding or bruising.  He is otherwise well and tolerating his medications without significant issues.  Plan: Convert Coreg  to Toprol -XL 12.5 mg daily, increase Jardiance  to 25 mg, and obtain echocardiogram.   September 2024: The patient's Toprol  XL could not be tolerated due to low blood pressures.  He was taking this in the morning.  He was converted back to Coreg  6.25 twice daily.  The patient had an echocardiogram in April which showed possible LV thrombus.  His aspirin  and Plavix  was stopped and Eliquis  5 mg twice daily was started.  A repeat echocardiogram in August demonstrated resolution of the thrombus.  His Eliquis  was stopped and aspirin  and Plavix  restarted with a limited echocardiogram planned for 3 months with [facility]  to evaluate further.  His ICD was interrogated recently and was reassuring.   The patient is here with his wife today.  He is doing relatively well.  He is able to mow his grass without any issues with angina or dyspnea.  He has noticed some fatigue in  the afternoon.  He did bring his blood pressure log in with him and his blood pressures around this time I usually relatively lower than his morning and evening blood pressures at up about 103 207 systolic.  He denies any peripheral edema, paroxysmal nocturnal dyspnea, ICD shocks, or any need for hospitalization or emergency room visit for cardiovascular issues.  He has been tolerating Praluent  well.  He has not required any nitroglycerin .  Plan: Repeat echocardiogram with Definity  while on dual antiplatelet therapy, check lipid panel, change Coreg  6.25 mg twice daily to Toprol  12.5 mg at bedtime.  March 2024: Patient consents to use of AI scribe. In the interim the patient's LDL was 62.  Repeat echocardiogram in November with [facility]  demonstrated no LV thrombus.  He experiences chest pain during exertion, particularly when using the treadmill. The pain is inducible by exertion, which he attributes to his blocked artery on the  front wall of the heart. He has not used nitroglycerin  before exercising. No chest pain at rest and no shortness of breath.  He is currently on aspirin  and Plavix  without significant bleeding issues, such as epistaxis or new bruises. He takes metoprolol  at 6:30 AM and spironolactone  three times a week in the morning. He experiences occasional lightheadedness, especially when changing positions quickly, which he attributes to his medications. He is not on statins but takes Praluent  for cholesterol management. He also takes a B vitamin supplement but is unsure about its necessity.  His blood pressure at home averages 114/55, but he feels low energy in the afternoons. Occasional lightheadedness is noted, but there are no blacking out spells or swelling in the legs.  He is on Jardiance , which causes frequent urination, and he finds it bothersome. He takes it in the morning and reports needing to use the bathroom frequently, including at night.  He experiences muscle pain without  cramping and has been advised by a chiropractor to take magnesium.  He is planning a trip to [place] and [place] next year and is concerned about the possibility of blood clots during travel. He has had two echocardiograms in the past that showed no clots, which he finds reassuring.  Plan change spironolactone  to bedtime dosing due to low blood pressures, obtain contrast echocardiogram to evaluate for LV thrombus, check lipids and LFTs.  Take as needed nitroglycerin  prior to exercise.  June 2025:  Patient consents to use of AI scribe. His lipid panel was found to be at goal.  Echocardiogram demonstrated unchanged ventricular function with no recurrence of LV thrombus.  He was seen in the emergency department recently for wheezing.  He did not report any chest pain or shortness of breath.  His proBNP was elevated.  His chest x-ray showed no acute abnormalities and his EKG was reassuring.  He was given 1 dose of torsemide  and discharged home.  He called our office because his blood pressures was low.  His Entresto , metoprolol , and spironolactone  were held.  His blood pressure improved over the next few days and Entresto  low-dose was reinitiated.  He is here for follow-up.  He has been on Entresto  since the Friday prior to the visit, which has helped lower his blood pressure from the 130s to an average of 110/60 mmHg. He reports no lightheadedness but notes an increase in his pulse rate to the high 60s since discontinuing metoprolol . He is not currently taking torsemide  due to concerns about low blood pressure.  He mentions a history of flash pulmonary edema nine years ago and is concerned about the possibility of recurrence. He has not experienced significant swelling in his ankles but notes some weight loss, currently weighing around 152 pounds.  He has a history of low heart rate episodes, with occasional readings dropping to the 30s, which occurred while on metoprolol . Since discontinuing metoprolol , these  episodes have not recurred. He is cautious about resuming metoprolol  due to previous issues with low heart rate and blood pressure.  He has increased his fluid intake to 2200-2500 mL per day. He monitors his weight regularly and has not needed to take torsemide  recently.  No chest pain, bleeding, swelling, or significant shortness of breath. Reports occasional wheezing and increased pulse rate.  Plan: Restart Toprol  12.5 mg at bedtime.  September 2025:  Patient consents to use of AI scribe. The patient returns for routine follow-up.  In the interim he reported palpitations.  This demonstrated occasional PVCs.  He  is here for expedited follow-up.  He experiences a 'rattling sensation' in his chest, particularly noticeable when lying flat at night, which has been progressively worsening. Elevating his head with pillows alleviates the sensation. No significant weight gain, with fluctuations of only a quarter pound, and low blood pressure readings at night, around 106/56 mmHg.  He describes episodes of shortness of breath, particularly when climbing stairs, which he previously managed without difficulty. He also reports waking up with his heart 'pounding like I just ran a marathon,' which subsides with deep breathing exercises. He has a history of extra heartbeats noted on a heart monitor and has a defibrillator in place.  He mentions feeling full more quickly when eating. He has not taken Lasix  since his heart attack many years ago. He is concerned about the potential side effects of diuretics, including dizziness and low blood pressure, and notes that he sometimes appears 'gray' with cold extremities, which he associates with low blood pressure.          Current Medications: Current Meds  Medication Sig   Alirocumab  (PRALUENT ) 150 MG/ML SOAJ INJECT 1 ML INTO THE SKIN EVERY 14 (FOURTEEN) DAYS.   allopurinol  (ZYLOPRIM ) 300 MG tablet TAKE 1 TABLET DAILY FOR GOUT (NEED APPOINTMENT)   aspirin  EC 81  MG tablet Take 1 tablet (81 mg total) by mouth daily. Swallow whole.   B Complex-C (VITAMIN B + C COMPLEX PO)    Cholecalciferol (VITAMIN D-3) 25 MCG (1000 UT) CAPS Take by mouth daily. Per patient taking 1,000 units daily   clopidogrel  (PLAVIX ) 75 MG tablet Take 1 tablet (75 mg total) by mouth daily.   colchicine  0.6 MG tablet TAKE 2 TABS ORALLY AT FIRST SIGN OF FLARE & 1 TAB 1 HR LATER. THEN 1 TAB DAILY UNTIL FLARE RESOLVED   ferrous sulfate 325 (65 FE) MG tablet Take 325 mg by mouth daily with breakfast.   fluticasone (FLONASE) 50 MCG/ACT nasal spray Place 1 spray into both nostrils daily as needed for allergies or rhinitis.   furosemide  (LASIX ) 40 MG tablet Take 1 tablet (40 mg total) by mouth daily.   JARDIANCE  10 MG TABS tablet TAKE 1 TABLET DAILY BEFORE BREAKFAST (DOSE DECREASE)   metoprolol  succinate (TOPROL  XL) 25 MG 24 hr tablet Take 0.5 tablets (12.5 mg total) by mouth daily.   MILK THISTLE PO Take 500 mg by mouth daily.   potassium chloride  SA (KLOR-CON  M20) 20 MEQ tablet TAKE 1 TABLET BY MOUTH ONLY WHEN YOU TAKE THE TORSEMIDE  (Patient taking differently: Take 20 mEq by mouth daily as needed (when pt takes Torsemide ). TAKE 1 TABLET BY MOUTH ONLY WHEN YOU TAKE THE TORSEMIDE )   sacubitril-valsartan (ENTRESTO ) 24-26 MG TAKE 1 TABLET TWICE A DAY   [DISCONTINUED] nitroGLYCERIN  (NITROSTAT ) 0.4 MG SL tablet Place 1 tablet (0.4 mg total) under the tongue every 5 (five) minutes as needed for chest pain.   [DISCONTINUED] torsemide  (DEMADEX ) 20 MG tablet Take 1 tablet (20 mg total) by mouth daily as needed (water weight gain).     Review of Systems:   Please see the history of present illness.    All other systems reviewed and are negative.     EKGs/Labs/Other Test Reviewed:   EKG: EKG from June 2026 demonstrates sinus rhythm with trigeminy nonspecific ST and T wave changes  EKG Interpretation Date/Time:    Ventricular Rate:    PR Interval:    QRS Duration:    QT Interval:    QTC  Calculation:   R  Axis:      Text Interpretation:           Risk Assessment/Calculations:          Physical Exam:   VS:  BP 130/66 (BP Location: Left Arm, Patient Position: Sitting, Cuff Size: Normal)   Pulse 81   Ht 5' 6 (1.676 m)   Wt 148 lb 3.2 oz (67.2 kg)   SpO2 99%   BMI 23.92 kg/m        Wt Readings from Last 3 Encounters:  11/22/23 148 lb 3.2 oz (67.2 kg)  09/03/23 158 lb (71.7 kg)  08/27/23 156 lb (70.8 kg)      GENERAL:  No apparent distress, AOx3 HEENT:  No carotid bruits, +2 carotid impulses, no scleral icterus CAR: RRR no murmurs, gallops, rubs, or thrills RES:  Clear to auscultation bilaterally ABD:  Soft, nontender, nondistended, positive bowel sounds x 4 VASC:  +2 radial pulses, +2 carotid pulses NEURO:  CN 2-12 grossly intact; motor and sensory grossly intact PSYCH:  No active depression or anxiety EXT:  No edema, ecchymosis, or cyanosis  Signed, Sennie Borden K Lochlyn Zullo, MD  11/22/2023 3:47 PM    [facility] [address] [place], [place], [place]  [address] Phone: [phone]; Fax: [phone]   Note:  This document was prepared using Dragon voice recognition software and may include unintentional dictation errors.

## 2023-11-22 ENCOUNTER — Encounter: Payer: Self-pay | Admitting: Internal Medicine

## 2023-11-22 ENCOUNTER — Ambulatory Visit: Attending: Internal Medicine | Admitting: Internal Medicine

## 2023-11-22 VITALS — BP 130/66 | HR 81 | Ht 66.0 in | Wt 148.2 lb

## 2023-11-22 DIAGNOSIS — I25118 Atherosclerotic heart disease of native coronary artery with other forms of angina pectoris: Secondary | ICD-10-CM | POA: Diagnosis present

## 2023-11-22 DIAGNOSIS — N182 Chronic kidney disease, stage 2 (mild): Secondary | ICD-10-CM | POA: Insufficient documentation

## 2023-11-22 DIAGNOSIS — I10 Essential (primary) hypertension: Secondary | ICD-10-CM | POA: Diagnosis present

## 2023-11-22 DIAGNOSIS — I5042 Chronic combined systolic (congestive) and diastolic (congestive) heart failure: Secondary | ICD-10-CM | POA: Insufficient documentation

## 2023-11-22 DIAGNOSIS — I513 Intracardiac thrombosis, not elsewhere classified: Secondary | ICD-10-CM | POA: Insufficient documentation

## 2023-11-22 DIAGNOSIS — E785 Hyperlipidemia, unspecified: Secondary | ICD-10-CM | POA: Insufficient documentation

## 2023-11-22 DIAGNOSIS — I700 Atherosclerosis of aorta: Secondary | ICD-10-CM | POA: Insufficient documentation

## 2023-11-22 MED ORDER — FUROSEMIDE 40 MG PO TABS: 40.0000 mg | ORAL_TABLET | Freq: Every day | ORAL | 0 refills | Status: DC

## 2023-11-22 MED ORDER — NITROGLYCERIN 0.4 MG SL SUBL: 0.4000 mg | SUBLINGUAL_TABLET | SUBLINGUAL | 1 refills | Status: AC | PRN

## 2023-11-22 NOTE — Patient Instructions (Signed)
 Medication Instructions:  STOP Torsemide    START Furosemide  (Lasix ) 40 mg once daily for 7 days.  *If you need a refill on your cardiac medications before your next appointment, please call your pharmacy*  Lab Work: None ordered today. If you have labs (blood work) drawn today and your tests are completely normal, you will receive your results only by: MyChart Message (if you have MyChart) OR A paper copy in the mail If you have any lab test that is abnormal or we need to change your treatment, we will call you to review the results.  Testing/Procedures: Your physician has requested that you have an echocardiogram. Echocardiography is a painless test that uses sound waves to create images of your heart. It provides your doctor with information about the size and shape of your heart and how well your heart's chambers and valves are working. This procedure takes approximately one hour. There are no restrictions for this procedure. Please do NOT wear cologne, perfume, aftershave, or lotions (deodorant is allowed). Please arrive 15 minutes prior to your appointment time.  Please note: We ask at that you not bring children with you during ultrasound (echo/ vascular) testing. Due to room size and safety concerns, children are not allowed in the ultrasound rooms during exams. Our front office staff cannot provide observation of children in our lobby area while testing is being conducted. An adult accompanying a patient to their appointment will only be allowed in the ultrasound room at the discretion of the ultrasound technician under special circumstances. We apologize for any inconvenience.   Follow-Up: At [facility], you and your health needs are our priority.  As part of our continuing mission to provide you with exceptional heart care, our providers are all part of one team.  This team includes your primary Cardiologist (physician) and Advanced Practice Providers or APPs (Physician  Assistants and Nurse Practitioners) who all work together to provide you with the care you need, when you need it.  Your next appointment:   3 month(s)  Provider:   Arun Thukkani, MD

## 2023-11-27 ENCOUNTER — Encounter: Payer: Self-pay | Admitting: Internal Medicine

## 2023-11-27 DIAGNOSIS — Z79899 Other long term (current) drug therapy: Secondary | ICD-10-CM

## 2023-11-28 MED ORDER — FUROSEMIDE 20 MG PO TABS: 20.0000 mg | ORAL_TABLET | Freq: Every day | ORAL | 3 refills | Status: DC

## 2023-11-28 NOTE — Progress Notes (Signed)
Remote ICD Transmission.

## 2023-12-07 ENCOUNTER — Ambulatory Visit: Payer: Self-pay | Admitting: Internal Medicine

## 2023-12-07 DIAGNOSIS — E876 Hypokalemia: Secondary | ICD-10-CM

## 2023-12-07 LAB — BASIC METABOLIC PANEL WITH GFR
BUN/Creatinine Ratio: 21 (ref 10–24)
BUN: 25 mg/dL (ref 8–27)
CO2: 22 mmol/L (ref 20–29)
Calcium: 9.1 mg/dL (ref 8.6–10.2)
Chloride: 102 mmol/L (ref 96–106)
Creatinine, Ser: 1.18 mg/dL (ref 0.76–1.27)
Glucose: 114 mg/dL — AB (ref 70–99)
Potassium: 3.8 mmol/L (ref 3.5–5.2)
Sodium: 139 mmol/L (ref 134–144)
eGFR: 66 mL/min/1.73 (ref >59)

## 2023-12-07 MED ORDER — POTASSIUM CHLORIDE CRYS ER 20 MEQ PO TBCR: 40.0000 meq | EXTENDED_RELEASE_TABLET | Freq: Every day | ORAL | 3 refills | Status: DC

## 2023-12-10 ENCOUNTER — Encounter: Payer: Self-pay | Admitting: Internal Medicine

## 2023-12-19 LAB — BASIC METABOLIC PANEL WITH GFR
BUN/Creatinine Ratio: 23 (ref 10–24)
BUN: 27 mg/dL (ref 8–27)
CO2: 22 mmol/L (ref 20–29)
Calcium: 9.3 mg/dL (ref 8.6–10.2)
Chloride: 102 mmol/L (ref 96–106)
Creatinine, Ser: 1.19 mg/dL (ref 0.76–1.27)
Glucose: 92 mg/dL (ref 70–99)
Potassium: 3.8 mmol/L (ref 3.5–5.2)
Sodium: 140 mmol/L (ref 134–144)
eGFR: 65 mL/min/1.73 (ref >59)

## 2023-12-28 ENCOUNTER — Ambulatory Visit (HOSPITAL_COMMUNITY)
Admission: RE | Admit: 2023-12-28 | Discharge: 2023-12-28 | Disposition: A | Discharge status: Home / Self Care | Source: Ambulatory Visit | Attending: Internal Medicine | Admitting: Internal Medicine

## 2023-12-28 ENCOUNTER — Ambulatory Visit: Payer: Self-pay | Admitting: Internal Medicine

## 2023-12-28 DIAGNOSIS — I5042 Chronic combined systolic (congestive) and diastolic (congestive) heart failure: Secondary | ICD-10-CM | POA: Diagnosis present

## 2023-12-28 DIAGNOSIS — I700 Atherosclerosis of aorta: Secondary | ICD-10-CM | POA: Diagnosis present

## 2023-12-28 DIAGNOSIS — I10 Essential (primary) hypertension: Secondary | ICD-10-CM | POA: Diagnosis present

## 2023-12-28 DIAGNOSIS — I25118 Atherosclerotic heart disease of native coronary artery with other forms of angina pectoris: Secondary | ICD-10-CM | POA: Diagnosis present

## 2023-12-28 LAB — ECHOCARDIOGRAM COMPLETE
Area-P 1/2: 3.5 cm2
S' Lateral: 3.9 cm

## 2023-12-28 MED ORDER — PERFLUTREN LIPID MICROSPHERE
3.0000 mL | INTRAVENOUS | Status: AC | PRN
  Administered 2023-12-28: 3 mL via INTRAVENOUS

## 2024-01-02 ENCOUNTER — Other Ambulatory Visit: Payer: Self-pay | Admitting: Physician Assistant

## 2024-01-02 MED ORDER — COLCHICINE 0.6 MG PO TABS: ORAL_TABLET | ORAL | 1 refills | Status: DC

## 2024-01-13 ENCOUNTER — Other Ambulatory Visit: Payer: Self-pay | Admitting: Internal Medicine

## 2024-01-21 ENCOUNTER — Ambulatory Visit: Payer: Medicare Other

## 2024-01-21 DIAGNOSIS — I255 Ischemic cardiomyopathy: Secondary | ICD-10-CM | POA: Diagnosis not present

## 2024-01-23 ENCOUNTER — Ambulatory Visit: Payer: Self-pay | Admitting: Internal Medicine

## 2024-01-23 LAB — CUP PACEART REMOTE DEVICE CHECK
Battery Remaining Percentage: 60 %
Battery Voltage: 60
Date Time Interrogation Session: 20251117084400
HighPow Impedance: 100 Ohm
Implantable Lead Connection Status: 753985
Implantable Lead Implant Date: 20170727
Implantable Lead Location: 753862
Implantable Lead Model: 3401
Implantable Pulse Generator Implant Date: 20220404
Pulse Gen Serial Number: 156707

## 2024-01-24 NOTE — Progress Notes (Signed)
 Remote ICD Transmission

## 2024-01-28 ENCOUNTER — Other Ambulatory Visit: Payer: Self-pay | Admitting: Internal Medicine

## 2024-01-30 ENCOUNTER — Other Ambulatory Visit: Payer: Self-pay | Admitting: *Deleted

## 2024-01-30 MED ORDER — FUROSEMIDE 20 MG PO TABS: 20.0000 mg | ORAL_TABLET | Freq: Every day | ORAL | 3 refills | Status: DC

## 2024-01-30 MED ORDER — COLCHICINE 0.6 MG PO TABS: ORAL_TABLET | ORAL | 1 refills | Status: AC

## 2024-02-04 NOTE — Progress Notes (Unsigned)
 Cardiology Office Note:   Date:  02/11/2024  ID:  Kurt Reilly, DOB 1950/05/02, MRN [id number] PCP:  Job Lukes, PA  [facility] Providers Cardiologist:  Wendel Haws, MD Referring MD: Job Lukes, [name]  Chief Complaint/Reason for Referral: Follow-up for coronary artery disease, ischemic cardiomyopathy ASSESSMENT:    1. Coronary artery disease of native artery of native heart with stable angina pectoris   2. Chronic combined systolic and diastolic heart failure (HCC)   3. LV (left ventricular) mural thrombus   4. Hyperlipidemia LDL goal <70   5. Aortic atherosclerosis   6. Essential hypertension   7. CKD (chronic kidney disease) stage 2, GFR 60-89 ml/min        PLAN:   In order of problems listed above: Coronary artery disease disease: Continue aspirin  81 mg, Plavix  75 mg, Praluent , and as needed nitroglycerin .   Combined systolic and diastolic heart failure: Continue Entresto  24 x 26 mm twice daily.  Continue Toprol  12.5 mg at bedtime, spironolactone  12.5 mg 3 times a week.  Continue Lasix  40 mg daily LV thrombus: Serial echograms have demonstrated no recurrence of LV thrombus.  Continue aspirin  81 mg and Plavix  75 mg.   Hyperlipidemia: LDL was 60 in March 2025; continue Praluent  150 mg every 2 weeks.  Check high-sensitivity CRP for residual inflammatory risk. Aortic atherosclerosis: Continue aspirin  81 mg and Praluent  150 mg every 2 weeks. Hypertension: Continue Entresto  24 x 26 twice daily  CKD stage II: Continue Entresto  24 x 26 mg and Jardiance  10 mg for renal protection.              Dispo:  Return in about 6 months (around 08/11/2024).      Medication Adjustments/Labs and Tests Ordered: Current medicines are reviewed at length with the patient today.  Concerns regarding medicines are outlined above.  The following changes have been made:  no change   Labs/tests ordered: Orders Placed This Encounter  Procedures   CRP High sensitivity    Medication  Changes: No orders of the defined types were placed in this encounter.   Current medicines are reviewed at length with the patient today.  The patient does not have concerns regarding medicines.  I spent 37 minutes reviewing all clinical data during and prior to this visit including all relevant imaging studies, laboratories, clinical information from other health systems and prior notes from both Cardiology and other specialties, interviewing the patient, conducting a complete physical examination, and coordinating care in order to formulate a comprehensive and personalized evaluation and treatment plan.   History of Present Illness:      FOCUSED PROBLEM LIST:   CAD PCI of LAD and bifurcation OM left circumflex remotely LAD CTO, left circumflex CTO 2023 Heart team meeting 2023 >>> medical therapy ICM status post ICD EF 30 to 35%, severe anterior akinesis moderate MR grade 2 diastolic dysfunction TTE 01/2023 EF 30 to 35%, severe anterior akinesis, no mural thrombus no significant valvular issues, TTE October 2025 LV thrombus LV thrombus TTE April 2024 >> Eliquis  initiated Shared decision making >> trial off anticoagulation No LV thrombus TTE August 2024 while off anticoagulation No LV thrombus TTE November 2024 off anticoagulation Hyperlipidemia LP(a) 52.8 Aortic atherosclerosis Hypertension CKD stage II Palpitations Rare VT, SVT, PACs, and PVCs monitor September 2025  March 2024:  The patient is a 73 y.o. male with the indicated medical history here for cardiology follow up.  He was seen in December in the cardiology clinic.  At that point in time  he was feeling well walking on a treadmill daily for about 45 minutes.  He endorses chest pain when he walks up an incline.   The patient is here today with his wife.  When he exerts himself in the yard such as raking leaves or uses the treadmill going up an incline he does develop angina.  This remits with rest.  He does not have any  pain at rest.  He did go to [place]  recently and when he was down there after the flight he did notice some ankle edema for a day or 2.  This has not recurred.  He fortunately is not required any emergency room visits or hospitalizations.  He denies any presyncope or syncope.  He has had no severe bleeding or bruising.  He is otherwise well and tolerating his medications without significant issues.  Plan: Convert Coreg  to Toprol -XL 12.5 mg daily, increase Jardiance  to 25 mg, and obtain echocardiogram.   September 2024: The patient's Toprol  XL could not be tolerated due to low blood pressures.  He was taking this in the morning.  He was converted back to Coreg  6.25 twice daily.  The patient had an echocardiogram in April which showed possible LV thrombus.  His aspirin  and Plavix  was stopped and Eliquis  5 mg twice daily was started.  A repeat echocardiogram in August demonstrated resolution of the thrombus.  His Eliquis  was stopped and aspirin  and Plavix  restarted with a limited echocardiogram planned for 3 months with [facility]  to evaluate further.  His ICD was interrogated recently and was reassuring.   The patient is here with his wife today.  He is doing relatively well.  He is able to mow his grass without any issues with angina or dyspnea.  He has noticed some fatigue in the afternoon.  He did bring his blood pressure log in with him and his blood pressures around this time I usually relatively lower than his morning and evening blood pressures at up about 103 207 systolic.  He denies any peripheral edema, paroxysmal nocturnal dyspnea, ICD shocks, or any need for hospitalization or emergency room visit for cardiovascular issues.  He has been tolerating Praluent  well.  He has not required any nitroglycerin .  Plan: Repeat echocardiogram with Definity  while on dual antiplatelet therapy, check lipid panel, change Coreg  6.25 mg twice daily to Toprol  12.5 mg at bedtime.  March 2024: Patient consents to use of AI  scribe. In the interim the patient's LDL was 62.  Repeat echocardiogram in November with Def[facility]  demonstrated no LV thrombus.  He experiences chest pain during exertion, particularly when using the treadmill. The pain is inducible by exertion, which he attributes to his blocked artery on the front wall of the heart. He has not used nitroglycerin  before exercising. No chest pain at rest and no shortness of breath.  He is currently on aspirin  and Plavix  without significant bleeding issues, such as epistaxis or new bruises. He takes metoprolol  at 6:30 AM and spironolactone  three times a week in the morning. He experiences occasional lightheadedness, especially when changing positions quickly, which he attributes to his medications. He is not on statins but takes Praluent  for cholesterol management. He also takes a B vitamin supplement but is unsure about its necessity.  His blood pressure at home averages 114/55, but he feels low energy in the afternoons. Occasional lightheadedness is noted, but there are no blacking out spells or swelling in the legs.  He is on Jardiance , which causes frequent urination, and he  finds it bothersome. He takes it in the morning and reports needing to use the bathroom frequently, including at night.  He experiences muscle pain without cramping and has been advised by a chiropractor to take magnesium.  He is planning a trip to [place] and [place] next year and is concerned about the possibility of blood clots during travel. He has had two echocardiograms in the past that showed no clots, which he finds reassuring.  Plan change spironolactone  to bedtime dosing due to low blood pressures, obtain contrast echocardiogram to evaluate for LV thrombus, check lipids and LFTs.  Take as needed nitroglycerin  prior to exercise.  June 2025:  Patient consents to use of AI scribe. His lipid panel was found to be at goal.  Echocardiogram demonstrated unchanged ventricular function with no  recurrence of LV thrombus.  He was seen in the emergency department recently for wheezing.  He did not report any chest pain or shortness of breath.  His proBNP was elevated.  His chest x-ray showed no acute abnormalities and his EKG was reassuring.  He was given 1 dose of torsemide  and discharged home.  He called our office because his blood pressures was low.  His Entresto , metoprolol , and spironolactone  were held.  His blood pressure improved over the next few days and Entresto  low-dose was reinitiated.  He is here for follow-up.  He has been on Entresto  since the Friday prior to the visit, which has helped lower his blood pressure from the 130s to an average of 110/60 mmHg. He reports no lightheadedness but notes an increase in his pulse rate to the high 60s since discontinuing metoprolol . He is not currently taking torsemide  due to concerns about low blood pressure.  He mentions a history of flash pulmonary edema nine years ago and is concerned about the possibility of recurrence. He has not experienced significant swelling in his ankles but notes some weight loss, currently weighing around 152 pounds.  He has a history of low heart rate episodes, with occasional readings dropping to the 30s, which occurred while on metoprolol . Since discontinuing metoprolol , these episodes have not recurred. He is cautious about resuming metoprolol  due to previous issues with low heart rate and blood pressure.  He has increased his fluid intake to 2200-2500 mL per day. He monitors his weight regularly and has not needed to take torsemide  recently.  No chest pain, bleeding, swelling, or significant shortness of breath. Reports occasional wheezing and increased pulse rate.  Plan: Restart Toprol  12.5 mg at bedtime.  September 2025:  Patient consents to use of AI scribe. The patient returns for routine follow-up.  In the interim he reported palpitations.  This demonstrated occasional PVCs.  He is here for expedited  follow-up.  He experiences a 'rattling sensation' in his chest, particularly noticeable when lying flat at night, which has been progressively worsening. Elevating his head with pillows alleviates the sensation. No significant weight gain, with fluctuations of only a quarter pound, and low blood pressure readings at night, around 106/56 mmHg.  He describes episodes of shortness of breath, particularly when climbing stairs, which he previously managed without difficulty. He also reports waking up with his heart 'pounding like I just ran a marathon,' which subsides with deep breathing exercises. He has a history of extra heartbeats noted on a heart monitor and has a defibrillator in place.  He mentions feeling full more quickly when eating. He has not taken Lasix  since his heart attack many years ago. He is concerned about  the potential side effects of diuretics, including dizziness and low blood pressure, and notes that he sometimes appears 'gray' with cold extremities, which he associates with low blood pressure.  Plan: Start Lasix  40 mg daily and potassium 40 mill equivalents daily.  Obtain device interrogation and echocardiogram  December 2025:  Patient consents to use of AI scribe. The patient returns for routine follow-up.  His device interrogation demonstrated no arrhythmias.  His echocardiogram demonstrated unchanged LV function with no evidence of LV thrombus.  Since starting Lasix , he has not experienced wheezing or crackling in his lungs and notes improvement in his ability to perform activities such as yard work without significant shortness of breath. He does not experience swelling in his legs and maintains a stable weight around 151-152 pounds, which he identifies as his 'dry weight'.  He has a history of coronary artery disease with blocked arteries on the front wall of the heart and is currently on Entresto . He describes a sensation of his heart feeling different when he temporarily stopped  Entresto , but felt normal upon resuming it. He has had issues with increasing medication doses in the past. He does not experience shocks from his defibrillator.  He has a history of a heart attack and uses a wedge pillow to sleep due to nausea when lying flat. He has reduced the angle of the wedge pillow slightly and finds it comfortable. No recent increase in shortness of breath or need to adjust the wedge pillow further.  He monitors his blood pressure daily, which typically ranges from 105-110/50-55 mmHg. Occasionally, he experiences brief dizziness, occurring every four to five months, which he attributes to low blood pressure or possibly not eating enough.  He has stage two kidney disease. His liver function was last checked in June; alkaline phosphatase was slightly elevated, which has been noted in the past.          Current Medications: Current Meds  Medication Sig   Alirocumab  (PRALUENT ) 150 MG/ML SOAJ INJECT 1 ML INTO THE SKIN EVERY 14 (FOURTEEN) DAYS.   allopurinol  (ZYLOPRIM ) 300 MG tablet TAKE 1 TABLET DAILY FOR GOUT (NEED APPOINTMENT)   aspirin  EC 81 MG tablet Take 1 tablet (81 mg total) by mouth daily. Swallow whole.   B Complex-C (VITAMIN B + C COMPLEX PO)    Cholecalciferol (VITAMIN D-3) 25 MCG (1000 UT) CAPS Take by mouth daily. Per patient taking 1,000 units daily   clopidogrel  (PLAVIX ) 75 MG tablet Take 1 tablet (75 mg total) by mouth daily.   colchicine  0.6 MG tablet TAKE 2 TABS BY MOUTH AT FIRST SIGN OF FLARE & 1 TAB 1HR LATER. THEN 1 TAB DAILY UNTIL FLARE RESOLVES   ferrous sulfate 325 (65 FE) MG tablet Take 325 mg by mouth daily with breakfast.   fluticasone (FLONASE) 50 MCG/ACT nasal spray Place 1 spray into both nostrils daily as needed for allergies or rhinitis.   furosemide  (LASIX ) 20 MG tablet Take 1 tablet (20 mg total) by mouth daily.   JARDIANCE  10 MG TABS tablet TAKE 1 TABLET DAILY BEFORE BREAKFAST (DOSE DECREASE)   metoprolol  succinate (TOPROL -XL) 25 MG 24  hr tablet Take 0.5 tablets (12.5 mg total) by mouth daily. (STOP CARVEDILOL )   MILK THISTLE PO Take 500 mg by mouth daily.   nitroGLYCERIN  (NITROSTAT ) 0.4 MG SL tablet Place 1 tablet (0.4 mg total) under the tongue every 5 (five) minutes as needed for chest pain.   potassium chloride  SA (KLOR-CON  M20) 20 MEQ tablet Take 2 tablets (  40 mEq total) by mouth daily.   sacubitril-valsartan (ENTRESTO ) 24-26 MG TAKE 1 TABLET TWICE A DAY   spironolactone  (ALDACTONE ) 25 MG tablet Take 0.5 tablets (12.5 mg total) by mouth 3 (three) times a week. Take on Mondays, Wednesdays, and Fridays.     Review of Systems:   Please see the history of present illness.    All other systems reviewed and are negative.     EKGs/Labs/Other Test Reviewed:   EKG: EKG from June 2026 demonstrates sinus rhythm with trigeminy nonspecific ST and T wave changes  EKG Interpretation Date/Time:    Ventricular Rate:    PR Interval:    QRS Duration:    QT Interval:    QTC Calculation:   R Axis:      Text Interpretation:           Risk Assessment/Calculations:          Physical Exam:   VS:  BP 120/70   Pulse (!) 54   Ht 5' 6 (1.676 m)   Wt 156 lb 6.4 oz (70.9 kg)   SpO2 99%   BMI 25.24 kg/m        Wt Readings from Last 3 Encounters:  02/11/24 156 lb 6.4 oz (70.9 kg)  11/22/23 148 lb 3.2 oz (67.2 kg)  09/03/23 158 lb (71.7 kg)      GENERAL:  No apparent distress, AOx3 HEENT:  No carotid bruits, +2 carotid impulses, no scleral icterus CAR: RRR no murmurs, gallops, rubs, or thrills RES:  Clear to auscultation bilaterally ABD:  Soft, nontender, nondistended, positive bowel sounds x 4 VASC:  +2 radial pulses, +2 carotid pulses NEURO:  CN 2-12 grossly intact; motor and sensory grossly intact PSYCH:  No active depression or anxiety EXT:  No edema, ecchymosis, or cyanosis  Signed, Berenice Oehlert K Gabryel Files, MD  02/11/2024 10:56 AM    [facility] [address] [place], [place], [place]   [address] Phone: [phone]; Fax: [phone]   Note:  This document was prepared using Dragon voice recognition software and may include unintentional dictation errors.

## 2024-02-05 ENCOUNTER — Other Ambulatory Visit: Payer: Self-pay | Admitting: Internal Medicine

## 2024-02-07 ENCOUNTER — Other Ambulatory Visit: Payer: Self-pay | Admitting: Internal Medicine

## 2024-02-10 ENCOUNTER — Other Ambulatory Visit: Payer: Self-pay | Admitting: Internal Medicine

## 2024-02-11 ENCOUNTER — Encounter: Payer: Self-pay | Admitting: Internal Medicine

## 2024-02-11 ENCOUNTER — Ambulatory Visit: Attending: Internal Medicine | Admitting: Internal Medicine

## 2024-02-11 ENCOUNTER — Other Ambulatory Visit: Payer: Self-pay | Admitting: Internal Medicine

## 2024-02-11 VITALS — BP 120/70 | HR 54 | Ht 66.0 in | Wt 156.4 lb

## 2024-02-11 DIAGNOSIS — N182 Chronic kidney disease, stage 2 (mild): Secondary | ICD-10-CM

## 2024-02-11 DIAGNOSIS — E785 Hyperlipidemia, unspecified: Secondary | ICD-10-CM | POA: Diagnosis not present

## 2024-02-11 DIAGNOSIS — I5042 Chronic combined systolic (congestive) and diastolic (congestive) heart failure: Secondary | ICD-10-CM | POA: Diagnosis not present

## 2024-02-11 DIAGNOSIS — I10 Essential (primary) hypertension: Secondary | ICD-10-CM | POA: Diagnosis not present

## 2024-02-11 DIAGNOSIS — I513 Intracardiac thrombosis, not elsewhere classified: Secondary | ICD-10-CM | POA: Diagnosis not present

## 2024-02-11 DIAGNOSIS — I25118 Atherosclerotic heart disease of native coronary artery with other forms of angina pectoris: Secondary | ICD-10-CM

## 2024-02-11 DIAGNOSIS — I700 Atherosclerosis of aorta: Secondary | ICD-10-CM

## 2024-02-11 MED ORDER — POTASSIUM CHLORIDE CRYS ER 20 MEQ PO TBCR: 40.0000 meq | EXTENDED_RELEASE_TABLET | Freq: Every day | ORAL | 2 refills | Status: AC

## 2024-02-11 NOTE — Patient Instructions (Signed)
 Medication Instructions:  Your physician recommends that you continue on your current medications as directed. Please refer to the Current Medication list given to you today.  *If you need a refill on your cardiac medications before your next appointment, please call your pharmacy*  Lab Work: Today: high sensitivity CRP  If you have any lab test that is abnormal or we need to change your treatment, we will call you to review the results.  Testing/Procedures: None ordered  Follow-Up: At [facility], you and your health needs are our priority.  As part of our continuing mission to provide you with exceptional heart care, our providers are all part of one team.  This team includes your primary Cardiologist (physician) and Advanced Practice Providers or APPs (Physician Assistants and Nurse Practitioners) who all work together to provide you with the care you need, when you need it.  Your next appointment:   6 month(s)  Provider:   Arun K Thukkani, MD     Thank you for choosing [facility]!!   [phone]

## 2024-02-13 ENCOUNTER — Ambulatory Visit: Payer: Self-pay | Admitting: Internal Medicine

## 2024-02-13 LAB — HIGH SENSITIVITY CRP: CRP, High Sensitivity: 4.2 mg/L — ABNORMAL HIGH (ref 0.00–3.00)

## 2024-02-26 ENCOUNTER — Other Ambulatory Visit: Payer: Self-pay | Admitting: Internal Medicine

## 2024-03-14 NOTE — Progress Notes (Unsigned)
 "  Cardiology Office Note:   Date:  03/14/2024  ID:  Kurt Reilly, DOB June 07, 1950, MRN [id number] PCP:  Job Lukes, PA  [facility] Providers Cardiologist:  Wendel Haws, MD Referring MD: Job Lukes, Kurt Reilly  Chief Complaint/Reason for Referral: Follow-up for coronary artery disease, ischemic cardiomyopathy ASSESSMENT:    1. Elevated C-reactive protein (CRP)   2. Coronary artery disease of native artery of native heart with stable angina pectoris   3. Chronic combined systolic and diastolic heart failure (HCC)   4. LV (left ventricular) mural thrombus   5. Hyperlipidemia LDL goal <70   6. Aortic atherosclerosis   7. Essential hypertension   8. CKD (chronic kidney disease) stage 2, GFR 60-89 ml/min         PLAN:   In order of problems listed above: Elevated hs-CRP: Patient's hs-CRP was 4.2 with LDL at goal.*** Coronary artery disease disease: Continue aspirin  81 mg, Plavix  75 mg, Praluent , and as needed nitroglycerin .   Combined systolic and diastolic heart failure: Continue Entresto  24 x 26 mm twice daily.  Continue Toprol  12.5 mg at bedtime, spironolactone  12.5 mg 3 times a week.  Continue Lasix  40 mg daily LV thrombus: Serial echograms have demonstrated no recurrence of LV thrombus.  Continue aspirin  81 mg and Plavix  75 mg.   Hyperlipidemia: LDL was 60 in March 2025; continue Praluent  150 mg every 2 weeks.  Check high-sensitivity CRP for residual inflammatory risk. Aortic atherosclerosis: Continue aspirin  81 mg and Praluent  150 mg every 2 weeks. Hypertension: Continue Entresto  24 x 26 twice daily  CKD stage II: Continue Entresto  24 x 26 mg and Jardiance  10 mg for renal protection.              Dispo:  No follow-ups on file.      Medication Adjustments/Labs and Tests Ordered: Current medicines are reviewed at length with the patient today.  Concerns regarding medicines are outlined above.  The following changes have been made:  no change   Labs/tests ordered: No  orders of the defined types were placed in this encounter.   Medication Changes: No orders of the defined types were placed in this encounter.   Current medicines are reviewed at length with the patient today.  The patient does not have concerns regarding medicines.  I spent *** minutes reviewing all clinical data during and prior to this visit including all relevant imaging studies, laboratories, clinical information from other health systems and prior notes from both Cardiology and other specialties, interviewing the patient, conducting a complete physical examination, and coordinating care in order to formulate a comprehensive and personalized evaluation and treatment plan.   History of Present Illness:      FOCUSED PROBLEM LIST:   CAD PCI of LAD and bifurcation OM left circumflex remotely LAD CTO, left circumflex CTO 2023 Heart team meeting 2023 >>> medical therapy Hs-CRP 4.05 February 2024 ICM status post ICD EF 30 to 35%, severe anterior akinesis moderate MR grade 2 diastolic dysfunction TTE 01/2023 EF 30 to 35%, severe anterior akinesis, no mural thrombus no significant valvular issues, TTE October 2025 LV thrombus LV thrombus TTE April 2024 >> Eliquis  initiated Shared decision making >> trial off anticoagulation No LV thrombus TTE August 2024 while off anticoagulation No LV thrombus TTE November 2024 off anticoagulation Hyperlipidemia LP(a) 52.8 Intolerant of statins, Repatha , and Zetia Aortic atherosclerosis Hypertension CKD stage II Palpitations Rare VT, SVT, PACs, and PVCs monitor September 2025  March 2024:  The patient is a 74 y.o. male  with the indicated medical history here for cardiology follow up.  He was seen in December in the cardiology clinic.  At that point in time he was feeling well walking on a treadmill daily for about 45 minutes.  He endorses chest pain when he walks up an incline.   The patient is here today with his wife.  When he exerts himself in  the yard such as raking leaves or uses the treadmill going up an incline he does develop angina.  This remits with rest.  He does not have any pain at rest.  He did go to [place]  recently and when he was down there after the flight he did notice some ankle edema for a day or 2.  This has not recurred.  He fortunately is not required any emergency room visits or hospitalizations.  He denies any presyncope or syncope.  He has had no severe bleeding or bruising.  He is otherwise well and tolerating his medications without significant issues.  Plan: Convert Coreg  to Toprol -XL 12.5 mg daily, increase Jardiance  to 25 mg, and obtain echocardiogram.   September 2024: The patient's Toprol  XL could not be tolerated due to low blood pressures.  He was taking this in the morning.  He was converted back to Coreg  6.25 twice daily.  The patient had an echocardiogram in April which showed possible LV thrombus.  His aspirin  and Plavix  was stopped and Eliquis  5 mg twice daily was started.  A repeat echocardiogram in August demonstrated resolution of the thrombus.  His Eliquis  was stopped and aspirin  and Plavix  restarted with a limited echocardiogram planned for 3 months with [facility]  to evaluate further.  His ICD was interrogated recently and was reassuring.   The patient is here with his wife today.  He is doing relatively well.  He is able to mow his grass without any issues with angina or dyspnea.  He has noticed some fatigue in the afternoon.  He did bring his blood pressure log in with him and his blood pressures around this time I usually relatively lower than his morning and evening blood pressures at up about 103 207 systolic.  He denies any peripheral edema, paroxysmal nocturnal dyspnea, ICD shocks, or any need for hospitalization or emergency room visit for cardiovascular issues.  He has been tolerating Praluent  well.  He has not required any nitroglycerin .  Plan: Repeat echocardiogram with Definity  while on dual  antiplatelet therapy, check lipid panel, change Coreg  6.25 mg twice daily to Toprol  12.5 mg at bedtime.  March 2024: Patient consents to use of AI scribe. In the interim the patient's LDL was 62.  Repeat echocardiogram in November with Definity  demonstrated no LV thrombus.  He experiences chest pain during exertion, particularly when using the treadmill. The pain is inducible by exertion, which he attributes to his blocked artery on the front wall of the heart. He has not used nitroglycerin  before exercising. No chest pain at rest and no shortness of breath.  He is currently on aspirin  and Plavix  without significant bleeding issues, such as epistaxis or new bruises. He takes metoprolol  at 6:30 AM and spironolactone  three times a week in the morning. He experiences occasional lightheadedness, especially when changing positions quickly, which he attributes to his medications. He is not on statins but takes Praluent  for cholesterol management. He also takes a B vitamin supplement but is unsure about its necessity.  His blood pressure at home averages 114/55, but he feels low energy in the afternoons. Occasional  lightheadedness is noted, but there are no blacking out spells or swelling in the legs.  He is on Jardiance , which causes frequent urination, and he finds it bothersome. He takes it in the morning and reports needing to use the bathroom frequently, including at night.  He experiences muscle pain without cramping and has been advised by a chiropractor to take magnesium.  He is planning a trip to [place] and [place] next year and is concerned about the possibility of blood clots during travel. He has had two echocardiograms in the past that showed no clots, which he finds reassuring.  Plan change spironolactone  to bedtime dosing due to low blood pressures, obtain contrast echocardiogram to evaluate for LV thrombus, check lipids and LFTs.  Take as needed nitroglycerin  prior to exercise.  June 2025:   Patient consents to use of AI scribe. His lipid panel was found to be at goal.  Echocardiogram demonstrated unchanged ventricular function with no recurrence of LV thrombus.  He was seen in the emergency department recently for wheezing.  He did not report any chest pain or shortness of breath.  His proBNP was elevated.  His chest x-ray showed no acute abnormalities and his EKG was reassuring.  He was given 1 dose of torsemide  and discharged home.  He called our office because his blood pressures was low.  His Entresto , metoprolol , and spironolactone  were held.  His blood pressure improved over the next few days and Entresto  low-dose was reinitiated.  He is here for follow-up.  He has been on Entresto  since the Friday prior to the visit, which has helped lower his blood pressure from the 130s to an average of 110/60 mmHg. He reports no lightheadedness but notes an increase in his pulse rate to the high 60s since discontinuing metoprolol . He is not currently taking torsemide  due to concerns about low blood pressure.  He mentions a history of flash pulmonary edema nine years ago and is concerned about the possibility of recurrence. He has not experienced significant swelling in his ankles but notes some weight loss, currently weighing around 152 pounds.  He has a history of low heart rate episodes, with occasional readings dropping to the 30s, which occurred while on metoprolol . Since discontinuing metoprolol , these episodes have not recurred. He is cautious about resuming metoprolol  due to previous issues with low heart rate and blood pressure.  He has increased his fluid intake to 2200-2500 mL per day. He monitors his weight regularly and has not needed to take torsemide  recently.  No chest pain, bleeding, swelling, or significant shortness of breath. Reports occasional wheezing and increased pulse rate.  Plan: Restart Toprol  12.5 mg at bedtime.  September 2025:  Patient consents to use of AI  scribe. The patient returns for routine follow-up.  In the interim he reported palpitations.  This demonstrated occasional PVCs.  He is here for expedited follow-up.  He experiences a 'rattling sensation' in his chest, particularly noticeable when lying flat at night, which has been progressively worsening. Elevating his head with pillows alleviates the sensation. No significant weight gain, with fluctuations of only a quarter pound, and low blood pressure readings at night, around 106/56 mmHg.  He describes episodes of shortness of breath, particularly when climbing stairs, which he previously managed without difficulty. He also reports waking up with his heart 'pounding like I just ran a marathon,' which subsides with deep breathing exercises. He has a history of extra heartbeats noted on a heart monitor and has a defibrillator in  place.  He mentions feeling full more quickly when eating. He has not taken Lasix  since his heart attack many years ago. He is concerned about the potential side effects of diuretics, including dizziness and low blood pressure, and notes that he sometimes appears 'gray' with cold extremities, which he associates with low blood pressure.  Plan: Start Lasix  40 mg daily and potassium 40 mill equivalents daily.  Obtain device interrogation and echocardiogram  December 2025:  Patient consents to use of AI scribe. The patient returns for routine follow-up.  His device interrogation demonstrated no arrhythmias.  His echocardiogram demonstrated unchanged LV function with no evidence of LV thrombus.  Since starting Lasix , he has not experienced wheezing or crackling in his lungs and notes improvement in his ability to perform activities such as yard work without significant shortness of breath. He does not experience swelling in his legs and maintains a stable weight around 151-152 pounds, which he identifies as his 'dry weight'.  He has a history of coronary artery disease with  blocked arteries on the front wall of the heart and is currently on Entresto . He describes a sensation of his heart feeling different when he temporarily stopped Entresto , but felt normal upon resuming it. He has had issues with increasing medication doses in the past. He does not experience shocks from his defibrillator.  He has a history of a heart attack and uses a wedge pillow to sleep due to nausea when lying flat. He has reduced the angle of the wedge pillow slightly and finds it comfortable. No recent increase in shortness of breath or need to adjust the wedge pillow further.  He monitors his blood pressure daily, which typically ranges from 105-110/50-55 mmHg. Occasionally, he experiences brief dizziness, occurring every four to five months, which he attributes to low blood pressure or possibly not eating enough.  He has stage two kidney disease. His liver function was last checked in June; alkaline phosphatase was slightly elevated, which has been noted in the past.  Plan: Check hs-CRP.  January 2026: Patient consents to use of AI scribe. Patient's hs-CRP was found to be 4.2.  He is here to discuss the possibility of starting low-dose colchicine .          Current Medications: No outpatient medications have been marked as taking for the 03/20/24 encounter (Appointment) with Johnluke Haugen K, MD.     Review of Systems:   Please see the history of present illness.    All other systems reviewed and are negative.     EKGs/Labs/Other Test Reviewed:   EKG: EKG from June 2026 demonstrates sinus rhythm with trigeminy nonspecific ST and T wave changes  EKG Interpretation Date/Time:    Ventricular Rate:    PR Interval:    QRS Duration:    QT Interval:    QTC Calculation:   R Axis:      Text Interpretation:           Risk Assessment/Calculations:          Physical Exam:   VS:  There were no vitals taken for this visit.   No BP recorded.  {Refresh Note OR Click here to  enter BP  :1}***   Wt Readings from Last 3 Encounters:  02/11/24 156 lb 6.4 oz (70.9 kg)  11/22/23 148 lb 3.2 oz (67.2 kg)  09/03/23 158 lb (71.7 kg)      GENERAL:  No apparent distress, AOx3 HEENT:  No carotid bruits, +2 carotid impulses, no scleral  icterus CAR: RRR no murmurs, gallops, rubs, or thrills RES:  Clear to auscultation bilaterally ABD:  Soft, nontender, nondistended, positive bowel sounds x 4 VASC:  +2 radial pulses, +2 carotid pulses NEURO:  CN 2-12 grossly intact; motor and sensory grossly intact PSYCH:  No active depression or anxiety EXT:  No edema, ecchymosis, or cyanosis  Signed, Lurena MARLA Red, MD  03/14/2024 8:06 AM    [facility] [address] [place], [place], [place]  [address] Phone: [phone]; Fax: [phone]   Note:  This document was prepared using Dragon voice recognition software and may include unintentional dictation errors. "

## 2024-03-20 ENCOUNTER — Ambulatory Visit: Attending: Internal Medicine | Admitting: Internal Medicine

## 2024-03-20 ENCOUNTER — Encounter: Payer: Self-pay | Admitting: Internal Medicine

## 2024-03-20 VITALS — BP 124/70 | HR 60 | Ht 65.0 in | Wt 158.0 lb

## 2024-03-20 DIAGNOSIS — I5042 Chronic combined systolic (congestive) and diastolic (congestive) heart failure: Secondary | ICD-10-CM | POA: Insufficient documentation

## 2024-03-20 DIAGNOSIS — I513 Intracardiac thrombosis, not elsewhere classified: Secondary | ICD-10-CM | POA: Insufficient documentation

## 2024-03-20 DIAGNOSIS — M1000 Idiopathic gout, unspecified site: Secondary | ICD-10-CM | POA: Insufficient documentation

## 2024-03-20 DIAGNOSIS — I10 Essential (primary) hypertension: Secondary | ICD-10-CM | POA: Diagnosis not present

## 2024-03-20 DIAGNOSIS — I700 Atherosclerosis of aorta: Secondary | ICD-10-CM | POA: Insufficient documentation

## 2024-03-20 DIAGNOSIS — N182 Chronic kidney disease, stage 2 (mild): Secondary | ICD-10-CM | POA: Diagnosis not present

## 2024-03-20 DIAGNOSIS — E785 Hyperlipidemia, unspecified: Secondary | ICD-10-CM | POA: Diagnosis not present

## 2024-03-20 DIAGNOSIS — I25118 Atherosclerotic heart disease of native coronary artery with other forms of angina pectoris: Secondary | ICD-10-CM | POA: Insufficient documentation

## 2024-03-20 DIAGNOSIS — R7982 Elevated C-reactive protein (CRP): Secondary | ICD-10-CM | POA: Diagnosis not present

## 2024-03-20 NOTE — Patient Instructions (Addendum)
 Medication Instructions:  No changes *If you need a refill on your cardiac medications before your next appointment, please call your pharmacy*  Lab Work: HSCRP If you have labs (blood work) drawn today and your tests are completely normal, you will receive your results only by: MyChart Message (if you have MyChart) OR A paper copy in the mail If you have any lab test that is abnormal or we need to change your treatment, we will call you to review the results.  Testing/Procedures: None ordered  Follow-Up: At [facility], you and your health needs are our priority.  As part of our continuing mission to provide you with exceptional heart care, our providers are all part of one team.  This team includes your primary Cardiologist (physician) and Advanced Practice Providers or APPs (Physician Assistants and Nurse Practitioners) who all work together to provide you with the care you need, when you need it.  Your next appointment:    In June   Provider:   Arun K Thukkani, MD    We recommend signing up for the patient portal called MyChart.  Sign up information is provided on this After Visit Summary.  MyChart is used to connect with patients for Virtual Visits (Telemedicine).  Patients are able to view lab/test results, encounter notes, upcoming appointments, etc.  Non-urgent messages can be sent to your provider as well.   To learn more about what you can do with MyChart, go to [email or web address].

## 2024-03-22 ENCOUNTER — Ambulatory Visit: Payer: Self-pay | Admitting: Internal Medicine

## 2024-03-22 LAB — HIGH SENSITIVITY CRP: CRP, High Sensitivity: 0.99 mg/L (ref 0.00–3.00)

## 2024-03-31 ENCOUNTER — Encounter: Payer: Self-pay | Admitting: Physician Assistant

## 2024-03-31 DIAGNOSIS — R200 Anesthesia of skin: Secondary | ICD-10-CM

## 2024-03-31 DIAGNOSIS — M79671 Pain in right foot: Secondary | ICD-10-CM

## 2024-04-01 NOTE — Telephone Encounter (Signed)
Okay for referral to Podiatry?

## 2024-04-09 ENCOUNTER — Ambulatory Visit: Admitting: Podiatry

## 2024-04-09 DIAGNOSIS — Q666 Other congenital valgus deformities of feet: Secondary | ICD-10-CM

## 2024-04-09 DIAGNOSIS — G629 Polyneuropathy, unspecified: Secondary | ICD-10-CM

## 2024-04-09 NOTE — Progress Notes (Unsigned)
 [facility]   Neuripahty2/2 to low back pain  referral to lateeif

## 2024-06-19 ENCOUNTER — Ambulatory Visit

## 2024-08-21 ENCOUNTER — Ambulatory Visit: Admitting: Internal Medicine
